# Patient Record
Sex: Female | Born: 1935 | Race: White | Hispanic: No | Smoking: Never smoker
Health system: Southern US, Community
[De-identification: ages and names within clinical notes are randomized; demographics above are authoritative.]

## PROBLEM LIST (undated history)

## (undated) ENCOUNTER — Encounter (HOSPITAL_COMMUNITY): Admission: RE | Payer: Medicare Other | Source: Skilled Nursing Facility | Admitting: *Deleted

## (undated) DIAGNOSIS — K222 Esophageal obstruction: Secondary | ICD-10-CM

## (undated) DIAGNOSIS — E785 Hyperlipidemia, unspecified: Secondary | ICD-10-CM

## (undated) DIAGNOSIS — K449 Diaphragmatic hernia without obstruction or gangrene: Secondary | ICD-10-CM

## (undated) DIAGNOSIS — E119 Type 2 diabetes mellitus without complications: Secondary | ICD-10-CM

## (undated) DIAGNOSIS — M1990 Unspecified osteoarthritis, unspecified site: Secondary | ICD-10-CM

## (undated) DIAGNOSIS — K210 Gastro-esophageal reflux disease with esophagitis: Secondary | ICD-10-CM

## (undated) DIAGNOSIS — D369 Benign neoplasm, unspecified site: Secondary | ICD-10-CM

## (undated) DIAGNOSIS — M810 Age-related osteoporosis without current pathological fracture: Secondary | ICD-10-CM

## (undated) DIAGNOSIS — I10 Essential (primary) hypertension: Secondary | ICD-10-CM

## (undated) HISTORY — PX: APPENDECTOMY: SHX54

## (undated) HISTORY — DX: Benign neoplasm, unspecified site: D36.9

## (undated) HISTORY — DX: Esophageal obstruction: K22.2

## (undated) HISTORY — DX: Diaphragmatic hernia without obstruction or gangrene: K44.9

## (undated) HISTORY — PX: ABDOMINAL HYSTERECTOMY: SHX81

## (undated) HISTORY — PX: BREAST BIOPSY: SHX20

## (undated) HISTORY — DX: Essential (primary) hypertension: I10

## (undated) HISTORY — PX: EYE SURGERY: SHX253

## (undated) HISTORY — DX: Age-related osteoporosis without current pathological fracture: M81.0

## (undated) HISTORY — PX: COLONOSCOPY: SHX174

## (undated) HISTORY — PX: HIP SURGERY: SHX245

## (undated) HISTORY — DX: Hyperlipidemia, unspecified: E78.5

## (undated) HISTORY — DX: Gastro-esophageal reflux disease with esophagitis: K21.0

## (undated) HISTORY — PX: RECTAL SURGERY: SHX760

## (undated) HISTORY — DX: Type 2 diabetes mellitus without complications: E11.9

## (undated) HISTORY — DX: Unspecified osteoarthritis, unspecified site: M19.90

---

## 2011-06-17 DIAGNOSIS — I10 Essential (primary) hypertension: Secondary | ICD-10-CM | POA: Diagnosis not present

## 2011-06-17 DIAGNOSIS — F411 Generalized anxiety disorder: Secondary | ICD-10-CM | POA: Diagnosis not present

## 2011-06-17 DIAGNOSIS — E785 Hyperlipidemia, unspecified: Secondary | ICD-10-CM | POA: Diagnosis not present

## 2011-06-17 DIAGNOSIS — E119 Type 2 diabetes mellitus without complications: Secondary | ICD-10-CM | POA: Diagnosis not present

## 2011-07-01 DIAGNOSIS — H353190 Nonexudative age-related macular degeneration, unspecified eye, stage unspecified: Secondary | ICD-10-CM | POA: Diagnosis not present

## 2011-07-01 DIAGNOSIS — H26499 Other secondary cataract, unspecified eye: Secondary | ICD-10-CM | POA: Diagnosis not present

## 2011-07-29 DIAGNOSIS — H26499 Other secondary cataract, unspecified eye: Secondary | ICD-10-CM | POA: Diagnosis not present

## 2011-08-06 DIAGNOSIS — E119 Type 2 diabetes mellitus without complications: Secondary | ICD-10-CM | POA: Diagnosis not present

## 2011-08-06 DIAGNOSIS — L0291 Cutaneous abscess, unspecified: Secondary | ICD-10-CM | POA: Diagnosis not present

## 2011-08-06 DIAGNOSIS — J019 Acute sinusitis, unspecified: Secondary | ICD-10-CM | POA: Diagnosis not present

## 2011-08-06 DIAGNOSIS — I10 Essential (primary) hypertension: Secondary | ICD-10-CM | POA: Diagnosis not present

## 2011-08-06 DIAGNOSIS — E785 Hyperlipidemia, unspecified: Secondary | ICD-10-CM | POA: Diagnosis not present

## 2011-08-06 DIAGNOSIS — R413 Other amnesia: Secondary | ICD-10-CM | POA: Diagnosis not present

## 2011-08-07 DIAGNOSIS — R413 Other amnesia: Secondary | ICD-10-CM | POA: Diagnosis not present

## 2011-08-12 DIAGNOSIS — H26499 Other secondary cataract, unspecified eye: Secondary | ICD-10-CM | POA: Diagnosis not present

## 2011-08-13 DIAGNOSIS — I10 Essential (primary) hypertension: Secondary | ICD-10-CM | POA: Diagnosis not present

## 2011-08-13 DIAGNOSIS — F411 Generalized anxiety disorder: Secondary | ICD-10-CM | POA: Diagnosis not present

## 2011-08-13 DIAGNOSIS — F0390 Unspecified dementia without behavioral disturbance: Secondary | ICD-10-CM | POA: Diagnosis not present

## 2011-09-02 DIAGNOSIS — Z79899 Other long term (current) drug therapy: Secondary | ICD-10-CM | POA: Diagnosis not present

## 2011-09-02 DIAGNOSIS — M353 Polymyalgia rheumatica: Secondary | ICD-10-CM | POA: Diagnosis not present

## 2011-09-02 DIAGNOSIS — E119 Type 2 diabetes mellitus without complications: Secondary | ICD-10-CM | POA: Diagnosis not present

## 2011-09-02 DIAGNOSIS — E782 Mixed hyperlipidemia: Secondary | ICD-10-CM | POA: Diagnosis not present

## 2011-09-02 DIAGNOSIS — E1142 Type 2 diabetes mellitus with diabetic polyneuropathy: Secondary | ICD-10-CM | POA: Diagnosis not present

## 2011-09-02 DIAGNOSIS — E559 Vitamin D deficiency, unspecified: Secondary | ICD-10-CM | POA: Diagnosis not present

## 2011-09-02 DIAGNOSIS — R5381 Other malaise: Secondary | ICD-10-CM | POA: Diagnosis not present

## 2011-09-14 DIAGNOSIS — Z79899 Other long term (current) drug therapy: Secondary | ICD-10-CM | POA: Diagnosis not present

## 2011-11-09 DIAGNOSIS — E119 Type 2 diabetes mellitus without complications: Secondary | ICD-10-CM | POA: Diagnosis not present

## 2011-11-09 DIAGNOSIS — I10 Essential (primary) hypertension: Secondary | ICD-10-CM | POA: Diagnosis not present

## 2011-11-09 DIAGNOSIS — N390 Urinary tract infection, site not specified: Secondary | ICD-10-CM | POA: Diagnosis not present

## 2011-11-09 DIAGNOSIS — I119 Hypertensive heart disease without heart failure: Secondary | ICD-10-CM | POA: Diagnosis not present

## 2011-11-09 DIAGNOSIS — R609 Edema, unspecified: Secondary | ICD-10-CM | POA: Diagnosis not present

## 2011-11-09 DIAGNOSIS — I509 Heart failure, unspecified: Secondary | ICD-10-CM | POA: Diagnosis not present

## 2011-11-11 DIAGNOSIS — H40019 Open angle with borderline findings, low risk, unspecified eye: Secondary | ICD-10-CM | POA: Diagnosis not present

## 2011-11-18 DIAGNOSIS — I119 Hypertensive heart disease without heart failure: Secondary | ICD-10-CM | POA: Diagnosis not present

## 2011-11-18 DIAGNOSIS — I6529 Occlusion and stenosis of unspecified carotid artery: Secondary | ICD-10-CM | POA: Diagnosis not present

## 2011-11-18 DIAGNOSIS — R079 Chest pain, unspecified: Secondary | ICD-10-CM | POA: Diagnosis not present

## 2011-11-18 DIAGNOSIS — I080 Rheumatic disorders of both mitral and aortic valves: Secondary | ICD-10-CM | POA: Diagnosis not present

## 2011-11-18 DIAGNOSIS — R269 Unspecified abnormalities of gait and mobility: Secondary | ICD-10-CM | POA: Diagnosis not present

## 2011-11-18 DIAGNOSIS — I10 Essential (primary) hypertension: Secondary | ICD-10-CM | POA: Diagnosis not present

## 2012-01-28 DIAGNOSIS — E785 Hyperlipidemia, unspecified: Secondary | ICD-10-CM | POA: Diagnosis not present

## 2012-01-28 DIAGNOSIS — M810 Age-related osteoporosis without current pathological fracture: Secondary | ICD-10-CM | POA: Diagnosis not present

## 2012-01-28 DIAGNOSIS — E119 Type 2 diabetes mellitus without complications: Secondary | ICD-10-CM | POA: Diagnosis not present

## 2012-01-28 DIAGNOSIS — I10 Essential (primary) hypertension: Secondary | ICD-10-CM | POA: Diagnosis not present

## 2012-02-25 DIAGNOSIS — E119 Type 2 diabetes mellitus without complications: Secondary | ICD-10-CM | POA: Diagnosis not present

## 2012-02-25 DIAGNOSIS — I10 Essential (primary) hypertension: Secondary | ICD-10-CM | POA: Diagnosis not present

## 2012-03-31 DIAGNOSIS — E119 Type 2 diabetes mellitus without complications: Secondary | ICD-10-CM | POA: Diagnosis not present

## 2012-03-31 DIAGNOSIS — E1149 Type 2 diabetes mellitus with other diabetic neurological complication: Secondary | ICD-10-CM | POA: Diagnosis not present

## 2012-05-18 DIAGNOSIS — K2100 Gastro-esophageal reflux disease with esophagitis, without bleeding: Secondary | ICD-10-CM

## 2012-05-18 DIAGNOSIS — K222 Esophageal obstruction: Secondary | ICD-10-CM

## 2012-05-18 DIAGNOSIS — K449 Diaphragmatic hernia without obstruction or gangrene: Secondary | ICD-10-CM

## 2012-05-18 HISTORY — DX: Gastro-esophageal reflux disease with esophagitis, without bleeding: K21.00

## 2012-05-18 HISTORY — DX: Esophageal obstruction: K22.2

## 2012-05-18 HISTORY — DX: Diaphragmatic hernia without obstruction or gangrene: K44.9

## 2012-06-23 DIAGNOSIS — E119 Type 2 diabetes mellitus without complications: Secondary | ICD-10-CM | POA: Diagnosis not present

## 2012-06-23 DIAGNOSIS — E1149 Type 2 diabetes mellitus with other diabetic neurological complication: Secondary | ICD-10-CM | POA: Diagnosis not present

## 2012-07-07 DIAGNOSIS — I10 Essential (primary) hypertension: Secondary | ICD-10-CM | POA: Diagnosis not present

## 2012-07-07 DIAGNOSIS — M25559 Pain in unspecified hip: Secondary | ICD-10-CM | POA: Diagnosis not present

## 2012-07-07 DIAGNOSIS — E119 Type 2 diabetes mellitus without complications: Secondary | ICD-10-CM | POA: Diagnosis not present

## 2012-07-07 DIAGNOSIS — E785 Hyperlipidemia, unspecified: Secondary | ICD-10-CM | POA: Diagnosis not present

## 2012-07-26 ENCOUNTER — Encounter: Payer: Self-pay | Admitting: *Deleted

## 2012-07-26 DIAGNOSIS — E785 Hyperlipidemia, unspecified: Secondary | ICD-10-CM | POA: Insufficient documentation

## 2012-07-26 DIAGNOSIS — E119 Type 2 diabetes mellitus without complications: Secondary | ICD-10-CM | POA: Insufficient documentation

## 2012-07-26 DIAGNOSIS — I10 Essential (primary) hypertension: Secondary | ICD-10-CM | POA: Insufficient documentation

## 2012-07-27 ENCOUNTER — Ambulatory Visit (INDEPENDENT_AMBULATORY_CARE_PROVIDER_SITE_OTHER): Payer: Medicare Other | Admitting: Cardiovascular Disease

## 2012-07-27 ENCOUNTER — Encounter: Payer: Self-pay | Admitting: Cardiovascular Disease

## 2012-07-27 VITALS — BP 148/70 | HR 84 | Ht 59.0 in | Wt 182.0 lb

## 2012-07-27 DIAGNOSIS — E785 Hyperlipidemia, unspecified: Secondary | ICD-10-CM

## 2012-07-27 DIAGNOSIS — E119 Type 2 diabetes mellitus without complications: Secondary | ICD-10-CM

## 2012-07-27 DIAGNOSIS — R079 Chest pain, unspecified: Secondary | ICD-10-CM | POA: Diagnosis not present

## 2012-07-27 DIAGNOSIS — I10 Essential (primary) hypertension: Secondary | ICD-10-CM | POA: Diagnosis not present

## 2012-07-27 NOTE — Assessment & Plan Note (Signed)
Atypical Normal ECG Resolved Observe for now Nitro called in If she has recurrent pain can order [employer]

## 2012-07-27 NOTE — Patient Instructions (Addendum)
Your physician recommends that you schedule a follow-up appointment in: 3 months.  

## 2012-07-27 NOTE — Assessment & Plan Note (Signed)
Cholesterol is at goal.  Continue current dose of statin and diet Rx.  No myalgias or side effects.  F/U  LFT's in 6 months. No results found for this basename: LDLCALC             

## 2012-07-27 NOTE — Assessment & Plan Note (Signed)
Well controlled.  Continue current medications and low sodium Dash type diet.    

## 2012-07-27 NOTE — Assessment & Plan Note (Signed)
Discussed low carb diet.  Target hemoglobin A1c is 6.5 or less.  Continue current medications.  

## 2012-07-27 NOTE — Progress Notes (Signed)
Patient ID: Amanda Hale, female   DOB: Mar 09, 1926, 79 y.o.   MRN: [id number] 79 yo referred by Dr Margo Aye for chest pain.  CRF;s DM , elevated lipids and HTN.  2/20 seen in office and complained of atypical chest pain.  Pain at rest and with recumbency.  Had it for about 5 nights in a row.  Nothing the last 3 weeks On dexilant now.  Difficulty ambulating due to two previous right hip surgeries. Compliant with meds.  Has not had nitro No pain during day or with exertion.  Last LDL 2/14 89 and A1c was 6.3  ROS: Denies fever, malais, weight loss, blurry vision, decreased visual acuity, cough, sputum, SOB, hemoptysis, pleuritic pain, palpitaitons, heartburn, abdominal pain, melena, lower extremity edema, claudication, or rash.  All other systems reviewed and negative   General: Affect appropriate Obese white female HEENT: normal Neck supple with no adenopathy JVP normal no bruits no thyromegaly Lungs clear with no wheezing and good diaphragmatic motion Heart:  S1/S2 no murmur,rub, gallop or click PMI normal Abdomen: benighn, BS positve, no tenderness, no AAA no bruit.  No HSM or HJR Distal pulses intact with no bruits No edema Neuro non-focal Skin warm and dry No muscular weakness  Medications Current Outpatient Prescriptions  Medication Sig Dispense Refill  . bumetanide (BUMEX) 1 MG tablet Take 1 mg by mouth daily.      Jennette Banker Sodium 30-100 MG CAPS Take 1 capsule by mouth as needed.      . citalopram (CELEXA) 20 MG tablet Take 20 mg by mouth daily.      Marland Kitchen gabapentin (NEURONTIN) 300 MG capsule Take 300 mg by mouth 3 (three) times daily.      Marland Kitchen glyBURIDE (DIABETA) 5 MG tablet Take 2.5 mg by mouth daily with breakfast.      . lovastatin (MEVACOR) 40 MG tablet Take 40 mg by mouth at bedtime.      . meclizine (ANTIVERT) 25 MG tablet Take 25 mg by mouth 4 (four) times daily as needed.      . metFORMIN (GLUCOPHAGE-XR) 500 MG 24 hr tablet Take 500 mg by mouth 2 (two) times  daily.      . traMADol (ULTRAM) 50 MG tablet Take 50 mg by mouth every 6 (six) hours as needed for pain.      . Vitamin D, Ergocalciferol, (DRISDOL) 50000 UNITS CAPS Take 50,000 Units by mouth every 7 (seven) days.       No current facility-administered medications for this visit.    Allergies Review of patient's allergies indicates no known allergies.  Family History: Family History  Problem Relation Age of Onset  . Hypertension Mother   . Hypertension Son     Social History: History   Social History  . Marital Status: Unknown    Spouse Name: N/A    Number of Children: N/A  . Years of Education: N/A   Occupational History  . Not on file.   Social History Main Topics  . Smoking status: Never Smoker   . Smokeless tobacco: Not on file  . Alcohol Use: No  . Drug Use: No  . Sexually Active: Not on file   Other Topics Concern  . Not on file   Social History Narrative  . No narrative on file    Electrocardiogram: NSR rate 80 low voltage otherwise normal  Assessment and Plan

## 2012-08-11 ENCOUNTER — Encounter: Payer: Self-pay | Admitting: Gastroenterology

## 2012-08-11 ENCOUNTER — Ambulatory Visit (INDEPENDENT_AMBULATORY_CARE_PROVIDER_SITE_OTHER): Payer: Medicare Other | Admitting: Gastroenterology

## 2012-08-11 ENCOUNTER — Encounter: Payer: Self-pay | Admitting: Internal Medicine

## 2012-08-11 VITALS — BP 144/67 | HR 83 | Temp 98.0°F | Ht 59.0 in | Wt 182.2 lb

## 2012-08-11 DIAGNOSIS — R1314 Dysphagia, pharyngoesophageal phase: Secondary | ICD-10-CM

## 2012-08-11 DIAGNOSIS — Z860100 Personal history of colon polyps, unspecified: Secondary | ICD-10-CM

## 2012-08-11 DIAGNOSIS — R1319 Other dysphagia: Secondary | ICD-10-CM

## 2012-08-11 DIAGNOSIS — K5909 Other constipation: Secondary | ICD-10-CM | POA: Insufficient documentation

## 2012-08-11 DIAGNOSIS — K5900 Constipation, unspecified: Secondary | ICD-10-CM | POA: Diagnosis not present

## 2012-08-11 DIAGNOSIS — R1310 Dysphagia, unspecified: Secondary | ICD-10-CM

## 2012-08-11 MED ORDER — PEG 3350-KCL-NA BICARB-NACL 420 G PO SOLR: 4000.0000 mL | ORAL | Status: DC

## 2012-08-11 MED ORDER — POLYETHYLENE GLYCOL 3350 17 GM/SCOOP PO POWD
17.0000 g | Freq: Every day | ORAL | Status: DC
Start: 2012-08-11 — End: 2015-08-07

## 2012-08-11 NOTE — Assessment & Plan Note (Signed)
Colonoscopy in the near future.  I have discussed the risks, alternatives, benefits with regards to but not limited to the risk of reaction to medication, bleeding, infection, perforation and the patient is agreeable to proceed. Written consent to be obtained.  Patient to provide [place] with her surgeon's contact information so we can request copy of rectal surgery records.

## 2012-08-11 NOTE — Progress Notes (Signed)
CC PCP 

## 2012-08-11 NOTE — Patient Instructions (Signed)
We have scheduled you for a colonoscopy and upper endoscopy with Dr. Jena Gauss. Please see separate instructions. Please try MiraLax one capful every day to keep your bowel movements regular and soft. Prescription has been sent to your pharmacy however your insurance may require you to buy over-the-counter instead.

## 2012-08-11 NOTE — Assessment & Plan Note (Signed)
MiraLax 17 g daily.

## 2012-08-11 NOTE — Progress Notes (Signed)
Primary Care Physician:  HALL,ZACK, MD  Primary Gastroenterologist:  Michael [name], MD   Chief Complaint  Patient presents with  . Colonoscopy    HPI:  Amanda Hale is a 79 y.o. female here for consideration of a colonoscopy given the personal history of colon polyps. Patient states her last colonoscopy was about 12 years ago in [place]. She reports having multiple colonoscopies and having polyps almost every time. She also reports having some sort of surgery on her rectum. She states she had had surgery after having hemorrhoid surgery and had some sort of "grafting" done. She denies any history of cancer.  Patient complains of chronic constipation. She has taken over-the-counter laxatives on a regular basis. She generally alternates what she takes. Has a bowel movement every 2-3 days. Little bit of heartburn related to spicey foods. Complains of difficulty swallowing pupils, meat.   Current Outpatient Prescriptions  Medication Sig Dispense Refill  . bumetanide (BUMEX) 1 MG tablet Take 1 mg by mouth daily.      . Casanthranol-Docusate Sodium 30-100 MG CAPS Take 1 capsule by mouth as needed.      . citalopram (CELEXA) 20 MG tablet Take 20 mg by mouth daily.      . gabapentin (NEURONTIN) 300 MG capsule Take 300 mg by mouth 3 (three) times daily.      . glyBURIDE (DIABETA) 5 MG tablet Take 2.5 mg by mouth daily with breakfast.      . lovastatin (MEVACOR) 40 MG tablet Take 40 mg by mouth at bedtime.      . meclizine (ANTIVERT) 25 MG tablet Take 25 mg by mouth 4 (four) times daily as needed.      . metFORMIN (GLUCOPHAGE-XR) 500 MG 24 hr tablet Take 500 mg by mouth 2 (two) times daily.      . traMADol (ULTRAM) 50 MG tablet Take 50 mg by mouth every 6 (six) hours as needed for pain.      . Vitamin D, Ergocalciferol, (DRISDOL) 50000 UNITS CAPS Take 50,000 Units by mouth every 7 (seven) days.      . polyethylene glycol powder (GLYCOLAX/MIRALAX) powder Take 17 g by mouth daily.  527 g  11  .  polyethylene glycol-electrolytes (TRILYTE) 420 G solution Take 4,000 mLs by mouth as directed.  4000 mL  0   No current facility-administered medications for this visit.    Allergies as of 08/11/2012  . (No Known Allergies)    Past Medical History  Diagnosis Date  . HTN (hypertension)   . Dyslipidemia   . DM type 2 (diabetes mellitus, type 2)     Past Surgical History  Procedure Laterality Date  . Abdominal hysterectomy    . Rectal surgery      patient states "grafting" related to hemorrhoid surgery???  . Breast biopsy    . Eye surgery      both  . Hip surgery      right x 2  . Appendectomy      Family History  Problem Relation Age of Onset  . Hypertension Mother   . Hypertension Son   . Colon cancer Neg Hx     History   Social History  . Marital Status: Unknown    Spouse Name: N/A    Number of Children: N/A  . Years of Education: N/A   Occupational History  . Not on file.   Social History Main Topics  . Smoking status: Never Smoker   . Smokeless tobacco: Not on file  .   Alcohol Use: No  . Drug Use: No  . Sexually Active: Not on file   Other Topics Concern  . Not on file   Social History Narrative  . No narrative on file      ROS:  General: Negative for anorexia, weight loss, fever, chills, fatigue, weakness. Eyes: Negative for vision changes.  ENT: Negative for hoarseness, nasal congestion. CV: Negative for chest pain, angina, palpitations, dyspnea on exertion, peripheral edema.  Respiratory: Negative for dyspnea at rest, dyspnea on exertion, cough, sputum, wheezing.  GI: See history of present illness. GU:  Negative for dysuria, hematuria, urinary incontinence, urinary frequency, nocturnal urination.  MS: chronic knee and hip pain, negative for low back pain.  Derm: Negative for rash or itching.  Neuro: Negative for weakness, abnormal sensation, seizure, frequent headaches, memory loss, confusion.  Psych: Negative for anxiety, depression,  suicidal ideation, hallucinations.  Endo: Negative for unusual weight change.  Heme: Negative for bruising or bleeding. Allergy: Negative for rash or hives.    Physical Examination:  BP 144/67  Pulse 83  Temp(Src) 98 F (36.7 C) (Oral)  Ht 4' 11" (1.499 m)  Wt 182 lb 3.2 oz (82.645 kg)  BMI 36.78 kg/m2   General: Well-nourished, well-developed in no acute distress. Hard of hearing. Accompanied by son. Head: Normocephalic, atraumatic.   Eyes: Conjunctiva pink, no icterus. Mouth: Oropharyngeal mucosa moist and pink , no lesions erythema or exudate. Neck: Supple without thyromegaly, masses, or lymphadenopathy.  Lungs: Clear to auscultation bilaterally.  Heart: Regular rate and rhythm, no murmurs rubs or gallops.  Abdomen: Bowel sounds are normal, nontender, nondistended, no hepatosplenomegaly or masses, no abdominal bruits or hernia , no rebound or guarding.   Rectal: several small skin tags noted. Normal tone. Brown stool Hemoccult negative. Extremities: 1+ pitting edema in lower extremities. No clubbing or deformities.  Neuro: Alert and oriented x 4 , grossly normal neurologically.  Skin: Warm and dry, no rash or jaundice.   Psych: Alert and cooperative, normal mood and affect.  Labs: Labs from February 2014. Sodium 139, potassium 4.1, glucose 130, BUN 14, creatinine 0.8, total bilirubin 0.6, alkaline phosphatase 75, AST 18, ALT 9, albumin 3.9, calcium 9.3.  Imaging Studies: No results found.    

## 2012-08-11 NOTE — Assessment & Plan Note (Signed)
Esophageal dysphagia to solid foods, especially meat. Offered upper endoscopy with dilation at time of colonoscopy.  I have discussed the risks, alternatives, benefits with regards to but not limited to the risk of reaction to medication, bleeding, infection, perforation and the patient is agreeable to proceed. Written consent to be obtained.

## 2012-08-12 LAB — COMPREHENSIVE METABOLIC PANEL
Alkaline Phosphatase: 75 U/L
BUN: 14 mg/dL (ref 4–21)
Calcium: 9.3 mg/dL
Glucose: 130
Potassium: 4.1 mmol/L
Sodium: 139 mmol/L (ref 137–147)

## 2012-08-16 ENCOUNTER — Telehealth: Payer: Self-pay | Admitting: Internal Medicine

## 2012-08-16 ENCOUNTER — Other Ambulatory Visit: Payer: Self-pay | Admitting: Internal Medicine

## 2012-08-16 DIAGNOSIS — Z961 Presence of intraocular lens: Secondary | ICD-10-CM | POA: Diagnosis not present

## 2012-08-16 DIAGNOSIS — R1310 Dysphagia, unspecified: Secondary | ICD-10-CM

## 2012-08-16 DIAGNOSIS — Z86010 Personal history of colonic polyps: Secondary | ICD-10-CM

## 2012-08-16 DIAGNOSIS — E119 Type 2 diabetes mellitus without complications: Secondary | ICD-10-CM | POA: Diagnosis not present

## 2012-08-16 DIAGNOSIS — K5900 Constipation, unspecified: Secondary | ICD-10-CM

## 2012-08-16 NOTE — Telephone Encounter (Signed)
I spoke to Mr. Amanda Hale in regards to moving Mrs. Amanda Hale procedure appointment to 9:45 on 04/24 due to an add on OR case and he agreed and  understood

## 2012-08-17 ENCOUNTER — Telehealth: Payer: Self-pay | Admitting: Internal Medicine

## 2012-08-17 NOTE — Telephone Encounter (Signed)
Pt is scheduled for a tcs with RMR on 4/24 and has numerous questions about her prep prescription. She can be reached at [phone]  She also signed a release of info while she was here to retrieve some records from [place]. I have not seen anything come back as of yet unless it has already been taken off the fax for the provider.

## 2012-08-17 NOTE — Telephone Encounter (Signed)
Spoke to patient and explained prep instructions again

## 2012-08-25 DIAGNOSIS — E119 Type 2 diabetes mellitus without complications: Secondary | ICD-10-CM | POA: Diagnosis not present

## 2012-08-25 DIAGNOSIS — E1149 Type 2 diabetes mellitus with other diabetic neurological complication: Secondary | ICD-10-CM | POA: Diagnosis not present

## 2012-08-25 DIAGNOSIS — H918X9 Other specified hearing loss, unspecified ear: Secondary | ICD-10-CM | POA: Diagnosis not present

## 2012-08-26 ENCOUNTER — Encounter (HOSPITAL_COMMUNITY): Payer: Self-pay | Admitting: Pharmacy Technician

## 2012-09-08 ENCOUNTER — Encounter (HOSPITAL_COMMUNITY)
Admission: RE | Disposition: A | Discharge status: Home / Self Care | Payer: Self-pay | Source: Ambulatory Visit | Attending: Internal Medicine

## 2012-09-08 ENCOUNTER — Ambulatory Visit (HOSPITAL_COMMUNITY)
Admission: RE | Admit: 2012-09-08 | Discharge: 2012-09-08 | Disposition: A | Discharge status: Home / Self Care | Payer: Medicare Other | Source: Ambulatory Visit | Attending: Internal Medicine | Admitting: Internal Medicine

## 2012-09-08 ENCOUNTER — Ambulatory Visit: Admit: 2012-09-08 | Payer: Self-pay | Admitting: Internal Medicine

## 2012-09-08 ENCOUNTER — Encounter (HOSPITAL_COMMUNITY): Payer: Self-pay

## 2012-09-08 DIAGNOSIS — Z860100 Personal history of colon polyps, unspecified: Secondary | ICD-10-CM | POA: Insufficient documentation

## 2012-09-08 DIAGNOSIS — K2970 Gastritis, unspecified, without bleeding: Secondary | ICD-10-CM | POA: Diagnosis not present

## 2012-09-08 DIAGNOSIS — K219 Gastro-esophageal reflux disease without esophagitis: Secondary | ICD-10-CM | POA: Insufficient documentation

## 2012-09-08 DIAGNOSIS — E119 Type 2 diabetes mellitus without complications: Secondary | ICD-10-CM | POA: Insufficient documentation

## 2012-09-08 DIAGNOSIS — Z01812 Encounter for preprocedural laboratory examination: Secondary | ICD-10-CM | POA: Insufficient documentation

## 2012-09-08 DIAGNOSIS — Z1211 Encounter for screening for malignant neoplasm of colon: Secondary | ICD-10-CM | POA: Diagnosis not present

## 2012-09-08 DIAGNOSIS — I10 Essential (primary) hypertension: Secondary | ICD-10-CM | POA: Insufficient documentation

## 2012-09-08 DIAGNOSIS — K222 Esophageal obstruction: Secondary | ICD-10-CM | POA: Insufficient documentation

## 2012-09-08 DIAGNOSIS — K449 Diaphragmatic hernia without obstruction or gangrene: Secondary | ICD-10-CM | POA: Diagnosis not present

## 2012-09-08 DIAGNOSIS — K2960 Other gastritis without bleeding: Secondary | ICD-10-CM

## 2012-09-08 DIAGNOSIS — D126 Benign neoplasm of colon, unspecified: Secondary | ICD-10-CM | POA: Diagnosis not present

## 2012-09-08 DIAGNOSIS — K2990 Gastroduodenitis, unspecified, without bleeding: Secondary | ICD-10-CM | POA: Diagnosis not present

## 2012-09-08 DIAGNOSIS — R1310 Dysphagia, unspecified: Secondary | ICD-10-CM

## 2012-09-08 DIAGNOSIS — K5900 Constipation, unspecified: Secondary | ICD-10-CM

## 2012-09-08 DIAGNOSIS — K210 Gastro-esophageal reflux disease with esophagitis: Secondary | ICD-10-CM

## 2012-09-08 HISTORY — PX: ESOPHAGOGASTRODUODENOSCOPY (EGD) WITH ESOPHAGEAL DILATION: SHX5812

## 2012-09-08 HISTORY — PX: COLONOSCOPY: SHX5424

## 2012-09-08 SURGERY — COLONOSCOPY
Anesthesia: Moderate Sedation

## 2012-09-08 MED ORDER — MIDAZOLAM HCL 5 MG/5ML IJ SOLN
INTRAMUSCULAR | Status: AC
  Filled 2012-09-08: qty 10

## 2012-09-08 MED ORDER — MIDAZOLAM HCL 5 MG/5ML IJ SOLN
INTRAMUSCULAR | Status: DC | PRN
  Administered 2012-09-08: 2 mg via INTRAVENOUS
  Administered 2012-09-08 (×6): 1 mg via INTRAVENOUS
  Administered 2012-09-08: 2 mg via INTRAVENOUS

## 2012-09-08 MED ORDER — MEPERIDINE HCL 100 MG/ML IJ SOLN
INTRAMUSCULAR | Status: DC | PRN
  Administered 2012-09-08 (×2): 25 mg via INTRAVENOUS
  Administered 2012-09-08: 50 mg via INTRAVENOUS
  Administered 2012-09-08 (×3): 25 mg via INTRAVENOUS

## 2012-09-08 MED ORDER — STERILE WATER FOR IRRIGATION IR SOLN
Status: DC | PRN
  Administered 2012-09-08: 10:00:00

## 2012-09-08 MED ORDER — MEPERIDINE HCL 100 MG/ML IJ SOLN
INTRAMUSCULAR | Status: AC
  Filled 2012-09-08: qty 1

## 2012-09-08 MED ORDER — ONDANSETRON HCL 4 MG/2ML IJ SOLN
INTRAMUSCULAR | Status: AC
  Filled 2012-09-08: qty 2

## 2012-09-08 MED ORDER — ONDANSETRON HCL 4 MG/2ML IJ SOLN
INTRAMUSCULAR | Status: DC | PRN
  Administered 2012-09-08: 4 mg via INTRAVENOUS

## 2012-09-08 MED ORDER — BUTAMBEN-TETRACAINE-BENZOCAINE 2-2-14 % EX AERO
INHALATION_SPRAY | CUTANEOUS | Status: DC | PRN
  Administered 2012-09-08: 2 via TOPICAL

## 2012-09-08 MED ORDER — SODIUM CHLORIDE 0.9 % IV SOLN
INTRAVENOUS | Status: DC
  Administered 2012-09-08: 09:00:00 via INTRAVENOUS

## 2012-09-08 NOTE — Op Note (Signed)
[facility] [address] [place], [address]   ENDOSCOPY PROCEDURE REPORT  PATIENT: Amanda Hale, Amanda Hale  MR#: [id number] BIRTHDATE: 28-Aug-1925 , 87  yrs. old GENDER: Female ENDOSCOPIST: R.  Roetta Sessions, MD FACP FACG REFERRED BY:  Catalina Pizza, M.D. PROCEDURE DATE:  09/08/2012 PROCEDURE:     EGD with Elease Hashimoto dilation gastric biopsy  INDICATIONS:     Esophageal dysphagia ; GERD  INFORMED CONSENT:   The risks, benefits, limitations, alternatives and imponderables have been discussed.  The potential for biopsy, esophogeal dilation, etc. have also been reviewed.  Questions have been answered.  All parties agreeable.  Please see the history and physical in the medical record for more information.  MEDICATIONS:    Versed 7 mg IV and Demerol 100 mg IV in divided doses. Zofran 4 mg IV. Cetacaine spray.  DESCRIPTION OF PROCEDURE:   The EG-[id number] ([id number])  endoscope was introduced through the mouth and advanced to the second portion of the duodenum without difficulty or limitations.  The mucosal surfaces were surveyed very carefully during advancement of the scope and upon withdrawal.  Retroflexion view of the proximal stomach and esophagogastric junction was performed.      FINDINGS: Prominent Schatzki's ring with single overlying distal esophageal erosion. No Barrett's esophagus. Stomach empty. 3 cm hiatal hernia. Multiple antral erosions. Deformity and some stenosis of the pyloric channel. No ulcer or infiltrating process. Pylorus dilated with passage of scope. Examination of the bulb revealed multiple erosions. No ulcer. The second portion appeared normal.  THERAPEUTIC / DIAGNOSTIC MANEUVERS PERFORMED:  A 54 French Maloney dilator was passed to full insertion easily. A look back revealed nice  disruption of the ring with a superficial tear through the UES mucosa as well. Subsequently, biopsies of the abnormal gastric mucosa taken for histologic  study   COMPLICATIONS:  None  IMPRESSION:     Schatzki's ring status post dilation. Erosive reflux esophagitis. Hiatal hernia. Gastric and bulbar erosions with mild pyloric stenosis-status post gastric biopsy  RECOMMENDATIONS:   Begin Protonix 40 mg daily. Followup on pathology. See EGD report. Chloraseptic as needed for a sore throat over the next couple of days.    _______________________________ R. Roetta Sessions, MD FACP [facility] eSigned:  R. Roetta Sessions, MD FACP [facility] 09/08/2012 10:52 AM     CC:  PATIENT NAME:  Anora, Schwenke MR#: [id number]

## 2012-09-08 NOTE — OR Nursing (Signed)
Patient voiced concern about taking an antibiotic before her procedure because she has had two hip surgery's.  Spoke with Dr. Jena Gauss about concerns and told patient his recommendations for this procedure. Patient was satisfied with his response

## 2012-09-08 NOTE — H&P (View-Only) (Signed)
Primary Care Physician:  Dwana Melena, MD  Primary Gastroenterologist:  Roetta Sessions, MD   Chief Complaint  Patient presents with  . Colonoscopy    HPI:  Amanda Hale is a 79 y.o. female here for consideration of a colonoscopy given the personal history of colon polyps. Patient states her last colonoscopy was about 12 years ago in [place]. She reports having multiple colonoscopies and having polyps almost every time. She also reports having some sort of surgery on her rectum. She states she had had surgery after having hemorrhoid surgery and had some sort of "grafting" done. She denies any history of cancer.  Patient complains of chronic constipation. She has taken over-the-counter laxatives on a regular basis. She generally alternates what she takes. Has a bowel movement every 2-3 days. Little bit of heartburn related to spicey foods. Complains of difficulty swallowing pupils, meat.   Current Outpatient Prescriptions  Medication Sig Dispense Refill  . bumetanide (BUMEX) 1 MG tablet Take 1 mg by mouth daily.      Jennette Banker Sodium 30-100 MG CAPS Take 1 capsule by mouth as needed.      . citalopram (CELEXA) 20 MG tablet Take 20 mg by mouth daily.      Marland Kitchen gabapentin (NEURONTIN) 300 MG capsule Take 300 mg by mouth 3 (three) times daily.      Marland Kitchen glyBURIDE (DIABETA) 5 MG tablet Take 2.5 mg by mouth daily with breakfast.      . lovastatin (MEVACOR) 40 MG tablet Take 40 mg by mouth at bedtime.      . meclizine (ANTIVERT) 25 MG tablet Take 25 mg by mouth 4 (four) times daily as needed.      . metFORMIN (GLUCOPHAGE-XR) 500 MG 24 hr tablet Take 500 mg by mouth 2 (two) times daily.      . traMADol (ULTRAM) 50 MG tablet Take 50 mg by mouth every 6 (six) hours as needed for pain.      . Vitamin D, Ergocalciferol, (DRISDOL) 50000 UNITS CAPS Take 50,000 Units by mouth every 7 (seven) days.      . polyethylene glycol powder (GLYCOLAX/MIRALAX) powder Take 17 g by mouth daily.  527 g  11  .  polyethylene glycol-electrolytes (TRILYTE) 420 G solution Take 4,000 mLs by mouth as directed.  4000 mL  0   No current facility-administered medications for this visit.    Allergies as of 08/11/2012  . (No Known Allergies)    Past Medical History  Diagnosis Date  . HTN (hypertension)   . Dyslipidemia   . DM type 2 (diabetes mellitus, type 2)     Past Surgical History  Procedure Laterality Date  . Abdominal hysterectomy    . Rectal surgery      patient states "grafting" related to hemorrhoid surgery???  . Breast biopsy    . Eye surgery      both  . Hip surgery      right x 2  . Appendectomy      Family History  Problem Relation Age of Onset  . Hypertension Mother   . Hypertension Son   . Colon cancer Neg Hx     History   Social History  . Marital Status: Unknown    Spouse Name: N/A    Number of Children: N/A  . Years of Education: N/A   Occupational History  . Not on file.   Social History Main Topics  . Smoking status: Never Smoker   . Smokeless tobacco: Not on file  .  Alcohol Use: No  . Drug Use: No  . Sexually Active: Not on file   Other Topics Concern  . Not on file   Social History Narrative  . No narrative on file      ROS:  General: Negative for anorexia, weight loss, fever, chills, fatigue, weakness. Eyes: Negative for vision changes.  ENT: Negative for hoarseness, nasal congestion. CV: Negative for chest pain, angina, palpitations, dyspnea on exertion, peripheral edema.  Respiratory: Negative for dyspnea at rest, dyspnea on exertion, cough, sputum, wheezing.  GI: See history of present illness. GU:  Negative for dysuria, hematuria, urinary incontinence, urinary frequency, nocturnal urination.  MS: chronic knee and hip pain, negative for low back pain.  Derm: Negative for rash or itching.  Neuro: Negative for weakness, abnormal sensation, seizure, frequent headaches, memory loss, confusion.  Psych: Negative for anxiety, depression,  suicidal ideation, hallucinations.  Endo: Negative for unusual weight change.  Heme: Negative for bruising or bleeding. Allergy: Negative for rash or hives.    Physical Examination:  BP 144/67  Pulse 83  Temp(Src) 98 F (36.7 C) (Oral)  Ht 4\' 11"  (1.499 m)  Wt 182 lb 3.2 oz (82.645 kg)  BMI 36.78 kg/m2   General: Well-nourished, well-developed in no acute distress. Hard of hearing. Accompanied by son. Head: Normocephalic, atraumatic.   Eyes: Conjunctiva pink, no icterus. Mouth: Oropharyngeal mucosa moist and pink , no lesions erythema or exudate. Neck: Supple without thyromegaly, masses, or lymphadenopathy.  Lungs: Clear to auscultation bilaterally.  Heart: Regular rate and rhythm, no murmurs rubs or gallops.  Abdomen: Bowel sounds are normal, nontender, nondistended, no hepatosplenomegaly or masses, no abdominal bruits or hernia , no rebound or guarding.   Rectal: several small skin tags noted. Normal tone. Brown stool Hemoccult negative. Extremities: 1+ pitting edema in lower extremities. No clubbing or deformities.  Neuro: Alert and oriented x 4 , grossly normal neurologically.  Skin: Warm and dry, no rash or jaundice.   Psych: Alert and cooperative, normal mood and affect.  Labs: Labs from February 2014. Sodium 139, potassium 4.1, glucose 130, BUN 14, creatinine 0.8, total bilirubin 0.6, alkaline phosphatase 75, AST 18, ALT 9, albumin 3.9, calcium 9.3.  Imaging Studies: No results found.

## 2012-09-08 NOTE — Interval H&P Note (Signed)
History and Physical Interval Note:  09/08/2012 10:04 AM  Amanda Hale  has presented today for surgery, with the diagnosis of ESOPHAGEAL DYSPHAGIA, CONSTIPATION AND PERSONAL HX OF COLONIC POLYPS  The various methods of treatment have been discussed with the patient and family. After consideration of risks, benefits and other options for treatment, the patient has consented to  Procedure(s) with comments: COLONOSCOPY (N/A) - 9:45 ESOPHAGOGASTRODUODENOSCOPY (EGD) WITH ESOPHAGEAL DILATION (N/A) as a surgical intervention .  The patient's history has been reviewed, patient examined, no change in status, stable for surgery.  I have reviewed the patient's chart and labs.  Questions were answered to the patient's satisfaction.     Amanda Hale  Patient reports reflux symptoms 2-3 times a week depending on dietary indiscretion. Also has pill and solid food dysphagia. Previously on acid suppression therapy but none past couple of years. No prior upper GI tract evaluation. Otherwise, no change. EGD with dilation as appropriate and colonoscopy now being performed.The risks, benefits, limitations, imponderables and alternatives regarding both EGD and colonoscopy have been reviewed with the patient. Questions have been answered. All parties agreeable.

## 2012-09-08 NOTE — Op Note (Signed)
[facility] [address] [place], [address]   COLONOSCOPY PROCEDURE REPORT  PATIENT: Amanda Hale, Amanda Hale  MR#:         [id number] BIRTHDATE: August 10, 1925 , 87  yrs. old GENDER: Female ENDOSCOPIST: R.  Roetta Sessions, MD FACP FACG REFERRED BY:  Catalina Pizza, M.D. PROCEDURE DATE:  09/08/2012 PROCEDURE:     Colonoscopy with biopsy  INDICATIONS: History colonic polyps in the distant past (elsewhere)  INFORMED CONSENT:  The risks, benefits, alternatives and imponderables including but not limited to bleeding, perforation as well as the possibility of a missed lesion have been reviewed.  The potential for biopsy, lesion removal, etc. have also been discussed.  Questions have been answered.  All parties agreeable. Please see the history and physical in the medical record for more information.  MEDICATIONS:   Versed 10 mg IV and Demerol 175 mg IV in divided doses. Zofran 4 mg daily.  DESCRIPTION OF PROCEDURE:  After a digital rectal exam was performed, the EC-[id number] ([id number])  colonoscope was advanced from the anus through the rectum and colon to the area of the cecum, ileocecal valve and appendiceal orifice.  The cecum was deeply intubated.  These structures were well-seen and photographed for the record.  From the level of the cecum and ileocecal valve, the scope was slowly and cautiously withdrawn.  The mucosal surfaces were carefully surveyed utilizing scope tip deflection to facilitate fold flattening as needed.  The scope was pulled down into the rectum where a thorough examination including retroflexion was performed.    FINDINGS:    Inadequate preparation.for polyp detection (patient's son states patient was noncompliant with prep instructions). Normal rectum. Over (1) 4 mm polyp in the descending colon; otherwise, the colonic is progressively normal. However poor prep may have obscured a small or flat lesions.  THERAPEUTIC / DIAGNOSTIC MANEUVERS PERFORMED:  The  ascending colon polyp was  cold biopsied/removed  COMPLICATIONS: none  CECAL WITHDRAWAL TIME:  14 minutes  IMPRESSION:  Colonic polyp-removed as described above. Inadequate preparation compromised examination  RECOMMENDATIONS:   Follow up on pathology.  See EGD report   _______________________________ eSigned:  R. Roetta Sessions, MD FACP [facility] 09/08/2012 11:47 AM   CC:

## 2012-09-09 ENCOUNTER — Encounter: Payer: Self-pay | Admitting: Internal Medicine

## 2012-09-12 ENCOUNTER — Encounter (HOSPITAL_COMMUNITY): Payer: Self-pay | Admitting: Internal Medicine

## 2012-09-22 ENCOUNTER — Ambulatory Visit (INDEPENDENT_AMBULATORY_CARE_PROVIDER_SITE_OTHER): Payer: Medicare Other | Admitting: Otolaryngology

## 2012-09-22 DIAGNOSIS — H903 Sensorineural hearing loss, bilateral: Secondary | ICD-10-CM | POA: Diagnosis not present

## 2012-09-22 DIAGNOSIS — J310 Chronic rhinitis: Secondary | ICD-10-CM | POA: Diagnosis not present

## 2012-09-22 DIAGNOSIS — H6120 Impacted cerumen, unspecified ear: Secondary | ICD-10-CM

## 2012-09-27 ENCOUNTER — Encounter: Payer: Self-pay | Admitting: Gastroenterology

## 2012-10-05 DIAGNOSIS — E119 Type 2 diabetes mellitus without complications: Secondary | ICD-10-CM | POA: Diagnosis not present

## 2012-10-05 DIAGNOSIS — E785 Hyperlipidemia, unspecified: Secondary | ICD-10-CM | POA: Diagnosis not present

## 2012-10-05 DIAGNOSIS — R609 Edema, unspecified: Secondary | ICD-10-CM | POA: Diagnosis not present

## 2012-10-05 DIAGNOSIS — R32 Unspecified urinary incontinence: Secondary | ICD-10-CM | POA: Diagnosis not present

## 2012-10-05 DIAGNOSIS — I10 Essential (primary) hypertension: Secondary | ICD-10-CM | POA: Diagnosis not present

## 2012-11-02 ENCOUNTER — Ambulatory Visit: Payer: Medicare Other | Admitting: Cardiovascular Disease

## 2012-11-10 DIAGNOSIS — E1149 Type 2 diabetes mellitus with other diabetic neurological complication: Secondary | ICD-10-CM | POA: Diagnosis not present

## 2012-11-10 DIAGNOSIS — E119 Type 2 diabetes mellitus without complications: Secondary | ICD-10-CM | POA: Diagnosis not present

## 2012-11-14 ENCOUNTER — Other Ambulatory Visit (HOSPITAL_COMMUNITY): Payer: Self-pay | Admitting: Internal Medicine

## 2012-11-14 DIAGNOSIS — Z139 Encounter for screening, unspecified: Secondary | ICD-10-CM

## 2012-11-24 ENCOUNTER — Ambulatory Visit (HOSPITAL_COMMUNITY)
Admission: RE | Admit: 2012-11-24 | Discharge: 2012-11-24 | Disposition: A | Discharge status: Home / Self Care | Payer: Medicare Other | Source: Ambulatory Visit | Attending: Internal Medicine | Admitting: Internal Medicine

## 2012-11-24 DIAGNOSIS — Z139 Encounter for screening, unspecified: Secondary | ICD-10-CM

## 2012-11-24 DIAGNOSIS — Z1231 Encounter for screening mammogram for malignant neoplasm of breast: Secondary | ICD-10-CM | POA: Insufficient documentation

## 2012-11-28 ENCOUNTER — Encounter: Payer: Self-pay | Admitting: Internal Medicine

## 2012-11-28 ENCOUNTER — Other Ambulatory Visit: Payer: Self-pay | Admitting: Internal Medicine

## 2012-11-28 DIAGNOSIS — R928 Other abnormal and inconclusive findings on diagnostic imaging of breast: Secondary | ICD-10-CM

## 2012-11-30 ENCOUNTER — Encounter: Payer: Self-pay | Admitting: Gastroenterology

## 2012-11-30 ENCOUNTER — Ambulatory Visit (INDEPENDENT_AMBULATORY_CARE_PROVIDER_SITE_OTHER): Payer: Medicare Other | Admitting: Gastroenterology

## 2012-11-30 VITALS — BP 130/70 | HR 88 | Temp 98.4°F | Ht 63.0 in | Wt 187.2 lb

## 2012-11-30 DIAGNOSIS — R1310 Dysphagia, unspecified: Secondary | ICD-10-CM

## 2012-11-30 DIAGNOSIS — Z860100 Personal history of colon polyps, unspecified: Secondary | ICD-10-CM

## 2012-11-30 DIAGNOSIS — R1314 Dysphagia, pharyngoesophageal phase: Secondary | ICD-10-CM

## 2012-11-30 NOTE — Addendum Note (Signed)
Addended by: Jennings Books on: 11/30/2012 11:51 AM   Modules accepted: Orders

## 2012-11-30 NOTE — Patient Instructions (Addendum)
We have scheduled you for an xray swallow of your esophagus. We will call with the results.  Please call us as soon as you find out the name of the medication that worked for you in the past.   We will see you back in 6 weeks!

## 2012-11-30 NOTE — Progress Notes (Signed)
Referring Provider: Catalina Pizza, MD Primary Care Physician:  Catalina Pizza, MD Primary GI: Dr. Jena Gauss   Chief Complaint  Patient presents with  . Follow-up  . Gastrophageal Reflux    HPI:   Ms. Stingley is an 79 year old female who returns today in follow-up after EGD/TCS. History of dysphagia, with Schatzki's ring dilated at time of EGD. Erosive esophagitis noted. Colonoscopy with poor prep, tubular adenoma. Needs surveillance April 2015.   States dysphagia not improved. Feels like she is worse. Notes "gurgling" in her throat. Feels like there is "something" in her cervical area. States it "bubbles" when drinking water. Swallowing makes it gurgle, then she regurgitates. 30-40 minutes later, "comes back up". While sleeping, family members can hear gurgling. Constantly burping at night. Protonix once each evening. States another reflux medication used to help, but she can't remember the name.   Past Medical History  Diagnosis Date  . HTN (hypertension)   . Dyslipidemia   . DM type 2 (diabetes mellitus, type 2)     Past Surgical History  Procedure Laterality Date  . Abdominal hysterectomy    . Rectal surgery      prolapsed rectum/hemorrhoids  . Breast biopsy    . Eye surgery      both  . Hip surgery      right x 2  . Appendectomy    . Colonoscopy N/A 09/08/2012    RMR: tubular adenoma, poor prep, needs surveillance April 2015   . Esophagogastroduodenoscopy (egd) with esophageal dilation N/A 09/08/2012    RMR: Schatzki's ring s/p 54- F dilation, erosive esophagitis, hiatal hernia, gastric/bulb erosions with mild pyloric stenosis. Negative path  . Colonoscopy  1990/1992/1996    [facility], multiple hyperplastic polyps    Current Outpatient Prescriptions  Medication Sig Dispense Refill  . bumetanide (BUMEX) 1 MG tablet Take 1 mg by mouth daily.      Jennette Banker Sodium 30-100 MG CAPS Take 1 capsule by mouth as needed.      . citalopram (CELEXA) 20 MG  tablet Take 20 mg by mouth daily.      Marland Kitchen gabapentin (NEURONTIN) 300 MG capsule Take 300 mg by mouth 3 (three) times daily.      Marland Kitchen glyBURIDE (DIABETA) 5 MG tablet Take 2.5 mg by mouth daily with breakfast.      . lovastatin (MEVACOR) 40 MG tablet Take 40 mg by mouth at bedtime.      . meclizine (ANTIVERT) 25 MG tablet Take 25 mg by mouth 4 (four) times daily as needed.      . metFORMIN (GLUCOPHAGE-XR) 500 MG 24 hr tablet Take 500 mg by mouth 2 (two) times daily.      . pantoprazole (PROTONIX) 40 MG tablet Take 40 mg by mouth daily.      . polyethylene glycol powder (GLYCOLAX/MIRALAX) powder Take 17 g by mouth daily.  527 g  11  . traMADol (ULTRAM) 50 MG tablet Take 50 mg by mouth every 6 (six) hours as needed for pain.      . Vitamin D, Ergocalciferol, (DRISDOL) 50000 UNITS CAPS Take 50,000 Units by mouth every 7 (seven) days.       No current facility-administered medications for this visit.    Allergies as of 11/30/2012  . (No Known Allergies)    Family History  Problem Relation Age of Onset  . Hypertension Mother   . Hypertension Son   . Colon cancer Neg Hx     History   Social History  .  Marital Status: Married    Spouse Name: N/A    Number of Children: N/A  . Years of Education: N/A   Social History Main Topics  . Smoking status: Never Smoker   . Smokeless tobacco: None  . Alcohol Use: No  . Drug Use: No  . Sexually Active: None   Other Topics Concern  . None   Social History Narrative  . None    Review of Systems: Negative unless mentioned in HPI  Physical Exam: BP 130/70  Pulse 88  Temp(Src) 98.4 F (36.9 C) (Oral)  Ht 5\' 3"  (1.6 m)  Wt 187 lb 3.2 oz (84.913 kg)  BMI 33.17 kg/m2 General:   Alert and oriented. No distress noted. Pleasant and cooperative.  Head:  Normocephalic and atraumatic. Eyes:  Conjuctiva clear without scleral icterus. Neck:  Supple, without mass or thyromegaly. Heart:  S1, S2 present without murmurs, rubs, or gallops. Regular  rate and rhythm. Abdomen:  +BS, soft, non-tender and non-distended. No rebound or guarding. No HSM. Small umbilical hernia likely.  Msk:  Symmetrical without gross deformities. Normal posture. Extremities:  Left ankle/pedal edema, chronic Neurologic:  Alert and  oriented x4;  grossly normal neurologically. Skin:  Intact without significant lesions or rashes. Cervical Nodes:  No significant cervical adenopathy. Psych:  Alert and cooperative. Normal mood and affect.

## 2012-11-30 NOTE — Progress Notes (Signed)
Cc PCP 

## 2012-11-30 NOTE — Assessment & Plan Note (Signed)
79 year old female with recent dilation of Schatzki's ring, returning with persistent dysphagia, pointing to cervical area. Her symptoms are interesting, in that she notes "gurgling" and regurgitation of food at times. No improvement with Protonix; however, she states another medication has done well for her in the past. She does not remember the name, but she will be calling us with this information.  Concern for underlying motility disorder remains. Will proceed with BPE in near future. Switch PPIs in near future, once she lets [place] know what has previously worked for her; will likely advise BID. Return in 6 weeks with RMR.

## 2012-11-30 NOTE — Assessment & Plan Note (Signed)
Tubular adenoma on colonoscopy 2014. Needs surveillance in 2015 due to poor prep.

## 2012-12-07 ENCOUNTER — Other Ambulatory Visit (HOSPITAL_COMMUNITY): Payer: Self-pay | Admitting: Internal Medicine

## 2012-12-07 ENCOUNTER — Ambulatory Visit (HOSPITAL_COMMUNITY)
Admission: RE | Admit: 2012-12-07 | Discharge: 2012-12-07 | Disposition: A | Discharge status: Home / Self Care | Payer: Medicare Other | Source: Ambulatory Visit | Attending: Gastroenterology | Admitting: Gastroenterology

## 2012-12-07 ENCOUNTER — Other Ambulatory Visit: Payer: Self-pay | Admitting: Gastroenterology

## 2012-12-07 ENCOUNTER — Encounter (HOSPITAL_COMMUNITY): Payer: Self-pay

## 2012-12-07 ENCOUNTER — Ambulatory Visit (HOSPITAL_COMMUNITY)
Admission: RE | Admit: 2012-12-07 | Discharge: 2012-12-07 | Disposition: A | Discharge status: Home / Self Care | Payer: Medicare Other | Source: Ambulatory Visit | Attending: Internal Medicine | Admitting: Internal Medicine

## 2012-12-07 ENCOUNTER — Other Ambulatory Visit (HOSPITAL_COMMUNITY): Payer: Medicare Other

## 2012-12-07 DIAGNOSIS — R928 Other abnormal and inconclusive findings on diagnostic imaging of breast: Secondary | ICD-10-CM | POA: Diagnosis not present

## 2012-12-07 DIAGNOSIS — D249 Benign neoplasm of unspecified breast: Secondary | ICD-10-CM | POA: Diagnosis not present

## 2012-12-07 DIAGNOSIS — R1310 Dysphagia, unspecified: Secondary | ICD-10-CM | POA: Diagnosis not present

## 2012-12-07 DIAGNOSIS — N63 Unspecified lump in unspecified breast: Secondary | ICD-10-CM | POA: Insufficient documentation

## 2012-12-07 MED ORDER — LIDOCAINE HCL (PF) 2 % IJ SOLN
INTRAMUSCULAR | Status: AC
  Filled 2012-12-07: qty 10

## 2012-12-07 MED ORDER — LIDOCAINE HCL (PF) 2 % IJ SOLN
10.0000 mL | Freq: Once | INTRAMUSCULAR | Status: AC
  Administered 2012-12-07: 8 mL via INTRADERMAL
  Filled 2012-12-07: qty 10

## 2012-12-07 MED ORDER — DEXLANSOPRAZOLE 60 MG PO CPDR: 60.0000 mg | DELAYED_RELEASE_CAPSULE | Freq: Every day | ORAL | Status: DC

## 2012-12-07 NOTE — Progress Notes (Signed)
Quick Note:  I did not order this.  Please fax this to Dr. Catalina Pizza asap. He may already have it. ______

## 2012-12-07 NOTE — ED Notes (Addendum)
Pt for left breast biopsy with Dr.Arceo.

## 2012-12-07 NOTE — Progress Notes (Signed)
Dexilant once daily sent to pharmacy.

## 2012-12-07 NOTE — Addendum Note (Signed)
Addended by: Nira Retort on: 12/07/2012 03:34 PM   Modules accepted: Orders

## 2012-12-07 NOTE — Progress Notes (Signed)
Patient states that the medicine that worked well for reflux was Dexilant.  Routing to [employer]

## 2012-12-20 NOTE — Progress Notes (Signed)
Quick Note:  Age-related motility changes on BPE. No mass or stricture noted. However, patient was unable to swallow the tablet. May ultimately need speech pathology appointment. How is Amanda Hale doing with Dexilant? ______

## 2012-12-30 ENCOUNTER — Ambulatory Visit (INDEPENDENT_AMBULATORY_CARE_PROVIDER_SITE_OTHER): Payer: Medicare Other | Admitting: Internal Medicine

## 2012-12-30 ENCOUNTER — Encounter: Payer: Self-pay | Admitting: Internal Medicine

## 2012-12-30 VITALS — BP 142/67 | HR 85 | Temp 97.2°F | Ht 63.0 in | Wt 190.2 lb

## 2012-12-30 DIAGNOSIS — R1310 Dysphagia, unspecified: Secondary | ICD-10-CM

## 2012-12-30 DIAGNOSIS — K219 Gastro-esophageal reflux disease without esophagitis: Secondary | ICD-10-CM | POA: Diagnosis not present

## 2012-12-30 NOTE — Progress Notes (Signed)
Primary Care Physician:  Catalina Pizza, MD Primary Gastroenterologist:  Dr. Jena Gauss  Pre-Procedure History & Physical: HPI:  Amanda Hale is a 79 y.o. female here for  followup of dysphagia. Prior EGD demonstrated a Schatzki's ring dilated with a 54 [place]oney dilator. Persisting dysphagia which has more of an oropharyngeal flavor these days. Barium pill esophagram was attempted. She could not swallow the pill. Had esophageal dysmotility  - no obvious stricture. No Zenker's diverticulum. Reflux symptoms well controlled on Dexilant 60 mg daily.  Past Medical History  Diagnosis Date  . HTN (hypertension)   . Dyslipidemia   . DM type 2 (diabetes mellitus, type 2)   . Schatzki's ring 2014  . Hiatal hernia 2014  . Reflux esophagitis 2014    erosive  . Tubular adenoma     Past Surgical History  Procedure Laterality Date  . Abdominal hysterectomy    . Rectal surgery      prolapsed rectum/hemorrhoids  . Breast biopsy    . Eye surgery      both  . Hip surgery      right x 2  . Appendectomy    . Colonoscopy N/A 09/08/2012    RMR: tubular adenoma, poor prep, needs surveillance April 2015   . Esophagogastroduodenoscopy (egd) with esophageal dilation N/A 09/08/2012    RMR: Schatzki's ring s/p 54- F dilation, erosive esophagitis, hiatal hernia, gastric/bulb erosions with mild pyloric stenosis. Negative path  . Colonoscopy  1990/1992/1996    [facility], multiple hyperplastic polyps    Prior to Admission medications   Medication Sig Start Date End Date Taking? Authorizing Provider  bumetanide (BUMEX) 1 MG tablet Take 1 mg by mouth daily.   Yes Historical Provider, MD  Casanthranol-Docusate Sodium 30-100 MG CAPS Take 1 capsule by mouth as needed.   Yes Historical Provider, MD  citalopram (CELEXA) 20 MG tablet Take 20 mg by mouth daily.   Yes Historical Provider, MD  dexlansoprazole (DEXILANT) 60 MG capsule Take 1 capsule (60 mg total) by mouth daily. 12/07/12  Yes Nira Retort,  NP  gabapentin (NEURONTIN) 300 MG capsule Take 300 mg by mouth 3 (three) times daily.   Yes Historical Provider, MD  glyBURIDE (DIABETA) 5 MG tablet Take 2.5 mg by mouth daily with breakfast.   Yes Historical Provider, MD  losartan-hydrochlorothiazide (HYZAAR) 100-12.5 MG per tablet Take 1 tablet by mouth daily.   Yes Historical Provider, MD  lovastatin (MEVACOR) 40 MG tablet Take 40 mg by mouth at bedtime.   Yes Historical Provider, MD  metFORMIN (GLUCOPHAGE-XR) 500 MG 24 hr tablet Take 500 mg by mouth 2 (two) times daily.   Yes Historical Provider, MD  polyethylene glycol powder (GLYCOLAX/MIRALAX) powder Take 17 g by mouth daily. 08/11/12  Yes Tiffany Kocher, PA-C  traMADol (ULTRAM) 50 MG tablet Take 50 mg by mouth every 6 (six) hours as needed for pain.   Yes Historical Provider, MD  meclizine (ANTIVERT) 25 MG tablet Take 25 mg by mouth 4 (four) times daily as needed.    Historical Provider, MD  pantoprazole (PROTONIX) 40 MG tablet Take 40 mg by mouth daily.    Historical Provider, MD  Vitamin D, Ergocalciferol, (DRISDOL) 50000 UNITS CAPS Take 50,000 Units by mouth every 7 (seven) days.    Historical Provider, MD    Allergies as of 12/30/2012  . (No Known Allergies)    Family History  Problem Relation Age of Onset  . Hypertension Mother   . Hypertension Son   .  Colon cancer Neg Hx     History   Social History  . Marital Status: Married    Spouse Name: N/A    Number of Children: N/A  . Years of Education: N/A   Occupational History  . Not on file.   Social History Main Topics  . Smoking status: Never Smoker   . Smokeless tobacco: Not on file  . Alcohol Use: No  . Drug Use: No  . Sexual Activity: Not on file   Other Topics Concern  . Not on file   Social History Narrative  . No narrative on file    Review of Systems: See HPI, otherwise negative ROS  Physical Exam: BP 142/67  Pulse 85  Temp(Src) 97.2 F (36.2 C) (Oral)  Ht 5\' 3"  (1.6 m)  Wt 190 lb 3.2 oz  (86.274 kg)  BMI 33.7 kg/m2 General:   Elderly, frail lady accompanied by her son. Comfortable appearing. Conversant. Well oriented.  Skin:  Intact without significant lesions or rashes. Eyes:  Sclera clear, no icterus.   Conjunctiva pink. Ears:  Normal auditory acuity. Nose:  No deformity, discharge,  or lesions. Mouth:  No deformity or lesions. Abdomen: Obese. Non-distended, normal bowel sounds.  Soft and nontender without appreciable mass or hepatosplenomegaly.  Pulses:  Normal pulses noted. Extremities:  Without clubbing or edema.  Impression/Plan:  79 year old lady with GERD and dysphagia. Barium esophagram demonstrates dysmotility likely age-related. No obvious stricture. No Zenkers.  I feel we ended up with a good dilation of the Schatzki's ring previously. She does have more an oropharyngeal component to her symptoms. She may benefit from speech/swallowing evaluation.  Likely no definitive treatment for her condition.  GERD symptoms well controlled on Dexilant.   Recommendations:     Proceed with a speech therapy swallowing evaluation  Continue Align, Metamucil and Miralax daily  Would also recommend resuming Nexium for GERD (and nausea)  Colonoscopy 2016  Office visit here in 1 year

## 2012-12-30 NOTE — Patient Instructions (Addendum)
Continue Align, Metamucil and Miralax daily  Would also recommend resuming Nexium for GERD (and nausea)  Colonoscopy 2016  Office visit here in 1 year 

## 2013-01-02 ENCOUNTER — Telehealth: Payer: Self-pay | Admitting: Gastroenterology

## 2013-01-02 ENCOUNTER — Other Ambulatory Visit: Payer: Self-pay | Admitting: Internal Medicine

## 2013-01-02 ENCOUNTER — Other Ambulatory Visit: Payer: Self-pay | Admitting: Gastroenterology

## 2013-01-02 DIAGNOSIS — R1310 Dysphagia, unspecified: Secondary | ICD-10-CM

## 2013-01-02 NOTE — Telephone Encounter (Signed)
Referral has been faxed to speech pathology

## 2013-01-02 NOTE — Telephone Encounter (Signed)
Thank you :)

## 2013-01-06 ENCOUNTER — Ambulatory Visit: Payer: Medicare Other | Admitting: Internal Medicine

## 2013-01-09 DIAGNOSIS — I10 Essential (primary) hypertension: Secondary | ICD-10-CM | POA: Diagnosis not present

## 2013-01-09 DIAGNOSIS — E119 Type 2 diabetes mellitus without complications: Secondary | ICD-10-CM | POA: Diagnosis not present

## 2013-01-09 DIAGNOSIS — E785 Hyperlipidemia, unspecified: Secondary | ICD-10-CM | POA: Diagnosis not present

## 2013-01-10 ENCOUNTER — Ambulatory Visit (HOSPITAL_COMMUNITY): Payer: Medicare Other | Admitting: Speech Pathology

## 2013-01-10 ENCOUNTER — Other Ambulatory Visit (HOSPITAL_COMMUNITY): Payer: Medicare Other

## 2013-01-12 ENCOUNTER — Other Ambulatory Visit: Payer: Self-pay | Admitting: Internal Medicine

## 2013-01-12 ENCOUNTER — Ambulatory Visit (HOSPITAL_COMMUNITY)
Admission: RE | Admit: 2013-01-12 | Discharge: 2013-01-12 | Disposition: A | Discharge status: Home / Self Care | Payer: Medicare Other | Source: Ambulatory Visit | Attending: Internal Medicine | Admitting: Internal Medicine

## 2013-01-12 DIAGNOSIS — E119 Type 2 diabetes mellitus without complications: Secondary | ICD-10-CM | POA: Insufficient documentation

## 2013-01-12 DIAGNOSIS — R1310 Dysphagia, unspecified: Secondary | ICD-10-CM | POA: Insufficient documentation

## 2013-01-12 DIAGNOSIS — IMO0001 Reserved for inherently not codable concepts without codable children: Secondary | ICD-10-CM | POA: Diagnosis not present

## 2013-01-12 DIAGNOSIS — I10 Essential (primary) hypertension: Secondary | ICD-10-CM | POA: Diagnosis not present

## 2013-01-12 NOTE — Procedures (Signed)
Objective Swallowing Evaluation: Modified Barium Swallowing Study   Patient Details  Name: Amanda Hale MRN: [id number] Date of Birth: 02-Oct-1925  Today's Date: 01/12/2013 Time:  - 11:15 AM    Past Medical History:  Past Medical History  Diagnosis Date  . HTN (hypertension)   . Dyslipidemia   . DM type 2 (diabetes mellitus, type 2)   . Schatzki's ring 2014  . Hiatal hernia 2014  . Reflux esophagitis 2014    erosive  . Tubular adenoma    Past Surgical History:  Past Surgical History  Procedure Laterality Date  . Abdominal hysterectomy    . Rectal surgery      prolapsed rectum/hemorrhoids  . Breast biopsy    . Eye surgery      both  . Hip surgery      right x 2  . Appendectomy    . Colonoscopy N/A 09/08/2012    RMR: tubular adenoma, poor prep, needs surveillance April 2015   . Esophagogastroduodenoscopy (egd) with esophageal dilation N/A 09/08/2012    RMR: Schatzki's ring s/p 54- F dilation, erosive esophagitis, hiatal hernia, gastric/bulb erosions with mild pyloric stenosis. Negative path  . Colonoscopy  1990/1992/1996    [facility], multiple hyperplastic polyps   HPI:  Amanda Hale is an 79 year old woman who was referred by Dr. Jena Gauss for MBSS due to pt complaints of globus sensation and regurgitation after meals. She underwent colonoscopy and upper endoscopy with dilation in April 2014 and had barium swallow in July 2014. EGD showed: Schatzki's ring status post dilation, erosive reflux esophagitis, hiatal hernia, and gastric and bulbar erosions with mild pyloric stenosis-status postgastric biopsy. BPE completed in July showed diffuse impairment of esophageal motility with incomplete clearance of barium by primary peristaltic waves, intermittent retrograde peristalsis, and prolonged retention of contrast. Pt was unable to swallow the barium tablet during that exam.   Symptoms/Limitations Symptoms: Amanda Hale was accompanied by her daughter and reports  globus sensation in cervical region. Special Tests: MBSS  Recommendation/Prognosis  Clinical Impression Dysphagia Diagnosis: Within Functional Limits  Clinical impression: Oropharyngeal swallow is essentially [facility] with the exception of pt having a difficult time propelling pills posteriorly (this is baseline per patient), mild decreased velopharyngeal closure. No penetration or aspiration was observed. Pt with trace amount of residuals in vallecular space which clear with repeat swallow. Pt continues to report globus sensation in cervical region, however no objective correlate found, I spoke with the patient and her daughter at length in regards to results of MBSS and possibilities for her symtpoms. Her daughter reports that pt wakes up gasping for breath during the night and the pt has no recollection of this. I wonder whether symptoms are related to reflux (she has been taking Dexilant for about a month once per day per pt). Reflux precautions were reviewed with pt/family. She currently does not elevate HOB, but sleeps on two pillows. Consider changing reflux meds to BID and elevating HOB. Pt had a sleep study over 5 years ago and perhaps she needs another given night time waking per daughter.   Swallow Evaluation Recommendations Diet Recommendations: Regular;Thin liquid Liquid Administration via: Cup;Straw Medication Administration: Whole meds with liquid (or whole with puree) Supervision: Patient able to self feed Postural Changes and/or Swallow Maneuvers: Out of bed for meals;Seated upright 90 degrees;Upright 30-60 min after meal Oral Care Recommendations: Oral care BID Other Recommendations: Clarify dietary restrictions Follow up Recommendations: None   Individuals Consulted Consulted and Agree with Results and  Recommendations: Patient;Family member/caregiver Family Member Consulted: daughter Report Sent to : Referring physician    General:  Date of Onset: 09/08/12 HPI: Amanda Hale is an 79 year old woman who was referred by Dr. Jena Gauss for MBSS due to pt complaints of globus sensation and regurgitation after meals. She underwent colonoscopy and upper endoscopy with dilation in April 2014 and had barium swallow in July 2014. EGD showed: Schatzki's ring status post dilation, erosive reflux esophagitis, hiatal hernia, and gastric and bulbar erosions with mild pyloric stenosis-status postgastric biopsy. BPE completed in July showed diffuse impairment of esophageal motility with incomplete clearance of barium by primary peristaltic waves, intermittent retrograde peristalsis, and prolonged retention of contrast. Pt was unable to swallow the barium tablet during that exam.  Type of Study: Modified Barium Swallowing Study Reason for Referral: Objectively evaluate swallowing function Previous Swallow Assessment: BPE in July 2014- age related impairment of esophageal motility with incomplete clearance with primary peristaltic wave.  Diet Prior to this Study: Regular;Thin liquids Temperature Spikes Noted: No Respiratory Status: Room air History of Recent Intubation: No Behavior/Cognition: Alert;Cooperative;Pleasant mood Oral Cavity - Dentition: Adequate natural dentition Oral Motor / Sensory Function: Within functional limits Self-Feeding Abilities: Able to feed self Patient Positioning: Upright in chair Baseline Vocal Quality: Clear Volitional Cough: Strong Volitional Swallow: Able to elicit Anatomy: Within functional limits Pharyngeal Secretions: Not observed secondary MBS  Reason for Referral:  Objectively evaluate swallowing function   Oral Phase Oral Preparation/Oral Phase Oral Phase: Impaired Oral - Solids Oral - Pill: Reduced posterior propulsion;Piecemeal swallowing;Delayed oral transit;Decreased velopharyngeal closure Oral Phase - Comment Oral Phase - Comment: Pt had difficulty with anterior-posterior transit of barium tablet, however with multiple  sips/swallows pt was able to swallow (once she got the pill near faucial pillars she had no difficulty). Pt appeared to have slight decrease in velopharyngeal closure during the swallow with liquids and puree (trace amount of contrast could be seen heading above uvulae during the swallow). Pharyngeal Phase  Pharyngeal Phase Pharyngeal Phase: Impaired Pharyngeal - Thin Pharyngeal - Thin Cup: Premature spillage to valleculae;Pharyngeal residue - valleculae Pharyngeal - Solids Pharyngeal - Puree: Pharyngeal residue - valleculae Pharyngeal Phase - Comment Pharyngeal Comment: Trace amounts of residuals noted in valleculae with puree and thin liquids which clear with repeat swallow. Cervical Esophageal Phase  Cervical Esophageal Phase Cervical Esophageal Phase: Impaired Cervical Esophageal Phase - Solids Puree: Prominent cricopharyngeal segment Cervical Esophageal Phase - Comment Cervical Esophageal Comment: delayed emptying in distal esophagus which supports findings of Barium Swallow (diffuse impairment of esophageal motility with incomplete clearance of barium by primary peristaltic waves)   G-Codes Functional Assessment Tool Used: MBSS Functional Limitations: Swallowing Swallow Current Status (Z6109): At least 1 percent but less than 20 percent impaired, limited or restricted Swallow Goal Status [phone]): At least 1 percent but less than 20 percent impaired, limited or restricted Swallow Discharge Status [phone]): At least 1 percent but less than 20 percent impaired, limited or restricted   Thank you,  Havery Moros, CCC-SLP [phone]  PORTER,DABNEY 01/12/2013, 2:10 PM

## 2013-01-13 DIAGNOSIS — E119 Type 2 diabetes mellitus without complications: Secondary | ICD-10-CM | POA: Diagnosis not present

## 2013-01-13 DIAGNOSIS — I10 Essential (primary) hypertension: Secondary | ICD-10-CM | POA: Diagnosis not present

## 2013-01-13 DIAGNOSIS — K219 Gastro-esophageal reflux disease without esophagitis: Secondary | ICD-10-CM | POA: Diagnosis not present

## 2013-01-19 DIAGNOSIS — L851 Acquired keratosis [keratoderma] palmaris et plantaris: Secondary | ICD-10-CM | POA: Diagnosis not present

## 2013-01-19 DIAGNOSIS — E1149 Type 2 diabetes mellitus with other diabetic neurological complication: Secondary | ICD-10-CM | POA: Diagnosis not present

## 2013-01-19 DIAGNOSIS — E119 Type 2 diabetes mellitus without complications: Secondary | ICD-10-CM | POA: Diagnosis not present

## 2013-01-19 DIAGNOSIS — L609 Nail disorder, unspecified: Secondary | ICD-10-CM | POA: Diagnosis not present

## 2013-02-09 ENCOUNTER — Telehealth: Payer: Self-pay | Admitting: Gastroenterology

## 2013-02-09 NOTE — Telephone Encounter (Signed)
Tried to call pt- NA 

## 2013-02-09 NOTE — Telephone Encounter (Signed)
I see where patient has seen speech pathology.   Let's call/mail the GERD precautions to her. Recommend elevating HOB. See how she is doing. Has an appt with Amanda Hale in October.

## 2013-02-10 NOTE — Telephone Encounter (Signed)
gerd info in mail to pt

## 2013-02-14 NOTE — Telephone Encounter (Signed)
She needs to elevate the actual head of the bed. Don't eat for at least 3-4 hours before bedtime. Is she doing this?  Take Dexilant in the morning and Zantac at night as she is doing for now.  Has appt with Verlon Au upcoming. May need tertiary referral if severe symptoms continue.

## 2013-02-14 NOTE — Telephone Encounter (Signed)
Tried to call pt- LMOM 

## 2013-02-14 NOTE — Telephone Encounter (Signed)
Spoke with pts son- Deniece Portela- she is following recommendations but ever since the egd/ed, she has had a lot of coughing in the late evenings and at night. They took her to see Dr. Margo Aye and he doesn't feel like it is a virus, he thinks it may be her reflux. He told her to take her dexilant in the morning and gave her rx for zantac to take before bedtime. She said this isnt helping and she is coughing and gaging all night with reflux. No pain or burning in chest, no fever. She is swallowing ok but after she eats it feels like it is trying to come back up sometimes.  They want to know if there is anything else they can do.

## 2013-02-16 NOTE — Telephone Encounter (Signed)
Spoke with pt- she continues to have the cough, usually just at night. She tried elevating her bed and she said it made it worse so now she sleeps on 2 pillows instead. She doesn't eat in the evenings. She will be at her appt on 02/21/13 with LSL.

## 2013-02-21 ENCOUNTER — Encounter: Payer: Self-pay | Admitting: Gastroenterology

## 2013-02-21 ENCOUNTER — Ambulatory Visit (INDEPENDENT_AMBULATORY_CARE_PROVIDER_SITE_OTHER): Payer: Medicare Other | Admitting: Gastroenterology

## 2013-02-21 ENCOUNTER — Ambulatory Visit (HOSPITAL_COMMUNITY)
Admission: RE | Admit: 2013-02-21 | Discharge: 2013-02-21 | Disposition: A | Discharge status: Home / Self Care | Payer: Medicare Other | Source: Ambulatory Visit | Attending: Gastroenterology | Admitting: Gastroenterology

## 2013-02-21 VITALS — BP 150/84 | HR 91 | Temp 98.3°F | Ht 60.0 in | Wt 193.2 lb

## 2013-02-21 DIAGNOSIS — R05 Cough: Secondary | ICD-10-CM

## 2013-02-21 DIAGNOSIS — K224 Dyskinesia of esophagus: Secondary | ICD-10-CM | POA: Diagnosis not present

## 2013-02-21 DIAGNOSIS — R059 Cough, unspecified: Secondary | ICD-10-CM | POA: Diagnosis not present

## 2013-02-21 DIAGNOSIS — I771 Stricture of artery: Secondary | ICD-10-CM | POA: Diagnosis not present

## 2013-02-21 DIAGNOSIS — I10 Essential (primary) hypertension: Secondary | ICD-10-CM | POA: Diagnosis not present

## 2013-02-21 DIAGNOSIS — E119 Type 2 diabetes mellitus without complications: Secondary | ICD-10-CM | POA: Insufficient documentation

## 2013-02-21 NOTE — Progress Notes (Signed)
CC'd to PCP 

## 2013-02-21 NOTE — Patient Instructions (Addendum)
1. Increase Dexilant to twice daily for the next 30 days. Samples provided. You will take the second dose about a half an hour before your evening meal. 2. Stop ranitidine. 3. Chest x-ray as planned. 4. Please purchase a pillow wedge.  5. Wait three hours before laying down after your evening meal.

## 2013-02-21 NOTE — Progress Notes (Signed)
Primary Care Physician: Catalina Pizza, MD  Primary Gastroenterologist:  Roetta Sessions, MD   Chief Complaint  Patient presents with  . Follow-up  . Dysphagia    HPI: Amanda Hale is a 79 y.o. female here for f/u dysphagia, nocturnal coughing. Extensive evaluation including EGD/ED in 08/2012 with ERE, s/p dilation of Schatzki's ring, gastric/bulbar erosions with mild pyloric stenosis (biopsies were negative), barium esophagram showed age-related impairment of esophageal motility (patient could not swallow barium tablet), MBSS unremarkable.   No coughing while awake. If falls asleep reclined she coughs some. At nighttime, coughs a lot. Tried to sleep with head of bed elevated but kept sliding down. Right now sleeping on two pillows. Has to sleep in back due to hip surgeries. Daughter states patient gurgles and wet airway during sleep. Coughs up some white mucous during the night. No postnasal drip. No obvious shortness of breath although she states her mother breathes fast when she is asleep. No heartburn. Food goes down okay. Eats at 7pm and lays down at 9pm. No weight loss. Bowel movements okay.  Current Outpatient Prescriptions  Medication Sig Dispense Refill  . bumetanide (BUMEX) 1 MG tablet Take 1 mg by mouth daily.      Jennette Banker Sodium 30-100 MG CAPS Take 1 capsule by mouth as needed.      . citalopram (CELEXA) 20 MG tablet Take 20 mg by mouth daily.      Marland Kitchen dexlansoprazole (DEXILANT) 60 MG capsule Take 1 capsule (60 mg total) by mouth daily.  30 capsule  3  . gabapentin (NEURONTIN) 300 MG capsule Take 300 mg by mouth 3 (three) times daily.      Marland Kitchen glyBURIDE (DIABETA) 5 MG tablet Take 2.5 mg by mouth daily with breakfast.      . losartan-hydrochlorothiazide (HYZAAR) 100-12.5 MG per tablet Take 1 tablet by mouth daily.      Marland Kitchen lovastatin (MEVACOR) 40 MG tablet Take 40 mg by mouth at bedtime.      . meclizine (ANTIVERT) 25 MG tablet Take 25 mg by mouth 4 (four) times daily as  needed.      . metFORMIN (GLUCOPHAGE-XR) 500 MG 24 hr tablet Take 500 mg by mouth 2 (two) times daily.      . polyethylene glycol powder (GLYCOLAX/MIRALAX) powder Take 17 g by mouth daily.  527 g  11  . ranitidine (ZANTAC) 150 MG tablet Take 150 mg by mouth at bedtime.      . traMADol (ULTRAM) 50 MG tablet Take 50 mg by mouth every 6 (six) hours as needed for pain.      . Vitamin D, Ergocalciferol, (DRISDOL) 50000 UNITS CAPS Take 50,000 Units by mouth every 7 (seven) days.       No current facility-administered medications for this visit.    Allergies as of 02/21/2013  . (No Known Allergies)    ROS:  General: Negative for anorexia, weight loss, fever, chills, fatigue, weakness. ENT: Negative for hoarseness, difficulty swallowing , nasal congestion. CV: Negative for chest pain, angina, palpitations, dyspnea on exertion, peripheral edema.  Respiratory: Negative for dyspnea at rest, dyspnea on exertion, wheezing. See history of present illness. GI: See history of present illness. GU:  Negative for dysuria, hematuria, urinary incontinence, urinary frequency, nocturnal urination.  Endo: Negative for unusual weight change.    Physical Examination:   BP 150/84  Pulse 91  Temp(Src) 98.3 F (36.8 C) (Oral)  Ht 5' (1.524 m)  Wt 193 lb 3.2 oz (87.635 kg)  BMI  37.73 kg/m2  General: Well-nourished, well-developed in no acute distress.  Eyes: No icterus. Mouth: Oropharyngeal mucosa moist and pink , no lesions erythema or exudate. Lungs: Clear to auscultation bilaterally.  Heart: Regular rate and rhythm, no murmurs rubs or gallops.  Abdomen: Bowel sounds are normal, nontender, nondistended, no hepatosplenomegaly or masses, no abdominal bruits or hernia , no rebound or guarding.   Extremities: No lower extremity edema. No clubbing or deformities. Neuro: Alert and oriented x 4   Skin: Warm and dry, no jaundice.   Psych: Alert and cooperative, normal mood and affect.

## 2013-02-21 NOTE — Assessment & Plan Note (Addendum)
Really no difficulties with meals. She has numerous nocturnal complaints with regards to globus, nighttime coughing when she lays down. Wakes up and coughs up large amounts of sputum. Daughter witnesses this every night the patient doesn't recall it. According to her daughter, she sounds like she is gurgling prior to the cough. It is unclear whether her symptoms are related to reflux which is worsened by esophageal motility disorder. She denies any bitter taste when she coughs the sputum up. Cannot rule out orthopnea or lung issues. We will aggressively treat her reflux symptoms are for the next 30 days. Other recommendations as below.  Plan for CXR PA/laternal. Sleep on pillow wedge. Wait 3 hours after evening meal before laying down. Stop ranitidine. Increase Dexilant to BID for 30 days. Samples provided. She will call if continued symptoms. She did not want to schedule f/u OV.

## 2013-02-22 NOTE — Progress Notes (Signed)
Quick Note:  Please let patient and/or daughter know the CXR was normal. Plan as outlined in OV note.  If no improvement in two weeks, call the office. ______

## 2013-03-17 DIAGNOSIS — R5381 Other malaise: Secondary | ICD-10-CM | POA: Diagnosis not present

## 2013-03-17 DIAGNOSIS — M25569 Pain in unspecified knee: Secondary | ICD-10-CM | POA: Diagnosis not present

## 2013-03-28 DIAGNOSIS — E119 Type 2 diabetes mellitus without complications: Secondary | ICD-10-CM | POA: Diagnosis not present

## 2013-03-28 DIAGNOSIS — E1149 Type 2 diabetes mellitus with other diabetic neurological complication: Secondary | ICD-10-CM | POA: Diagnosis not present

## 2013-06-29 ENCOUNTER — Other Ambulatory Visit (HOSPITAL_COMMUNITY): Payer: Self-pay | Admitting: Internal Medicine

## 2013-06-29 DIAGNOSIS — Z09 Encounter for follow-up examination after completed treatment for conditions other than malignant neoplasm: Secondary | ICD-10-CM

## 2013-07-05 ENCOUNTER — Ambulatory Visit (HOSPITAL_COMMUNITY): Payer: Medicare Other

## 2013-07-12 ENCOUNTER — Other Ambulatory Visit (HOSPITAL_COMMUNITY): Payer: Medicare Other

## 2013-07-12 ENCOUNTER — Ambulatory Visit (HOSPITAL_COMMUNITY)
Admission: RE | Admit: 2013-07-12 | Discharge: 2013-07-12 | Disposition: A | Discharge status: Home / Self Care | Payer: Medicare Other | Source: Ambulatory Visit | Attending: Internal Medicine | Admitting: Internal Medicine

## 2013-07-12 DIAGNOSIS — Z09 Encounter for follow-up examination after completed treatment for conditions other than malignant neoplasm: Secondary | ICD-10-CM | POA: Insufficient documentation

## 2013-09-21 ENCOUNTER — Ambulatory Visit (INDEPENDENT_AMBULATORY_CARE_PROVIDER_SITE_OTHER): Payer: Medicare Other | Admitting: Otolaryngology

## 2013-09-22 DIAGNOSIS — B379 Candidiasis, unspecified: Secondary | ICD-10-CM | POA: Diagnosis not present

## 2013-09-22 DIAGNOSIS — R5381 Other malaise: Secondary | ICD-10-CM | POA: Diagnosis not present

## 2013-10-05 DIAGNOSIS — J069 Acute upper respiratory infection, unspecified: Secondary | ICD-10-CM | POA: Diagnosis not present

## 2013-10-12 DIAGNOSIS — J069 Acute upper respiratory infection, unspecified: Secondary | ICD-10-CM | POA: Diagnosis not present

## 2013-10-12 DIAGNOSIS — G3184 Mild cognitive impairment, so stated: Secondary | ICD-10-CM | POA: Diagnosis not present

## 2013-10-19 ENCOUNTER — Other Ambulatory Visit (HOSPITAL_COMMUNITY): Payer: Self-pay | Admitting: Internal Medicine

## 2013-10-19 ENCOUNTER — Ambulatory Visit (HOSPITAL_COMMUNITY)
Admission: RE | Admit: 2013-10-19 | Discharge: 2013-10-19 | Disposition: A | Discharge status: Home / Self Care | Payer: Medicare Other | Source: Ambulatory Visit | Attending: Internal Medicine | Admitting: Internal Medicine

## 2013-10-19 DIAGNOSIS — R0602 Shortness of breath: Secondary | ICD-10-CM

## 2013-10-19 DIAGNOSIS — R05 Cough: Secondary | ICD-10-CM | POA: Insufficient documentation

## 2013-10-19 DIAGNOSIS — R059 Cough, unspecified: Secondary | ICD-10-CM | POA: Insufficient documentation

## 2013-11-21 ENCOUNTER — Other Ambulatory Visit: Payer: Self-pay | Admitting: Internal Medicine

## 2013-11-21 DIAGNOSIS — N63 Unspecified lump in unspecified breast: Secondary | ICD-10-CM

## 2013-11-28 ENCOUNTER — Ambulatory Visit
Admission: RE | Admit: 2013-11-28 | Discharge: 2013-11-28 | Disposition: A | Discharge status: Home / Self Care | Payer: Medicare Other | Source: Ambulatory Visit | Attending: Internal Medicine | Admitting: Internal Medicine

## 2013-11-28 DIAGNOSIS — N63 Unspecified lump in unspecified breast: Secondary | ICD-10-CM

## 2014-02-06 DIAGNOSIS — E1149 Type 2 diabetes mellitus with other diabetic neurological complication: Secondary | ICD-10-CM | POA: Diagnosis not present

## 2014-02-06 DIAGNOSIS — E119 Type 2 diabetes mellitus without complications: Secondary | ICD-10-CM | POA: Diagnosis not present

## 2014-03-13 DIAGNOSIS — E119 Type 2 diabetes mellitus without complications: Secondary | ICD-10-CM | POA: Diagnosis not present

## 2014-03-13 DIAGNOSIS — E559 Vitamin D deficiency, unspecified: Secondary | ICD-10-CM | POA: Diagnosis not present

## 2014-03-13 DIAGNOSIS — I10 Essential (primary) hypertension: Secondary | ICD-10-CM | POA: Diagnosis not present

## 2014-03-15 DIAGNOSIS — I10 Essential (primary) hypertension: Secondary | ICD-10-CM | POA: Diagnosis not present

## 2014-03-15 DIAGNOSIS — N182 Chronic kidney disease, stage 2 (mild): Secondary | ICD-10-CM | POA: Diagnosis not present

## 2014-03-15 DIAGNOSIS — E1122 Type 2 diabetes mellitus with diabetic chronic kidney disease: Secondary | ICD-10-CM | POA: Diagnosis not present

## 2014-03-15 DIAGNOSIS — R4181 Age-related cognitive decline: Secondary | ICD-10-CM | POA: Diagnosis not present

## 2014-04-24 DIAGNOSIS — L110 Acquired keratosis follicularis: Secondary | ICD-10-CM | POA: Diagnosis not present

## 2014-04-24 DIAGNOSIS — E1151 Type 2 diabetes mellitus with diabetic peripheral angiopathy without gangrene: Secondary | ICD-10-CM | POA: Diagnosis not present

## 2014-04-24 DIAGNOSIS — L609 Nail disorder, unspecified: Secondary | ICD-10-CM | POA: Diagnosis not present

## 2014-07-10 DIAGNOSIS — E1122 Type 2 diabetes mellitus with diabetic chronic kidney disease: Secondary | ICD-10-CM | POA: Diagnosis not present

## 2014-07-10 DIAGNOSIS — I10 Essential (primary) hypertension: Secondary | ICD-10-CM | POA: Diagnosis not present

## 2014-07-11 DIAGNOSIS — E1151 Type 2 diabetes mellitus with diabetic peripheral angiopathy without gangrene: Secondary | ICD-10-CM | POA: Diagnosis not present

## 2014-07-11 DIAGNOSIS — L110 Acquired keratosis follicularis: Secondary | ICD-10-CM | POA: Diagnosis not present

## 2014-07-11 DIAGNOSIS — L609 Nail disorder, unspecified: Secondary | ICD-10-CM | POA: Diagnosis not present

## 2014-07-17 DIAGNOSIS — R4181 Age-related cognitive decline: Secondary | ICD-10-CM | POA: Diagnosis not present

## 2014-07-17 DIAGNOSIS — E1122 Type 2 diabetes mellitus with diabetic chronic kidney disease: Secondary | ICD-10-CM | POA: Diagnosis not present

## 2014-07-17 DIAGNOSIS — I10 Essential (primary) hypertension: Secondary | ICD-10-CM | POA: Diagnosis not present

## 2014-07-17 DIAGNOSIS — E785 Hyperlipidemia, unspecified: Secondary | ICD-10-CM | POA: Diagnosis not present

## 2014-07-23 IMAGING — US US BREAST LTD UNI LEFT INC AXILLA
1 series · 4 of 4 positions shown · non-contrast
Comparison: 12/07/2012

CLINICAL DATA: Followup probably benign left breast mass.

EXAM:
ULTRASOUND OF THE LEFT BREAST

[Series 1: us breast ltd uni left inc axilla · 0.04mm/px · 4 of 4 slices shown]
[im 1/4]
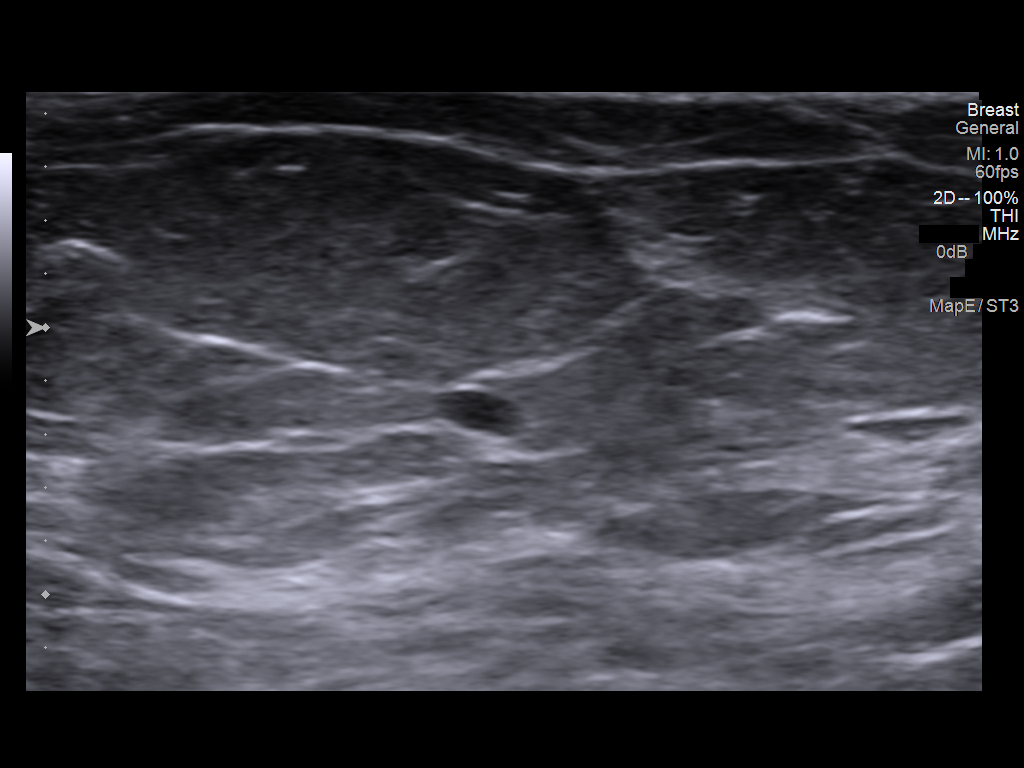
[im 2/4]
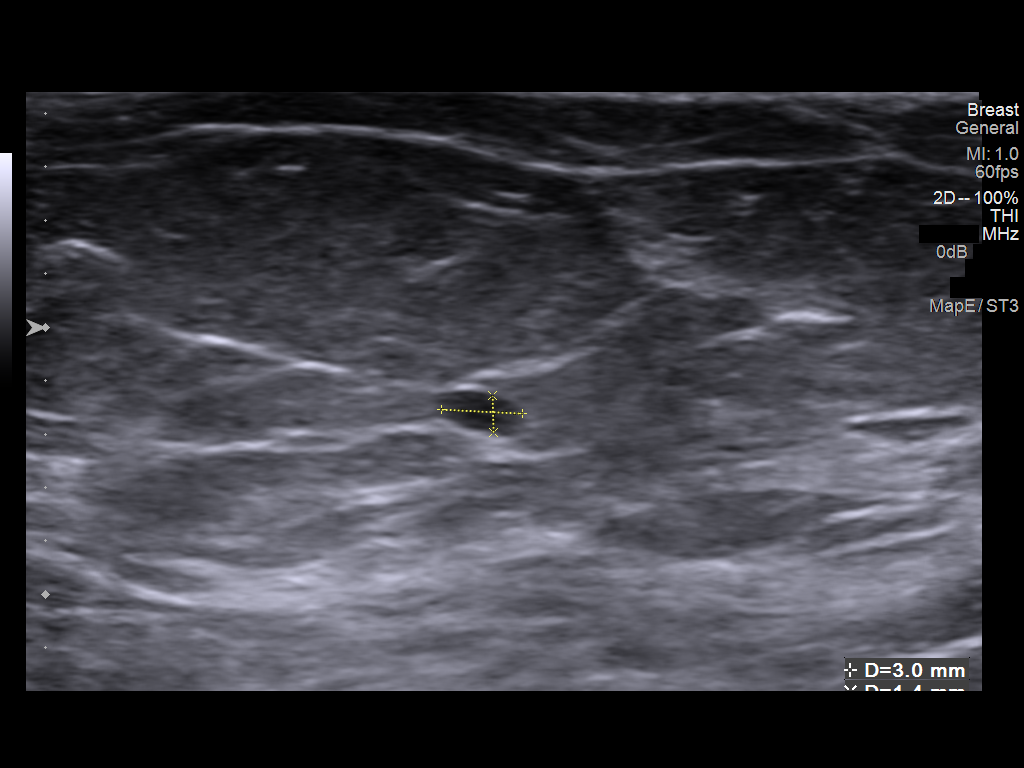
[im 3/4]
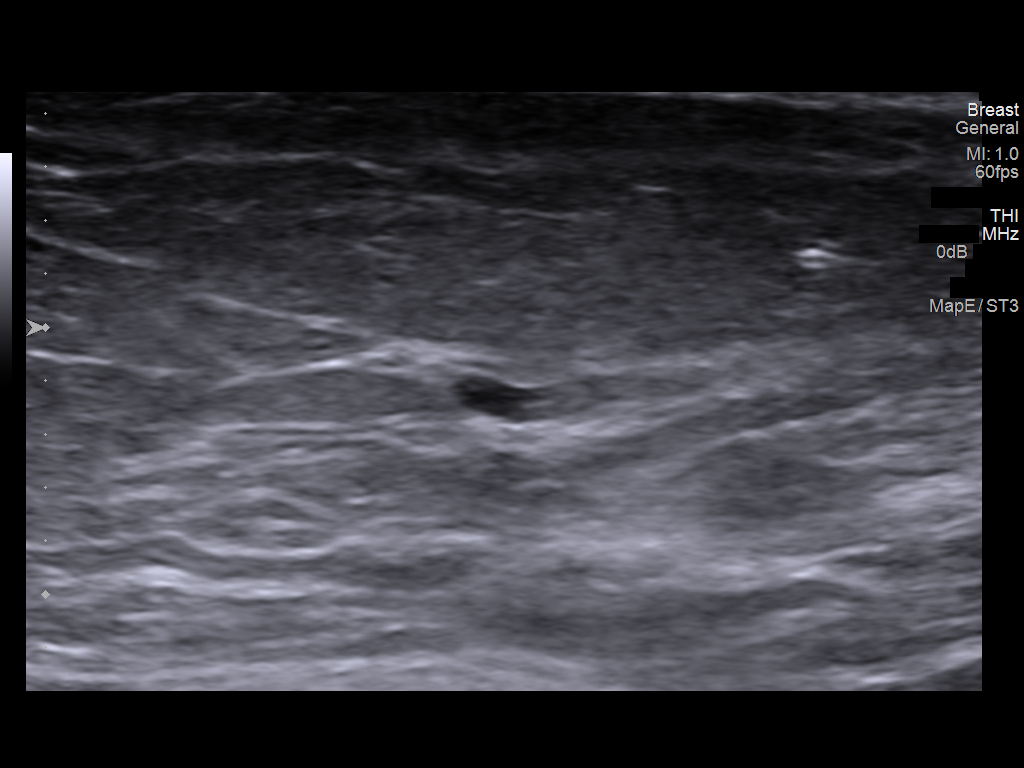
[im 4/4]
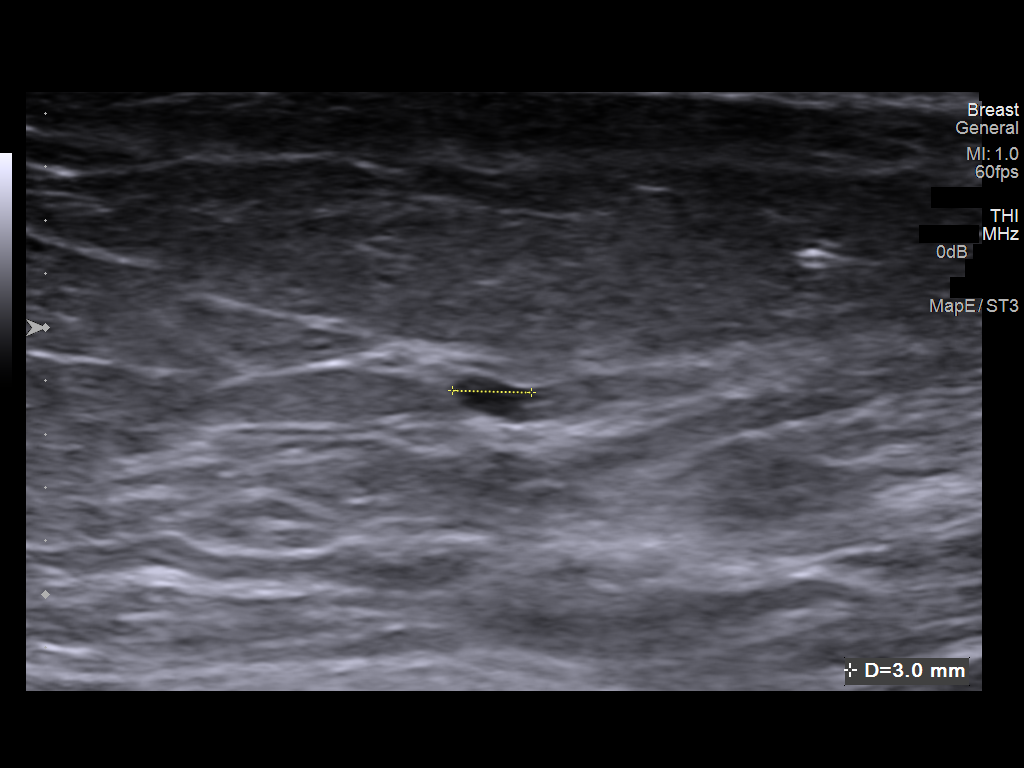

[4 of 4 positions shown; findings below may reference images not displayed]

FINDINGS: On physical exam,no palpable abnormality is identified at 10
o'clock, 4 cm from the nipple.

Targeted ultrasound demonstrates no change to slight decrease in
size of the hypoechoic oval mass at 10 o'clock, 4 cm from the
nipple. It measures 3 x 1 x 3 mm compared with 4 x 2 x 5 mm
previously.
IMPRESSION: No change to slight interval decrease in size of the probably benign
left breast mass.

RECOMMENDATION:
Bilateral diagnostic mammogram and left breast ultrasound in November 2012.

I have discussed the findings and recommendations with the patient.
Results were also provided in writing at the conclusion of the
visit. If applicable, a reminder letter will be sent to the patient
regarding the next appointment.

BI-RADS CATEGORY  3: Probably benign finding(s) - short interval
follow-up suggested.

## 2014-10-03 DIAGNOSIS — E1151 Type 2 diabetes mellitus with diabetic peripheral angiopathy without gangrene: Secondary | ICD-10-CM | POA: Diagnosis not present

## 2014-10-03 DIAGNOSIS — L110 Acquired keratosis follicularis: Secondary | ICD-10-CM | POA: Diagnosis not present

## 2014-10-03 DIAGNOSIS — L609 Nail disorder, unspecified: Secondary | ICD-10-CM | POA: Diagnosis not present

## 2014-10-19 DIAGNOSIS — Z96641 Presence of right artificial hip joint: Secondary | ICD-10-CM | POA: Diagnosis not present

## 2014-10-19 DIAGNOSIS — Z471 Aftercare following joint replacement surgery: Secondary | ICD-10-CM | POA: Diagnosis not present

## 2014-10-19 DIAGNOSIS — M7061 Trochanteric bursitis, right hip: Secondary | ICD-10-CM | POA: Diagnosis not present

## 2014-11-27 DIAGNOSIS — E782 Mixed hyperlipidemia: Secondary | ICD-10-CM | POA: Diagnosis not present

## 2014-11-27 DIAGNOSIS — I10 Essential (primary) hypertension: Secondary | ICD-10-CM | POA: Diagnosis not present

## 2014-11-27 DIAGNOSIS — E1122 Type 2 diabetes mellitus with diabetic chronic kidney disease: Secondary | ICD-10-CM | POA: Diagnosis not present

## 2014-11-29 DIAGNOSIS — I10 Essential (primary) hypertension: Secondary | ICD-10-CM | POA: Diagnosis not present

## 2014-11-29 DIAGNOSIS — E1122 Type 2 diabetes mellitus with diabetic chronic kidney disease: Secondary | ICD-10-CM | POA: Diagnosis not present

## 2014-11-29 DIAGNOSIS — H6120 Impacted cerumen, unspecified ear: Secondary | ICD-10-CM | POA: Diagnosis not present

## 2014-11-29 DIAGNOSIS — N182 Chronic kidney disease, stage 2 (mild): Secondary | ICD-10-CM | POA: Diagnosis not present

## 2014-11-29 DIAGNOSIS — E785 Hyperlipidemia, unspecified: Secondary | ICD-10-CM | POA: Diagnosis not present

## 2014-12-12 DIAGNOSIS — L609 Nail disorder, unspecified: Secondary | ICD-10-CM | POA: Diagnosis not present

## 2014-12-12 DIAGNOSIS — E1151 Type 2 diabetes mellitus with diabetic peripheral angiopathy without gangrene: Secondary | ICD-10-CM | POA: Diagnosis not present

## 2014-12-12 DIAGNOSIS — L110 Acquired keratosis follicularis: Secondary | ICD-10-CM | POA: Diagnosis not present

## 2014-12-18 DIAGNOSIS — Z961 Presence of intraocular lens: Secondary | ICD-10-CM | POA: Diagnosis not present

## 2014-12-18 DIAGNOSIS — E119 Type 2 diabetes mellitus without complications: Secondary | ICD-10-CM | POA: Diagnosis not present

## 2014-12-18 DIAGNOSIS — H3531 Nonexudative age-related macular degeneration: Secondary | ICD-10-CM | POA: Diagnosis not present

## 2014-12-26 ENCOUNTER — Other Ambulatory Visit (HOSPITAL_COMMUNITY): Payer: Self-pay | Admitting: Internal Medicine

## 2014-12-26 DIAGNOSIS — N6320 Unspecified lump in the left breast, unspecified quadrant: Secondary | ICD-10-CM

## 2014-12-26 DIAGNOSIS — M7061 Trochanteric bursitis, right hip: Secondary | ICD-10-CM | POA: Diagnosis not present

## 2014-12-26 DIAGNOSIS — M1712 Unilateral primary osteoarthritis, left knee: Secondary | ICD-10-CM | POA: Diagnosis not present

## 2014-12-26 DIAGNOSIS — M1711 Unilateral primary osteoarthritis, right knee: Secondary | ICD-10-CM | POA: Diagnosis not present

## 2015-01-08 ENCOUNTER — Ambulatory Visit (HOSPITAL_COMMUNITY)
Admission: RE | Admit: 2015-01-08 | Discharge: 2015-01-08 | Disposition: A | Discharge status: Home / Self Care | Payer: Medicare Other | Source: Ambulatory Visit | Attending: Internal Medicine | Admitting: Internal Medicine

## 2015-01-08 ENCOUNTER — Other Ambulatory Visit (HOSPITAL_COMMUNITY): Payer: Self-pay | Admitting: Internal Medicine

## 2015-01-08 DIAGNOSIS — N63 Unspecified lump in breast: Secondary | ICD-10-CM | POA: Insufficient documentation

## 2015-01-08 DIAGNOSIS — N6320 Unspecified lump in the left breast, unspecified quadrant: Secondary | ICD-10-CM

## 2015-01-08 DIAGNOSIS — R928 Other abnormal and inconclusive findings on diagnostic imaging of breast: Secondary | ICD-10-CM | POA: Diagnosis not present

## 2015-01-28 DIAGNOSIS — M25569 Pain in unspecified knee: Secondary | ICD-10-CM | POA: Diagnosis not present

## 2015-01-28 DIAGNOSIS — M542 Cervicalgia: Secondary | ICD-10-CM | POA: Diagnosis not present

## 2015-01-30 ENCOUNTER — Ambulatory Visit (HOSPITAL_COMMUNITY)
Admission: RE | Admit: 2015-01-30 | Discharge: 2015-01-30 | Disposition: A | Discharge status: Home / Self Care | Payer: Medicare Other | Source: Ambulatory Visit | Attending: Internal Medicine | Admitting: Internal Medicine

## 2015-01-30 ENCOUNTER — Other Ambulatory Visit (HOSPITAL_COMMUNITY): Payer: Self-pay | Admitting: Internal Medicine

## 2015-01-30 DIAGNOSIS — M542 Cervicalgia: Secondary | ICD-10-CM

## 2015-01-30 DIAGNOSIS — M5032 Other cervical disc degeneration, mid-cervical region: Secondary | ICD-10-CM | POA: Insufficient documentation

## 2015-01-30 DIAGNOSIS — R42 Dizziness and giddiness: Secondary | ICD-10-CM | POA: Diagnosis not present

## 2015-01-30 DIAGNOSIS — R51 Headache: Secondary | ICD-10-CM | POA: Diagnosis not present

## 2015-02-20 DIAGNOSIS — L110 Acquired keratosis follicularis: Secondary | ICD-10-CM | POA: Diagnosis not present

## 2015-02-20 DIAGNOSIS — E1151 Type 2 diabetes mellitus with diabetic peripheral angiopathy without gangrene: Secondary | ICD-10-CM | POA: Diagnosis not present

## 2015-02-20 DIAGNOSIS — L609 Nail disorder, unspecified: Secondary | ICD-10-CM | POA: Diagnosis not present

## 2015-03-05 DIAGNOSIS — E1122 Type 2 diabetes mellitus with diabetic chronic kidney disease: Secondary | ICD-10-CM | POA: Diagnosis not present

## 2015-03-05 DIAGNOSIS — E785 Hyperlipidemia, unspecified: Secondary | ICD-10-CM | POA: Diagnosis not present

## 2015-03-07 DIAGNOSIS — N182 Chronic kidney disease, stage 2 (mild): Secondary | ICD-10-CM | POA: Diagnosis not present

## 2015-03-07 DIAGNOSIS — I10 Essential (primary) hypertension: Secondary | ICD-10-CM | POA: Diagnosis not present

## 2015-03-07 DIAGNOSIS — E785 Hyperlipidemia, unspecified: Secondary | ICD-10-CM | POA: Diagnosis not present

## 2015-03-07 DIAGNOSIS — E1122 Type 2 diabetes mellitus with diabetic chronic kidney disease: Secondary | ICD-10-CM | POA: Diagnosis not present

## 2015-06-04 DIAGNOSIS — L609 Nail disorder, unspecified: Secondary | ICD-10-CM | POA: Diagnosis not present

## 2015-06-04 DIAGNOSIS — E1151 Type 2 diabetes mellitus with diabetic peripheral angiopathy without gangrene: Secondary | ICD-10-CM | POA: Diagnosis not present

## 2015-06-04 DIAGNOSIS — L110 Acquired keratosis follicularis: Secondary | ICD-10-CM | POA: Diagnosis not present

## 2015-07-04 DIAGNOSIS — I10 Essential (primary) hypertension: Secondary | ICD-10-CM | POA: Diagnosis not present

## 2015-07-04 DIAGNOSIS — E782 Mixed hyperlipidemia: Secondary | ICD-10-CM | POA: Diagnosis not present

## 2015-07-04 DIAGNOSIS — E1122 Type 2 diabetes mellitus with diabetic chronic kidney disease: Secondary | ICD-10-CM | POA: Diagnosis not present

## 2015-07-11 DIAGNOSIS — E782 Mixed hyperlipidemia: Secondary | ICD-10-CM | POA: Diagnosis not present

## 2015-07-11 DIAGNOSIS — N182 Chronic kidney disease, stage 2 (mild): Secondary | ICD-10-CM | POA: Diagnosis not present

## 2015-07-11 DIAGNOSIS — G894 Chronic pain syndrome: Secondary | ICD-10-CM | POA: Diagnosis not present

## 2015-07-11 DIAGNOSIS — F0390 Unspecified dementia without behavioral disturbance: Secondary | ICD-10-CM | POA: Diagnosis not present

## 2015-07-11 DIAGNOSIS — E1122 Type 2 diabetes mellitus with diabetic chronic kidney disease: Secondary | ICD-10-CM | POA: Diagnosis not present

## 2015-07-11 DIAGNOSIS — I10 Essential (primary) hypertension: Secondary | ICD-10-CM | POA: Diagnosis not present

## 2015-07-11 DIAGNOSIS — F411 Generalized anxiety disorder: Secondary | ICD-10-CM | POA: Diagnosis not present

## 2015-07-11 DIAGNOSIS — M25569 Pain in unspecified knee: Secondary | ICD-10-CM | POA: Diagnosis not present

## 2015-07-29 DIAGNOSIS — E1122 Type 2 diabetes mellitus with diabetic chronic kidney disease: Secondary | ICD-10-CM | POA: Diagnosis not present

## 2015-07-29 DIAGNOSIS — G894 Chronic pain syndrome: Secondary | ICD-10-CM | POA: Diagnosis not present

## 2015-07-29 DIAGNOSIS — F0390 Unspecified dementia without behavioral disturbance: Secondary | ICD-10-CM | POA: Diagnosis not present

## 2015-07-29 DIAGNOSIS — I10 Essential (primary) hypertension: Secondary | ICD-10-CM | POA: Diagnosis not present

## 2015-07-29 DIAGNOSIS — M25569 Pain in unspecified knee: Secondary | ICD-10-CM | POA: Diagnosis not present

## 2015-07-29 DIAGNOSIS — F411 Generalized anxiety disorder: Secondary | ICD-10-CM | POA: Diagnosis not present

## 2015-07-29 DIAGNOSIS — N182 Chronic kidney disease, stage 2 (mild): Secondary | ICD-10-CM | POA: Diagnosis not present

## 2015-08-07 ENCOUNTER — Encounter: Payer: Self-pay | Admitting: Cardiovascular Disease

## 2015-08-07 ENCOUNTER — Ambulatory Visit (HOSPITAL_COMMUNITY)
Admission: RE | Admit: 2015-08-07 | Discharge: 2015-08-07 | Disposition: A | Discharge status: Home / Self Care | Payer: Medicare Other | Source: Ambulatory Visit | Attending: Cardiovascular Disease | Admitting: Cardiovascular Disease

## 2015-08-07 ENCOUNTER — Ambulatory Visit (INDEPENDENT_AMBULATORY_CARE_PROVIDER_SITE_OTHER): Payer: Medicare Other | Admitting: Cardiovascular Disease

## 2015-08-07 ENCOUNTER — Other Ambulatory Visit (HOSPITAL_COMMUNITY)
Admission: RE | Admit: 2015-08-07 | Discharge: 2015-08-07 | Disposition: A | Discharge status: Home / Self Care | Payer: Medicare Other | Source: Ambulatory Visit | Attending: Cardiovascular Disease | Admitting: Cardiovascular Disease

## 2015-08-07 ENCOUNTER — Telehealth: Payer: Self-pay | Admitting: *Deleted

## 2015-08-07 ENCOUNTER — Other Ambulatory Visit: Payer: Self-pay | Admitting: *Deleted

## 2015-08-07 VITALS — BP 132/70 | HR 87 | Ht 59.0 in | Wt 144.0 lb

## 2015-08-07 DIAGNOSIS — J90 Pleural effusion, not elsewhere classified: Secondary | ICD-10-CM | POA: Insufficient documentation

## 2015-08-07 DIAGNOSIS — R091 Pleurisy: Secondary | ICD-10-CM | POA: Diagnosis not present

## 2015-08-07 DIAGNOSIS — R0600 Dyspnea, unspecified: Secondary | ICD-10-CM | POA: Insufficient documentation

## 2015-08-07 DIAGNOSIS — I2699 Other pulmonary embolism without acute cor pulmonale: Secondary | ICD-10-CM

## 2015-08-07 LAB — CBC WITH DIFFERENTIAL/PLATELET
BASOS ABS: 0.1 10*3/uL (ref 0.0–0.1)
BASOS PCT: 0 %
EOS ABS: 0.1 10*3/uL (ref 0.0–0.7)
EOS PCT: 0 %
HCT: 38 % (ref 36.0–46.0)
HEMOGLOBIN: 12.5 g/dL (ref 12.0–15.0)
LYMPHS ABS: 1.8 10*3/uL (ref 0.7–4.0)
Lymphocytes Relative: 12 %
MCH: 30.1 pg (ref 26.0–34.0)
MCHC: 32.9 g/dL (ref 30.0–36.0)
MCV: 91.6 fL (ref 78.0–100.0)
Monocytes Absolute: 1.3 10*3/uL — ABNORMAL HIGH (ref 0.1–1.0)
Monocytes Relative: 9 %
Neutro Abs: 11.3 10*3/uL — ABNORMAL HIGH (ref 1.7–7.7)
Neutrophils Relative %: 79 %
PLATELETS: 403 10*3/uL — AB (ref 150–400)
RBC: 4.15 MIL/uL (ref 3.87–5.11)
RDW: 13.9 % (ref 11.5–15.5)
WBC: 14.4 10*3/uL — AB (ref 4.0–10.5)

## 2015-08-07 LAB — BASIC METABOLIC PANEL
Anion gap: 9 (ref 5–15)
BUN: 17 mg/dL (ref 6–20)
CO2: 27 mmol/L (ref 22–32)
CREATININE: 1.14 mg/dL — AB (ref 0.44–1.00)
Calcium: 9 mg/dL (ref 8.9–10.3)
Chloride: 103 mmol/L (ref 101–111)
GFR calc Af Amer: 48 mL/min — ABNORMAL LOW (ref >60)
GFR, EST NON AFRICAN AMERICAN: 42 mL/min — AB (ref >60)
Glucose, Bld: 123 mg/dL — ABNORMAL HIGH (ref 65–99)
Potassium: 4 mmol/L (ref 3.5–5.1)
SODIUM: 139 mmol/L (ref 135–145)

## 2015-08-07 LAB — D-DIMER, QUANTITATIVE (NOT AT ARMC): D DIMER QUANT: 1 ug{FEU}/mL — AB (ref 0.00–0.50)

## 2015-08-07 LAB — SEDIMENTATION RATE: Sed Rate: 74 mm/hr — ABNORMAL HIGH (ref 0–22)

## 2015-08-07 NOTE — Progress Notes (Signed)
Patient ID: Amanda Hale, female   DOB: 05-Sep-1925, 82 y.o.   MRN: [id number]     Cardiology Office Note   Date:  08/07/2015   ID:  Amanda Hale, DOB [birth year], MRN [id number]  PCP:  Wende Neighbors, MD  Cardiologist:   Jenkins Rouge, MD   No chief complaint on file.     History of Present Illness: Amanda Hale is a 82 y.o. female who presents for evaluation of chest pain Seen by me for same over 3 years ago 2014 Rx with dexilant and SL nitro given but no stress test ordered.  CRF;s  HTN, elevated lipids and DM.  Lots of GI issues with  Schatzki's ring, hiatal hernia, reflux esophagitis.  Seen by primary Delphina Cahill today .  Reviewed his note.  Chest heaviness And pain with breathing A week ago woke up hurting in abdomen with radiation to back.  Vomiting for 72 hrs.  BS uncontrolled No pain the last week  Glucophage increased.  Takes aricept for dementia and citalopram and alprazolam for anxiety Still with pleuritic type pain on ispiration Trace LE edema    Past Medical History  Diagnosis Date  . HTN (hypertension)   . Dyslipidemia   . DM type 2 (diabetes mellitus, type 2) ([place])   . Schatzki's ring 2014  . Hiatal hernia 2014  . Reflux esophagitis 2014    erosive  . Tubular adenoma     Past Surgical History  Procedure Laterality Date  . Abdominal hysterectomy    . Rectal surgery      prolapsed rectum/hemorrhoids  . Breast biopsy    . Eye surgery      both  . Hip surgery      right x 2  . Appendectomy    . Colonoscopy N/A 09/08/2012    RMR: tubular adenoma, poor prep, needs surveillance April 2015   . Esophagogastroduodenoscopy (egd) with esophageal dilation N/A 09/08/2012    RMR: Schatzki's ring s/p 54- F dilation, erosive esophagitis, hiatal hernia, gastric/bulb erosions with mild pyloric stenosis. Negative path  . Colonoscopy  1990/1992/1996    [facility], multiple hyperplastic polyps     Current Outpatient Prescriptions  Medication Sig Dispense  Refill  . Casanthranol-Docusate Sodium 30-100 MG CAPS Take 1 capsule by mouth as needed.    . citalopram (CELEXA) 20 MG tablet Take 20 mg by mouth daily.    Marland Kitchen glyBURIDE (DIABETA) 5 MG tablet Take 2.5 mg by mouth daily with breakfast.    . losartan-hydrochlorothiazide (HYZAAR) 100-12.5 MG per tablet Take 1 tablet by mouth daily.    . meclizine (ANTIVERT) 25 MG tablet Take 25 mg by mouth 4 (four) times daily as needed.    . metFORMIN (GLUCOPHAGE-XR) 500 MG 24 hr tablet Take 500 mg by mouth 2 (two) times daily.    . traMADol (ULTRAM) 50 MG tablet Take 50 mg by mouth every 6 (six) hours as needed for pain.     No current facility-administered medications for this visit.    Allergies:   Review of patient's allergies indicates no known allergies.    Social History:  The patient  reports that she has never smoked. She does not have any smokeless tobacco history on file. She reports that she does not drink alcohol or use illicit drugs.   Family History:  The patient's family history includes Hypertension in her mother and son. There is no history of Colon cancer.    ROS:  Please see the history  of present illness.   Otherwise, review of systems are positive for none.   All other systems are reviewed and negative.    PHYSICAL EXAM: VS:  BP 132/70 mmHg  Pulse 87  Ht _0  (1.499 m)  Wt 65.318 kg (144 lb)  BMI 29.07 kg/m2  SpO2 97% , BMI Body mass index is 29.07 kg/(m^2). Affect appropriate Frail elderly female  HEENT: normal Neck supple with no adenopathy JVP normal no bruits no thyromegaly Lungs clear with no wheezing and good diaphragmatic motion Heart:  S1/S2 no murmur, no rub, gallop or click PMI normal Abdomen: benighn, BS positve, no tenderness, no AAA no bruit.  No HSM or HJR Distal pulses intact with no bruits Trace bilateral  edema Neuro non-focal Skin warm and dry No muscular weakness    EKG:  07/27/12  SR rate 80 low voltage poor R wave progression cannot r/o old  anterior MI  08/07/15  SR normal ECG    Recent Labs: No results found for requested labs within last 365 days.    Lipid Panel No results found for: CHOL, TRIG, HDL, CHOLHDL, VLDL, LDLCALC, LDLDIRECT    Wt Readings from Last 3 Encounters:  08/07/15 65.318 kg (144 lb)  02/21/13 87.635 kg (193 lb 3.2 oz)  12/30/12 86.274 kg (190 lb 3.2 oz)      Other studies Reviewed: Additional studies/ records that were reviewed today include: Primary care notes and cardiology notes 2014  See HPI.    ASSESSMENT AND PLAN:  1.  Dyspnea:  Pleuritic sounding doubt angina.  Check CXR.  Labs to include d dimer and ESR.  Echo to assess RV and LV function would not pursue lexiscan myovue if CXR and echo normal given age and lack of previous disease with atypical symptoms 2. DM:  Discussed low carb diet.  Target hemoglobin A1c is 6.5 or less.  Continue current medications. 3. HTN:  Well controlled.  Continue current medications and low sodium Dash type diet.   4. GI:  Has lost some weight and has multiple GI issues colonoscopy fairly recently ok  F/u GI/ primary    Current medicines are reviewed at length with the patient today.  The patient does not have concerns regarding medicines.  The following changes have been made:  no change  Labs/ tests ordered today include:  Labs CXR and Echo   No orders of the defined types were placed in this encounter.     Disposition:   FU with PA after tests in [place]      Signed, Jenkins Rouge, MD  08/07/2015 2:44 PM    [facility] [address], [place], [place]  [address] Phone: [phone]; Fax: [phone]

## 2015-08-07 NOTE — Patient Instructions (Signed)
Your physician recommends that you schedule a follow-up appointment As Needed  Your physician recommends that you have lab work done today.  Have chest X-ray done today.  Your physician has requested that you have an echocardiogram. Echocardiography is a painless test that uses sound waves to create images of your heart. It provides your doctor with information about the size and shape of your heart and how well your heart's chambers and valves are working. This procedure takes approximately one hour. There are no restrictions for this procedure.   If you need a refill on your cardiac medications before your next appointment, please call your pharmacy.  Thank you for choosing [facility]!

## 2015-08-07 NOTE — Telephone Encounter (Signed)
-----   Message from Josue Hector, MD sent at 08/07/2015  4:02 PM EDT ----- D dimer up CXR ok need CTA to r/o PE in morning

## 2015-08-07 NOTE — Telephone Encounter (Signed)
Called patient with test results. No answer. Left message to call back.  

## 2015-08-08 ENCOUNTER — Ambulatory Visit (HOSPITAL_COMMUNITY)
Admission: RE | Admit: 2015-08-08 | Discharge: 2015-08-08 | Disposition: A | Discharge status: Home / Self Care | Payer: Medicare Other | Source: Ambulatory Visit | Attending: Cardiovascular Disease | Admitting: Cardiovascular Disease

## 2015-08-08 DIAGNOSIS — R0602 Shortness of breath: Secondary | ICD-10-CM | POA: Diagnosis not present

## 2015-08-08 DIAGNOSIS — J90 Pleural effusion, not elsewhere classified: Secondary | ICD-10-CM | POA: Insufficient documentation

## 2015-08-08 DIAGNOSIS — I2699 Other pulmonary embolism without acute cor pulmonale: Secondary | ICD-10-CM | POA: Diagnosis not present

## 2015-08-08 DIAGNOSIS — I313 Pericardial effusion (noninflammatory): Secondary | ICD-10-CM | POA: Diagnosis not present

## 2015-08-08 MED ORDER — IOHEXOL 350 MG/ML SOLN
80.0000 mL | Freq: Once | INTRAVENOUS | Status: AC | PRN
  Administered 2015-08-08: 80 mL via INTRAVENOUS

## 2015-08-12 ENCOUNTER — Ambulatory Visit (HOSPITAL_COMMUNITY)
Admission: RE | Admit: 2015-08-12 | Discharge: 2015-08-12 | Disposition: A | Discharge status: Home / Self Care | Payer: Medicare Other | Source: Ambulatory Visit | Attending: Cardiovascular Disease | Admitting: Cardiovascular Disease

## 2015-08-12 DIAGNOSIS — I358 Other nonrheumatic aortic valve disorders: Secondary | ICD-10-CM | POA: Diagnosis not present

## 2015-08-12 DIAGNOSIS — I119 Hypertensive heart disease without heart failure: Secondary | ICD-10-CM | POA: Diagnosis not present

## 2015-08-12 DIAGNOSIS — R091 Pleurisy: Secondary | ICD-10-CM | POA: Diagnosis not present

## 2015-08-12 DIAGNOSIS — R0600 Dyspnea, unspecified: Secondary | ICD-10-CM | POA: Insufficient documentation

## 2015-08-12 DIAGNOSIS — I059 Rheumatic mitral valve disease, unspecified: Secondary | ICD-10-CM | POA: Insufficient documentation

## 2015-08-12 DIAGNOSIS — E785 Hyperlipidemia, unspecified: Secondary | ICD-10-CM | POA: Insufficient documentation

## 2015-08-12 DIAGNOSIS — E119 Type 2 diabetes mellitus without complications: Secondary | ICD-10-CM | POA: Diagnosis not present

## 2015-08-14 ENCOUNTER — Telehealth: Payer: Self-pay | Admitting: Cardiovascular Disease

## 2015-08-14 NOTE — Telephone Encounter (Signed)
Patient notified of test results. Pt voiced understanding.

## 2015-08-14 NOTE — Telephone Encounter (Signed)
Called patient back. Per Dr. Nishan, she has pleurisy, echo was okay, CTA showed no PE, and ESR elevated. Primary was sent note, echo results, and CTA results. Inform her to follow up with Dr. Hall. Patient verbalized understanding and will call PCP. 

## 2015-08-14 NOTE — Telephone Encounter (Signed)
Results of ultrasound / tg  °

## 2015-08-14 NOTE — Telephone Encounter (Signed)
New message ° ° ° ° ° °Returning a nurses call to get echo results °

## 2015-08-14 NOTE — Telephone Encounter (Signed)
Patient given echo results. Patient still complaining about SOB, and chest hurts when patient takes a deep breath. Will sent to Dr. Johnsie Cancel for further advisement.

## 2015-08-14 NOTE — Telephone Encounter (Signed)
She has pleurisy echo ok CTA no PE ESR elevated I sent note to primary before About ? Starting steroids Needs to f/u with Delphina Cahill

## 2015-08-21 DIAGNOSIS — G894 Chronic pain syndrome: Secondary | ICD-10-CM | POA: Diagnosis not present

## 2015-08-21 DIAGNOSIS — F0390 Unspecified dementia without behavioral disturbance: Secondary | ICD-10-CM | POA: Diagnosis not present

## 2015-08-21 DIAGNOSIS — K222 Esophageal obstruction: Secondary | ICD-10-CM | POA: Diagnosis not present

## 2015-08-21 DIAGNOSIS — I10 Essential (primary) hypertension: Secondary | ICD-10-CM | POA: Diagnosis not present

## 2015-08-22 ENCOUNTER — Encounter: Payer: Self-pay | Admitting: Internal Medicine

## 2015-09-02 DIAGNOSIS — G894 Chronic pain syndrome: Secondary | ICD-10-CM | POA: Diagnosis not present

## 2015-09-02 DIAGNOSIS — M6281 Muscle weakness (generalized): Secondary | ICD-10-CM | POA: Diagnosis not present

## 2015-09-02 DIAGNOSIS — N182 Chronic kidney disease, stage 2 (mild): Secondary | ICD-10-CM | POA: Diagnosis not present

## 2015-09-02 DIAGNOSIS — I129 Hypertensive chronic kidney disease with stage 1 through stage 4 chronic kidney disease, or unspecified chronic kidney disease: Secondary | ICD-10-CM | POA: Diagnosis not present

## 2015-09-12 DIAGNOSIS — M6281 Muscle weakness (generalized): Secondary | ICD-10-CM | POA: Diagnosis not present

## 2015-09-12 DIAGNOSIS — I129 Hypertensive chronic kidney disease with stage 1 through stage 4 chronic kidney disease, or unspecified chronic kidney disease: Secondary | ICD-10-CM | POA: Diagnosis not present

## 2015-09-12 DIAGNOSIS — Z96641 Presence of right artificial hip joint: Secondary | ICD-10-CM | POA: Diagnosis not present

## 2015-09-12 DIAGNOSIS — G894 Chronic pain syndrome: Secondary | ICD-10-CM | POA: Diagnosis not present

## 2015-09-12 DIAGNOSIS — F0390 Unspecified dementia without behavioral disturbance: Secondary | ICD-10-CM | POA: Diagnosis not present

## 2015-09-12 DIAGNOSIS — R2689 Other abnormalities of gait and mobility: Secondary | ICD-10-CM | POA: Diagnosis not present

## 2015-09-12 DIAGNOSIS — Z7984 Long term (current) use of oral hypoglycemic drugs: Secondary | ICD-10-CM | POA: Diagnosis not present

## 2015-09-12 DIAGNOSIS — K222 Esophageal obstruction: Secondary | ICD-10-CM | POA: Diagnosis not present

## 2015-09-12 DIAGNOSIS — N182 Chronic kidney disease, stage 2 (mild): Secondary | ICD-10-CM | POA: Diagnosis not present

## 2015-09-12 DIAGNOSIS — F411 Generalized anxiety disorder: Secondary | ICD-10-CM | POA: Diagnosis not present

## 2015-09-12 DIAGNOSIS — E1122 Type 2 diabetes mellitus with diabetic chronic kidney disease: Secondary | ICD-10-CM | POA: Diagnosis not present

## 2015-09-13 ENCOUNTER — Ambulatory Visit (INDEPENDENT_AMBULATORY_CARE_PROVIDER_SITE_OTHER): Payer: Medicare Other | Admitting: Gastroenterology

## 2015-09-13 ENCOUNTER — Encounter: Payer: Self-pay | Admitting: Gastroenterology

## 2015-09-13 VITALS — BP 144/73 | HR 87 | Temp 96.5°F | Ht 59.0 in | Wt 140.6 lb

## 2015-09-13 DIAGNOSIS — K224 Dyskinesia of esophagus: Secondary | ICD-10-CM | POA: Diagnosis not present

## 2015-09-13 DIAGNOSIS — R634 Abnormal weight loss: Secondary | ICD-10-CM | POA: Diagnosis not present

## 2015-09-13 DIAGNOSIS — K219 Gastro-esophageal reflux disease without esophagitis: Secondary | ICD-10-CM | POA: Insufficient documentation

## 2015-09-13 DIAGNOSIS — Z86010 Personal history of colonic polyps: Secondary | ICD-10-CM

## 2015-09-13 DIAGNOSIS — R1010 Upper abdominal pain, unspecified: Secondary | ICD-10-CM | POA: Diagnosis not present

## 2015-09-13 NOTE — Progress Notes (Signed)
Primary Care Physician:  Wende Neighbors, MD  Primary Gastroenterologist:  Garfield Cornea, MD   Chief Complaint  Patient presents with  . Abdominal Pain  . Gastroesophageal Reflux    HPI:  Amanda Hale is a 82 y.o. female here at the request of Dr. [place] Crane for further evaluation of reflux esophagitis, dysphagia, history of colon polyps. Patient presents with her daughter today. She has a history of dysphagia, nocturnal coughing. Extensive evaluation including EGD/ED in 08/2012 with ERE, s/p dilation of Schatzki's ring, gastric/bulbar erosions with mild pyloric stenosis (biopsies were negative), barium esophagram showed age-related impairment of esophageal motility (patient could not swallow barium tablet), MBSS unremarkable. The symptoms apparently had settled down. We have not seen the patient since that time.  First of march, in middle of night, circulular pain in abdomen and if tried to take deep breath then worse. One week or more of vomiting 3-4 times per week. With a deep breath she would have pain into back from substernal region. Hiccups and throat clearing. Food goes down okay. Pain in belly went away. No vomiting in two weeks. If skips a day without bm then takes a laxative. Stools dark. No blood. 3 Hemoccult cards negative per patient. Patient did see cardiology recently because she was having some chest discomfort. Diagnosed with pleurisy. Echocardiogram was okay, CTA showed no PE. Sedimentation rate was 74.  Of note patient has lost about 50 pounds since 2014.   Current Outpatient Prescriptions  Medication Sig Dispense Refill  . Casanthranol-Docusate Sodium 30-100 MG CAPS Take 1 capsule by mouth as needed.    . citalopram (CELEXA) 20 MG tablet Take 20 mg by mouth daily.    Marland Kitchen glyBURIDE (DIABETA) 5 MG tablet Take 2.5 mg by mouth daily with breakfast.    . losartan-hydrochlorothiazide (HYZAAR) 100-12.5 MG per tablet Take 1 tablet by mouth daily.    . meclizine (ANTIVERT) 25 MG tablet Take 25  mg by mouth 4 (four) times daily as needed.    . metFORMIN (GLUCOPHAGE-XR) 500 MG 24 hr tablet Take 500 mg by mouth 2 (two) times daily.    . traMADol (ULTRAM) 50 MG tablet Take 50 mg by mouth every 6 (six) hours as needed for pain.     No current facility-administered medications for this visit.    Allergies as of 09/13/2015  . (No Known Allergies)    Past Medical History  Diagnosis Date  . HTN (hypertension)   . Dyslipidemia   . DM type 2 (diabetes mellitus, type 2) ([place])   . Schatzki's ring 2014  . Hiatal hernia 2014  . Reflux esophagitis 2014    erosive  . Tubular adenoma     Past Surgical History  Procedure Laterality Date  . Abdominal hysterectomy    . Rectal surgery      prolapsed rectum/hemorrhoids  . Breast biopsy    . Eye surgery      both  . Hip surgery      right x 2  . Appendectomy    . Colonoscopy N/A 09/08/2012    RMR: tubular adenoma, poor prep, needs surveillance April 2015   . Esophagogastroduodenoscopy (egd) with esophageal dilation N/A 09/08/2012    RMR: Schatzki's ring s/p 54- F dilation, erosive esophagitis, hiatal hernia, gastric/bulb erosions with mild pyloric stenosis. Negative path  . Colonoscopy  1990/1992/1996    [facility], multiple hyperplastic polyps    Family History  Problem Relation Age of Onset  . Hypertension Mother   . Hypertension  Son   . Colon cancer Neg Hx     Social History   Social History  . Marital Status: Divorced    Spouse Name: N/A  . Number of Children: N/A  . Years of Education: N/A   Occupational History  . Not on file.   Social History Main Topics  . Smoking status: Never Smoker   . Smokeless tobacco: Not on file  . Alcohol Use: No  . Drug Use: No  . Sexual Activity: Not on file   Other Topics Concern  . Not on file   Social History Narrative      ROS:  General: Negative for anorexia,  fever, chills, fatigue, weakness.Positive weight loss, see history of present illness Eyes:  Negative for vision changes.  ENT: Negative for hoarseness, difficulty swallowing , nasal congestion. CV: Negative for chest pain, angina, palpitations, dyspnea on exertion, peripheral edema.  Respiratory: Negative for dyspnea at rest, dyspnea on exertion, cough, sputum, wheezing.  GI: See history of present illness. GU:  Negative for dysuria, hematuria, urinary incontinence, urinary frequency, nocturnal urination.  MS: Negative for joint pain, low back pain.  Derm: Negative for rash or itching.  Neuro: Negative for weakness, abnormal sensation, seizure, frequent headaches, memory loss, confusion.  Psych: Negative for anxiety, depression, suicidal ideation, hallucinations.  Endo: See history of present illness Heme: Negative for bruising or bleeding. Allergy: Negative for rash or hives.    Physical Examination:  BP 144/73 mmHg  Pulse 87  Temp(Src) 96.5 F (35.8 C)  Ht 4\' 11"  (1.499 m)  Wt 140 lb 9.6 oz (63.776 kg)  BMI 28.38 kg/m2   General: Well-nourished, well-developed in no acute distress. Accompanied by daughter Head: Normocephalic, atraumatic.   Eyes: Conjunctiva pink, no icterus. Mouth: Oropharyngeal mucosa moist and pink , no lesions erythema or exudate. Neck: Supple without thyromegaly, masses, or lymphadenopathy.  Lungs: Clear to auscultation bilaterally.  Heart: Regular rate and rhythm, no murmurs rubs or gallops.  Abdomen: Bowel sounds are normal, nontender, nondistended, no hepatosplenomegaly or masses, no abdominal bruits or    hernia , no rebound or guarding.   Rectal: Not performed Extremities: No lower extremity edema. No clubbing or deformities.  Neuro: Alert and oriented x 4 , grossly normal neurologically.  Skin: Warm and dry, no rash or jaundice.   Psych: Alert and cooperative, normal mood and affect.  Labs: Lab Results  Component Value Date   CREATININE 1.14* 08/07/2015   BUN 17 08/07/2015   NA 139 08/07/2015   K 4.0 08/07/2015   CL 103 08/07/2015    CO2 27 08/07/2015   Lab Results  Component Value Date   WBC 14.4* 08/07/2015   HGB 12.5 08/07/2015   HCT 38.0 08/07/2015   MCV 91.6 08/07/2015   PLT 403* 08/07/2015   Lab Results  Component Value Date   ESRSEDRATE 74* 08/07/2015   Lab Results  Component Value Date   DDIMER 1.00* 08/07/2015     Imaging Studies: No results found.   Impression/plan: 82 year old female who presents for further evaluation of upper abdominal pain, recent nausea/vomiting, history of reflux esophagitis, noted marked weight loss over the past 3-4 years, personal history of colonic polyps. Recent abdominal pain associated with vomiting, chest discomfort with deep breath. Evaluated by cardiology and felt to have pleurisy. Sedimentation rate elevated at 74. Vomiting resolved, abdominal pain improved now back on PPI. Patient denies any problems swallowing her food.   Personal history of colonic polyps with poor prep home procedure 2014. Recommended  2015 follow-up colonoscopy which was not done. Question now arises of with or not we should pursue colonoscopy for purposes of surveillance of colon polyps. Given her age, would not necessarily encourage surveillance colonoscopy however if weight loss does not improve, may consider. Will discuss further with Dr. Gala Romney.  With regards to upper GI symptoms, suspect flare of reflux esophagitis in part, she does have a history of pyloric stenosis and cannot exclude as a contributing factor. Patient feels that she is improved on PPI therapy. We discussed options of BPE/upper GI series versus upper endoscopy. Patient is open to monitoring symptoms a little bit longer given improvement on PPI. We'll have her come back to see [place] in 4-6 weeks for reevaluation but she knows to call sooner if symptoms worsen.

## 2015-09-13 NOTE — Patient Instructions (Signed)
1. Continue pantoprazole once daily.  2. Return to the office in 4-6 weeks for reevaluation. If symptoms worse or change please call. Options are available of xray to look at esophagus and stomach OR upper endoscopy. 3. I will make sure Dr. Gala Romney agrees with no need for surveillance colonoscopy given your age. If any additional recommendations, we will call.

## 2015-09-18 DIAGNOSIS — F0390 Unspecified dementia without behavioral disturbance: Secondary | ICD-10-CM | POA: Diagnosis not present

## 2015-09-18 DIAGNOSIS — I129 Hypertensive chronic kidney disease with stage 1 through stage 4 chronic kidney disease, or unspecified chronic kidney disease: Secondary | ICD-10-CM | POA: Diagnosis not present

## 2015-09-18 DIAGNOSIS — E1122 Type 2 diabetes mellitus with diabetic chronic kidney disease: Secondary | ICD-10-CM | POA: Diagnosis not present

## 2015-09-18 DIAGNOSIS — G894 Chronic pain syndrome: Secondary | ICD-10-CM | POA: Diagnosis not present

## 2015-09-18 DIAGNOSIS — M6281 Muscle weakness (generalized): Secondary | ICD-10-CM | POA: Diagnosis not present

## 2015-09-18 DIAGNOSIS — N182 Chronic kidney disease, stage 2 (mild): Secondary | ICD-10-CM | POA: Diagnosis not present

## 2015-09-18 NOTE — Progress Notes (Signed)
cc'ed to pcp °

## 2015-09-20 DIAGNOSIS — E1122 Type 2 diabetes mellitus with diabetic chronic kidney disease: Secondary | ICD-10-CM | POA: Diagnosis not present

## 2015-09-20 DIAGNOSIS — I129 Hypertensive chronic kidney disease with stage 1 through stage 4 chronic kidney disease, or unspecified chronic kidney disease: Secondary | ICD-10-CM | POA: Diagnosis not present

## 2015-09-20 DIAGNOSIS — N182 Chronic kidney disease, stage 2 (mild): Secondary | ICD-10-CM | POA: Diagnosis not present

## 2015-09-20 DIAGNOSIS — M6281 Muscle weakness (generalized): Secondary | ICD-10-CM | POA: Diagnosis not present

## 2015-09-20 DIAGNOSIS — G894 Chronic pain syndrome: Secondary | ICD-10-CM | POA: Diagnosis not present

## 2015-09-20 DIAGNOSIS — F0390 Unspecified dementia without behavioral disturbance: Secondary | ICD-10-CM | POA: Diagnosis not present

## 2015-09-20 NOTE — Progress Notes (Signed)
Please let patient know that right now Dr. Gala Romney does not recommend colonoscopy. Depending on how you do when he sees you at the end of the month, he may consider going ahead with upper endoscopy.  She needs to have a repeat sedimentation rate about a week before her upcoming office visit.

## 2015-09-23 NOTE — Progress Notes (Signed)
Pt called to see if she was scheduled for an TCS/EGD and I told her no than she needed to see RMR in the office first. She is aware of the office appointment

## 2015-09-24 DIAGNOSIS — E1151 Type 2 diabetes mellitus with diabetic peripheral angiopathy without gangrene: Secondary | ICD-10-CM | POA: Diagnosis not present

## 2015-09-24 DIAGNOSIS — L609 Nail disorder, unspecified: Secondary | ICD-10-CM | POA: Diagnosis not present

## 2015-09-24 DIAGNOSIS — L110 Acquired keratosis follicularis: Secondary | ICD-10-CM | POA: Diagnosis not present

## 2015-09-25 DIAGNOSIS — N182 Chronic kidney disease, stage 2 (mild): Secondary | ICD-10-CM | POA: Diagnosis not present

## 2015-09-25 DIAGNOSIS — I129 Hypertensive chronic kidney disease with stage 1 through stage 4 chronic kidney disease, or unspecified chronic kidney disease: Secondary | ICD-10-CM | POA: Diagnosis not present

## 2015-09-25 DIAGNOSIS — M6281 Muscle weakness (generalized): Secondary | ICD-10-CM | POA: Diagnosis not present

## 2015-09-25 DIAGNOSIS — F0390 Unspecified dementia without behavioral disturbance: Secondary | ICD-10-CM | POA: Diagnosis not present

## 2015-09-25 DIAGNOSIS — E1122 Type 2 diabetes mellitus with diabetic chronic kidney disease: Secondary | ICD-10-CM | POA: Diagnosis not present

## 2015-09-25 DIAGNOSIS — G894 Chronic pain syndrome: Secondary | ICD-10-CM | POA: Diagnosis not present

## 2015-09-27 DIAGNOSIS — I129 Hypertensive chronic kidney disease with stage 1 through stage 4 chronic kidney disease, or unspecified chronic kidney disease: Secondary | ICD-10-CM | POA: Diagnosis not present

## 2015-09-27 DIAGNOSIS — E1122 Type 2 diabetes mellitus with diabetic chronic kidney disease: Secondary | ICD-10-CM | POA: Diagnosis not present

## 2015-09-27 DIAGNOSIS — F0390 Unspecified dementia without behavioral disturbance: Secondary | ICD-10-CM | POA: Diagnosis not present

## 2015-09-27 DIAGNOSIS — M6281 Muscle weakness (generalized): Secondary | ICD-10-CM | POA: Diagnosis not present

## 2015-09-27 DIAGNOSIS — G894 Chronic pain syndrome: Secondary | ICD-10-CM | POA: Diagnosis not present

## 2015-09-27 DIAGNOSIS — N182 Chronic kidney disease, stage 2 (mild): Secondary | ICD-10-CM | POA: Diagnosis not present

## 2015-10-01 ENCOUNTER — Other Ambulatory Visit: Payer: Self-pay | Admitting: Gastroenterology

## 2015-10-01 ENCOUNTER — Other Ambulatory Visit: Payer: Self-pay

## 2015-10-01 DIAGNOSIS — R1010 Upper abdominal pain, unspecified: Secondary | ICD-10-CM

## 2015-10-01 NOTE — Progress Notes (Signed)
Lab order done and mailed to the pt.  

## 2015-10-02 DIAGNOSIS — E1122 Type 2 diabetes mellitus with diabetic chronic kidney disease: Secondary | ICD-10-CM | POA: Diagnosis not present

## 2015-10-02 DIAGNOSIS — F0390 Unspecified dementia without behavioral disturbance: Secondary | ICD-10-CM | POA: Diagnosis not present

## 2015-10-02 DIAGNOSIS — I129 Hypertensive chronic kidney disease with stage 1 through stage 4 chronic kidney disease, or unspecified chronic kidney disease: Secondary | ICD-10-CM | POA: Diagnosis not present

## 2015-10-02 DIAGNOSIS — M6281 Muscle weakness (generalized): Secondary | ICD-10-CM | POA: Diagnosis not present

## 2015-10-02 DIAGNOSIS — N182 Chronic kidney disease, stage 2 (mild): Secondary | ICD-10-CM | POA: Diagnosis not present

## 2015-10-02 DIAGNOSIS — G894 Chronic pain syndrome: Secondary | ICD-10-CM | POA: Diagnosis not present

## 2015-10-04 DIAGNOSIS — F0390 Unspecified dementia without behavioral disturbance: Secondary | ICD-10-CM | POA: Diagnosis not present

## 2015-10-04 DIAGNOSIS — G894 Chronic pain syndrome: Secondary | ICD-10-CM | POA: Diagnosis not present

## 2015-10-04 DIAGNOSIS — M6281 Muscle weakness (generalized): Secondary | ICD-10-CM | POA: Diagnosis not present

## 2015-10-04 DIAGNOSIS — E1122 Type 2 diabetes mellitus with diabetic chronic kidney disease: Secondary | ICD-10-CM | POA: Diagnosis not present

## 2015-10-04 DIAGNOSIS — N182 Chronic kidney disease, stage 2 (mild): Secondary | ICD-10-CM | POA: Diagnosis not present

## 2015-10-04 DIAGNOSIS — I129 Hypertensive chronic kidney disease with stage 1 through stage 4 chronic kidney disease, or unspecified chronic kidney disease: Secondary | ICD-10-CM | POA: Diagnosis not present

## 2015-10-08 ENCOUNTER — Encounter: Payer: Self-pay | Admitting: Internal Medicine

## 2015-10-08 ENCOUNTER — Other Ambulatory Visit (HOSPITAL_COMMUNITY)
Admission: RE | Admit: 2015-10-08 | Discharge: 2015-10-08 | Disposition: A | Discharge status: Home / Self Care | Payer: Medicare Other | Source: Ambulatory Visit | Attending: Internal Medicine | Admitting: Internal Medicine

## 2015-10-08 ENCOUNTER — Other Ambulatory Visit: Payer: Self-pay

## 2015-10-08 ENCOUNTER — Ambulatory Visit (INDEPENDENT_AMBULATORY_CARE_PROVIDER_SITE_OTHER): Payer: Medicare Other | Admitting: Internal Medicine

## 2015-10-08 VITALS — BP 151/71 | HR 99 | Temp 96.9°F | Ht 59.0 in | Wt 141.0 lb

## 2015-10-08 DIAGNOSIS — R079 Chest pain, unspecified: Secondary | ICD-10-CM

## 2015-10-08 DIAGNOSIS — F0390 Unspecified dementia without behavioral disturbance: Secondary | ICD-10-CM | POA: Diagnosis not present

## 2015-10-08 DIAGNOSIS — R1010 Upper abdominal pain, unspecified: Secondary | ICD-10-CM | POA: Diagnosis not present

## 2015-10-08 DIAGNOSIS — R109 Unspecified abdominal pain: Secondary | ICD-10-CM | POA: Insufficient documentation

## 2015-10-08 DIAGNOSIS — N182 Chronic kidney disease, stage 2 (mild): Secondary | ICD-10-CM | POA: Diagnosis not present

## 2015-10-08 DIAGNOSIS — K219 Gastro-esophageal reflux disease without esophagitis: Secondary | ICD-10-CM

## 2015-10-08 DIAGNOSIS — G894 Chronic pain syndrome: Secondary | ICD-10-CM | POA: Diagnosis not present

## 2015-10-08 DIAGNOSIS — I129 Hypertensive chronic kidney disease with stage 1 through stage 4 chronic kidney disease, or unspecified chronic kidney disease: Secondary | ICD-10-CM | POA: Diagnosis not present

## 2015-10-08 DIAGNOSIS — M6281 Muscle weakness (generalized): Secondary | ICD-10-CM | POA: Diagnosis not present

## 2015-10-08 DIAGNOSIS — E1122 Type 2 diabetes mellitus with diabetic chronic kidney disease: Secondary | ICD-10-CM | POA: Diagnosis not present

## 2015-10-08 LAB — CBC WITH DIFFERENTIAL/PLATELET
Basophils Absolute: 0.1 10*3/uL (ref 0.0–0.1)
Basophils Relative: 1 %
Eosinophils Absolute: 0.2 10*3/uL (ref 0.0–0.7)
Eosinophils Relative: 2 %
HEMATOCRIT: 38.8 % (ref 36.0–46.0)
Hemoglobin: 12.1 g/dL (ref 12.0–15.0)
LYMPHS PCT: 21 %
Lymphs Abs: 1.6 10*3/uL (ref 0.7–4.0)
MCH: 28.3 pg (ref 26.0–34.0)
MCHC: 31.2 g/dL (ref 30.0–36.0)
MCV: 90.7 fL (ref 78.0–100.0)
MONO ABS: 0.4 10*3/uL (ref 0.1–1.0)
Monocytes Relative: 6 %
NEUTROS ABS: 5.2 10*3/uL (ref 1.7–7.7)
Neutrophils Relative %: 70 %
Platelets: 354 10*3/uL (ref 150–400)
RBC: 4.28 MIL/uL (ref 3.87–5.11)
RDW: 14.9 % (ref 11.5–15.5)
WBC: 7.4 10*3/uL (ref 4.0–10.5)

## 2015-10-08 LAB — SEDIMENTATION RATE: Sed Rate: 25 mm/hr — ABNORMAL HIGH (ref 0–22)

## 2015-10-08 NOTE — Progress Notes (Signed)
Primary Care Physician:  Wende Neighbors, MD Primary Gastroenterologist:  Dr. Gala Romney  Pre-Procedure History & Physical: HPI:  Amanda Hale is a 82 y.o. female here for  To further evaluate atypical chest pain. Cardiac evaluation negative. She states a recent two-month course of Protonix 40 mg daily has helped, but not totally alleviate, her chest symptoms. No odynophagia or dysphagia. Complains of stick a stiff shoulder and neck. I cannot mildly elevated white count of 14,002 months ago and his sed rate of greater than 70.  History multiple colonic adenomas removed: Previously. Tentatively,  set up for repeat colonoscopy for surveillance purposes later this year.  Patient reports vague upper abdominal pain from time to time. Occasionally with early morning "attack" of pain. Resolve spontaneously over an hour or so. Gallbladder remains in situ. Patient has chronic constipation managed manages it very well with stool softeners and occasional OTC laxatives.  Past Medical History  Diagnosis Date  . HTN (hypertension)   . Dyslipidemia   . DM type 2 (diabetes mellitus, type 2) ([place])   . Schatzki's ring 2014  . Hiatal hernia 2014  . Reflux esophagitis 2014    erosive  . Tubular adenoma     Past Surgical History  Procedure Laterality Date  . Abdominal hysterectomy    . Rectal surgery      prolapsed rectum/hemorrhoids  . Breast biopsy    . Eye surgery      both  . Hip surgery      right x 2  . Appendectomy    . Colonoscopy N/A 09/08/2012    RMR: tubular adenoma, poor prep, needs surveillance April 2015   . Esophagogastroduodenoscopy (egd) with esophageal dilation N/A 09/08/2012    RMR: Schatzki's ring s/p 54- F dilation, erosive esophagitis, hiatal hernia, gastric/bulb erosions with mild pyloric stenosis. Negative path  . Colonoscopy  1990/1992/1996    [facility], multiple hyperplastic polyps    Prior to Admission medications   Medication Sig Start Date End Date Taking?  Authorizing Provider  Casanthranol-Docusate Sodium 30-100 MG CAPS Take 1 capsule by mouth as needed.    Historical Provider, MD  citalopram (CELEXA) 20 MG tablet Take 20 mg by mouth daily.    Historical Provider, MD  glyBURIDE (DIABETA) 5 MG tablet Take 2.5 mg by mouth daily with breakfast.    Historical Provider, MD  losartan-hydrochlorothiazide (HYZAAR) 100-12.5 MG per tablet Take 1 tablet by mouth daily.    Historical Provider, MD  meclizine (ANTIVERT) 25 MG tablet Take 25 mg by mouth 4 (four) times daily as needed.    Historical Provider, MD  metFORMIN (GLUCOPHAGE-XR) 500 MG 24 hr tablet Take 500 mg by mouth 2 (two) times daily.    Historical Provider, MD  traMADol (ULTRAM) 50 MG tablet Take 50 mg by mouth every 6 (six) hours as needed for pain.    Historical Provider, MD    Allergies as of 10/08/2015  . (No Known Allergies)    Family History  Problem Relation Age of Onset  . Hypertension Mother   . Hypertension Son   . Colon cancer Neg Hx     Social History   Social History  . Marital Status: Divorced    Spouse Name: N/A  . Number of Children: N/A  . Years of Education: N/A   Occupational History  . Not on file.   Social History Main Topics  . Smoking status: Never Smoker   . Smokeless tobacco: Not on file  . Alcohol  Use: No  . Drug Use: No  . Sexual Activity: Not on file   Other Topics Concern  . Not on file   Social History Narrative    Review of Systems: See HPI, otherwise negative ROS  Physical Exam: There were no vitals taken for this visit. General:   Frail, alert and oriented lady, pleasant and cooperative in NAD. Waits a walker. Examined in her chair. Accompanied by her daughter. Neck:  Supple; no masses or thyromegaly. No significant cervical adenopathy. Lungs:  Clear throughout to auscultation.   No wheezes, crackles, or rhonchi. No acute distress. Heart:  Regular rate and rhythm; no murmurs, clicks, rubs,  or gallops. Abdomen: Non-distended,  normal bowel sounds.  Soft and nontender without appreciable mass or hepatosplenomegaly.  Pulses:  Normal pulses noted. Extremities:  Without clubbing or edema.  Impression: Pleasant 82 year old lady with long-standing GERD and recent atypical chest pain - somewhat improved on Protonix 40 mg daily; Intermittent upper abdominal pain.  No dysphagia or odynophagia. The gallbladder remains in situ. Cardiac workup negative.    Recommendations:  We'll go ahead and offered this lady this nice lady a diagnostic EGD to further evaluate her symptoms.  The risks, benefits, limitations, alternatives and imponderables have been reviewed with the patient. Potential for esophageal dilation, biopsy, etc. have also been reviewed.  Questions have been answered. All parties agreeable. Will hold off on gallbladder evaluation at this time. As a separate issue, history of colonic polyps. She is currently 31. I do not feel that the another colonoscopy would the wanted as I do not feel the benefits would be worth the risk. This was discussed in some detail with patient and patient's daughter. All parties agreeable to not performing a colonoscopy unless new symptoms develop.  Am somewhat concerned about this lady's leukocytosis in particular her elevated sed rate observed weeks ago. With her upper body complaints I wonder she had PMR, etc. Lab abnormalities telemetry nonspecific. They should be rechecked.   CBC, ESR today  Schedule diagnostic EGD - diagnostic - conscious sedation  Continue Protonix 40 mg daily for now.  Further medications to follow.         Notice: This dictation was prepared with Dragon dictation along with smaller phrase technology. Any transcriptional errors that result from this process are unintentional and may not be corrected upon review.

## 2015-10-08 NOTE — Patient Instructions (Signed)
CBC, ESR today  Schedule diagnostic EGD - chest pain, GERD - conscious sedation

## 2015-10-09 ENCOUNTER — Encounter (HOSPITAL_COMMUNITY)
Admission: RE | Disposition: A | Discharge status: Home / Self Care | Payer: Self-pay | Source: Ambulatory Visit | Attending: Internal Medicine

## 2015-10-09 ENCOUNTER — Encounter (HOSPITAL_COMMUNITY): Payer: Self-pay | Admitting: *Deleted

## 2015-10-09 ENCOUNTER — Ambulatory Visit (HOSPITAL_COMMUNITY)
Admission: RE | Admit: 2015-10-09 | Discharge: 2015-10-09 | Disposition: A | Discharge status: Home / Self Care | Payer: Medicare Other | Source: Ambulatory Visit | Attending: Internal Medicine | Admitting: Internal Medicine

## 2015-10-09 DIAGNOSIS — K311 Adult hypertrophic pyloric stenosis: Secondary | ICD-10-CM | POA: Diagnosis not present

## 2015-10-09 DIAGNOSIS — R0789 Other chest pain: Secondary | ICD-10-CM | POA: Diagnosis not present

## 2015-10-09 DIAGNOSIS — E119 Type 2 diabetes mellitus without complications: Secondary | ICD-10-CM | POA: Insufficient documentation

## 2015-10-09 DIAGNOSIS — Z79899 Other long term (current) drug therapy: Secondary | ICD-10-CM | POA: Insufficient documentation

## 2015-10-09 DIAGNOSIS — E785 Hyperlipidemia, unspecified: Secondary | ICD-10-CM | POA: Diagnosis not present

## 2015-10-09 DIAGNOSIS — I10 Essential (primary) hypertension: Secondary | ICD-10-CM | POA: Diagnosis not present

## 2015-10-09 DIAGNOSIS — R079 Chest pain, unspecified: Secondary | ICD-10-CM

## 2015-10-09 DIAGNOSIS — Q400 Congenital hypertrophic pyloric stenosis: Secondary | ICD-10-CM | POA: Insufficient documentation

## 2015-10-09 DIAGNOSIS — R12 Heartburn: Secondary | ICD-10-CM | POA: Diagnosis not present

## 2015-10-09 DIAGNOSIS — K219 Gastro-esophageal reflux disease without esophagitis: Secondary | ICD-10-CM

## 2015-10-09 DIAGNOSIS — Z7984 Long term (current) use of oral hypoglycemic drugs: Secondary | ICD-10-CM | POA: Insufficient documentation

## 2015-10-09 HISTORY — PX: ESOPHAGOGASTRODUODENOSCOPY: SHX5428

## 2015-10-09 LAB — GLUCOSE, CAPILLARY: GLUCOSE-CAPILLARY: 104 mg/dL — AB (ref 65–99)

## 2015-10-09 SURGERY — EGD (ESOPHAGOGASTRODUODENOSCOPY)
Anesthesia: Moderate Sedation

## 2015-10-09 MED ORDER — MEPERIDINE HCL 100 MG/ML IJ SOLN
INTRAMUSCULAR | Status: AC
  Filled 2015-10-09: qty 2

## 2015-10-09 MED ORDER — SODIUM CHLORIDE 0.9 % IV SOLN
INTRAVENOUS | Status: DC
  Administered 2015-10-09: 20 mL/h via INTRAVENOUS

## 2015-10-09 MED ORDER — STERILE WATER FOR IRRIGATION IR SOLN
Status: DC | PRN
  Administered 2015-10-09: 15:00:00

## 2015-10-09 MED ORDER — LIDOCAINE VISCOUS 2 % MT SOLN
OROMUCOSAL | Status: DC | PRN
  Administered 2015-10-09: 3 mL via OROMUCOSAL

## 2015-10-09 MED ORDER — MIDAZOLAM HCL 5 MG/5ML IJ SOLN
INTRAMUSCULAR | Status: AC
  Filled 2015-10-09: qty 10

## 2015-10-09 MED ORDER — ONDANSETRON HCL 4 MG/2ML IJ SOLN
INTRAMUSCULAR | Status: AC
  Filled 2015-10-09: qty 2

## 2015-10-09 MED ORDER — MEPERIDINE HCL 100 MG/ML IJ SOLN
INTRAMUSCULAR | Status: DC | PRN
  Administered 2015-10-09 (×2): 25 mg via INTRAVENOUS

## 2015-10-09 MED ORDER — MIDAZOLAM HCL 5 MG/5ML IJ SOLN
INTRAMUSCULAR | Status: DC | PRN
  Administered 2015-10-09: 2 mg via INTRAVENOUS
  Administered 2015-10-09 (×2): 1 mg via INTRAVENOUS

## 2015-10-09 MED ORDER — ONDANSETRON HCL 4 MG/2ML IJ SOLN
INTRAMUSCULAR | Status: DC | PRN
  Administered 2015-10-09: 4 mg via INTRAVENOUS

## 2015-10-09 MED ORDER — LIDOCAINE VISCOUS 2 % MT SOLN
OROMUCOSAL | Status: AC
  Filled 2015-10-09: qty 15

## 2015-10-09 NOTE — H&P (View-Only) (Signed)
Primary Care Physician:  Wende Neighbors, MD Primary Gastroenterologist:  Dr. Gala Romney  Pre-Procedure History & Physical: HPI:  Amanda Hale is a 82 y.o. female here for  To further evaluate atypical chest pain. Cardiac evaluation negative. She states a recent two-month course of Protonix 40 mg daily has helped, but not totally alleviate, her chest symptoms. No odynophagia or dysphagia. Complains of stick a stiff shoulder and neck. I cannot mildly elevated white count of 14,002 months ago and his sed rate of greater than 70.  History multiple colonic adenomas removed: Previously. Tentatively,  set up for repeat colonoscopy for surveillance purposes later this year.  Patient reports vague upper abdominal pain from time to time. Occasionally with early morning "attack" of pain. Resolve spontaneously over an hour or so. Gallbladder remains in situ. Patient has chronic constipation managed manages it very well with stool softeners and occasional OTC laxatives.  Past Medical History  Diagnosis Date  . HTN (hypertension)   . Dyslipidemia   . DM type 2 (diabetes mellitus, type 2) ([place])   . Schatzki's ring 2014  . Hiatal hernia 2014  . Reflux esophagitis 2014    erosive  . Tubular adenoma     Past Surgical History  Procedure Laterality Date  . Abdominal hysterectomy    . Rectal surgery      prolapsed rectum/hemorrhoids  . Breast biopsy    . Eye surgery      both  . Hip surgery      right x 2  . Appendectomy    . Colonoscopy N/A 09/08/2012    RMR: tubular adenoma, poor prep, needs surveillance April 2015   . Esophagogastroduodenoscopy (egd) with esophageal dilation N/A 09/08/2012    RMR: Schatzki's ring s/p 54- F dilation, erosive esophagitis, hiatal hernia, gastric/bulb erosions with mild pyloric stenosis. Negative path  . Colonoscopy  1990/1992/1996    [facility], multiple hyperplastic polyps    Prior to Admission medications   Medication Sig Start Date End Date Taking?  Authorizing Provider  Casanthranol-Docusate Sodium 30-100 MG CAPS Take 1 capsule by mouth as needed.    Historical Provider, MD  citalopram (CELEXA) 20 MG tablet Take 20 mg by mouth daily.    Historical Provider, MD  glyBURIDE (DIABETA) 5 MG tablet Take 2.5 mg by mouth daily with breakfast.    Historical Provider, MD  losartan-hydrochlorothiazide (HYZAAR) 100-12.5 MG per tablet Take 1 tablet by mouth daily.    Historical Provider, MD  meclizine (ANTIVERT) 25 MG tablet Take 25 mg by mouth 4 (four) times daily as needed.    Historical Provider, MD  metFORMIN (GLUCOPHAGE-XR) 500 MG 24 hr tablet Take 500 mg by mouth 2 (two) times daily.    Historical Provider, MD  traMADol (ULTRAM) 50 MG tablet Take 50 mg by mouth every 6 (six) hours as needed for pain.    Historical Provider, MD    Allergies as of 10/08/2015  . (No Known Allergies)    Family History  Problem Relation Age of Onset  . Hypertension Mother   . Hypertension Son   . Colon cancer Neg Hx     Social History   Social History  . Marital Status: Divorced    Spouse Name: N/A  . Number of Children: N/A  . Years of Education: N/A   Occupational History  . Not on file.   Social History Main Topics  . Smoking status: Never Smoker   . Smokeless tobacco: Not on file  . Alcohol  Use: No  . Drug Use: No  . Sexual Activity: Not on file   Other Topics Concern  . Not on file   Social History Narrative    Review of Systems: See HPI, otherwise negative ROS  Physical Exam: There were no vitals taken for this visit. General:   Frail, alert and oriented lady, pleasant and cooperative in NAD. Waits a walker. Examined in her chair. Accompanied by her daughter. Neck:  Supple; no masses or thyromegaly. No significant cervical adenopathy. Lungs:  Clear throughout to auscultation.   No wheezes, crackles, or rhonchi. No acute distress. Heart:  Regular rate and rhythm; no murmurs, clicks, rubs,  or gallops. Abdomen: Non-distended,  normal bowel sounds.  Soft and nontender without appreciable mass or hepatosplenomegaly.  Pulses:  Normal pulses noted. Extremities:  Without clubbing or edema.  Impression: Pleasant 83 year old lady with long-standing GERD and recent atypical chest pain - somewhat improved on Protonix 40 mg daily; Intermittent upper abdominal pain.  No dysphagia or odynophagia. The gallbladder remains in situ. Cardiac workup negative.    Recommendations:  We'll go ahead and offered this lady this nice lady a diagnostic EGD to further evaluate her symptoms.  The risks, benefits, limitations, alternatives and imponderables have been reviewed with the patient. Potential for esophageal dilation, biopsy, etc. have also been reviewed.  Questions have been answered. All parties agreeable. Will hold off on gallbladder evaluation at this time. As a separate issue, history of colonic polyps. She is currently 35. I do not feel that the another colonoscopy would the wanted as I do not feel the benefits would be worth the risk. This was discussed in some detail with patient and patient's daughter. All parties agreeable to not performing a colonoscopy unless new symptoms develop.  Am somewhat concerned about this lady's leukocytosis in particular her elevated sed rate observed weeks ago. With her upper body complaints I wonder she had PMR, etc. Lab abnormalities telemetry nonspecific. They should be rechecked.   CBC, ESR today  Schedule diagnostic EGD - diagnostic - conscious sedation  Continue Protonix 40 mg daily for now.  Further medications to follow.         Notice: This dictation was prepared with Dragon dictation along with smaller phrase technology. Any transcriptional errors that result from this process are unintentional and may not be corrected upon review.

## 2015-10-09 NOTE — Op Note (Signed)
[facility] Patient Name: Amanda Hale Procedure Date: 10/09/2015 2:28 PM MRN: [id number] Date of Birth: 05-Mar-1926 Attending MD: Amanda Hale , MD CSN: [id number] Age: 82 Admit Type: Outpatient Procedure:                Upper GI endoscopy - diagnostic. Indications:              Heartburn / Atypical chest pain Providers:                Amanda Richards, MD, Amanda Fudge, RN, Amanda Hale, Technician Referring MD:              Medicines:                Midazolam 4 mg IV, Meperidine 50 mg IV, Ondansetron                            4 mg IV Complications:            No immediate complications. Estimated Blood Loss:     Estimated blood loss was minimal. Procedure:                Pre-Anesthesia Assessment:                           - Prior to the procedure, a History and Physical                            was performed, and patient medications and                            allergies were reviewed. The patient's tolerance of                            previous anesthesia was also reviewed. The risks                            and benefits of the procedure and the sedation                            options and risks were discussed with the patient.                            All questions were answered, and informed consent                            was obtained. Prior Anticoagulants: The patient has                            taken no previous anticoagulant or antiplatelet                            agents. ASA Grade Assessment: II - A patient with  mild systemic disease. After reviewing the risks                            and benefits, the patient was deemed in                            satisfactory condition to undergo the procedure.                           After obtaining informed consent, the endoscope was                            passed under direct vision. Throughout the   procedure, the patient's blood pressure, pulse, and                            oxygen saturations were monitored continuously. The                            [id number] scope was introduced through the                            mouth, and advanced to the second part of duodenum.                            The upper GI endoscopy was accomplished without                            difficulty. The patient tolerated the procedure                            well. Scope In: 2:38:43 PM Scope Out: 2:43:22 PM Total Procedure Duration: 0 hours 4 minutes 39 seconds  Findings:      The examined esophagus was normal.      A benign-appearing, intrinsic mild stenosis was found at the pylorus.       Otherwise, gastric mucosa appeared normal.This was traversed with some       resistance.. Estimated blood loss was minimal.      The second portion of the duodenum was normal. Impression:               - Normal esophagus.                           - Gastric(pylorus) stenosis was found at the                            pylorus. Dilated withpe passage                           - Normal second portion of the duodenum.                           - No specimens collected. Reflux symptoms  reportedly inadequately controlled on Protonix Moderate Sedation:      Moderate (conscious) sedation was administered by the endoscopy nurse       and supervised by the endoscopist. The following parameters were       monitored: oxygen saturation, heart rate, blood pressure, respiratory       rate, EKG, adequacy of pulmonary ventilation, and response to care.       Total physician intraservice time was 14 minutes. Recommendation:           - Patient has a contact number available for                            emergencies. The signs and symptoms of potential                            delayed complications were discussed with the                            patient. Return to normal activities  tomorrow.                            Written discharge instructions were provided to the                            patient.                           - Advance diet as tolerated.                           - Continue present medications. Stop Protonix;                            begin Dexilant 60 mg daily                           - Return to GI office in 4 weeks. Procedure Code(s):        --- Professional ---                           [phone], Esophagogastroduodenoscopy, flexible,                            transoral; diagnostic, including collection of                            specimen(s) by brushing or washing, when performed                            (separate procedure)                           99152, Moderate sedation services provided by the                            same physician or other qualified health care  professional performing the diagnostic or                            therapeutic service that the sedation supports,                            requiring the presence of an independent trained                            observer to assist in the monitoring of the                            patient's level of consciousness and physiological                            status; initial 15 minutes of intraservice time,                            patient age 5 years or older Diagnosis Code(s):        --- Professional ---                           K31.1, Adult hypertrophic pyloric stenosis                           R12, Heartburn CPT copyright 2016 [employer]. All rights reserved. The codes documented in this report are preliminary and upon coder review may  be revised to meet current compliance requirements. Amanda Hale. [name], MD Amanda Richards, MD 10/09/2015 2:59:11 PM This report has been signed electronically. Number of Addenda: 0

## 2015-10-09 NOTE — Interval H&P Note (Signed)
History and Physical Interval Note:  10/09/2015 2:26 PM  Amanda Hale  has presented today for surgery, with the diagnosis of GERD  The various methods of treatment have been discussed with the patient and family. After consideration of risks, benefits and other options for treatment, the patient has consented to  Procedure(s) with comments: ESOPHAGOGASTRODUODENOSCOPY (EGD) (N/A) - 245 as a surgical intervention .  The patient's history has been reviewed, patient examined, no change in status, stable for surgery.  I have reviewed the patient's chart and labs.  Questions were answered to the patient's satisfaction.     Amanda Hale  CBC and sedimentation rate better; otherwise, no change. Diagnostic EGD per plan.  The risks, benefits, limitations, alternatives and imponderables have been reviewed with the patient. Potential for esophageal dilation, biopsy, etc. have also been reviewed.  Questions have been answered. All parties agreeable.

## 2015-10-09 NOTE — Discharge Instructions (Signed)
EGD Discharge instructions Please read the instructions outlined below and refer to this sheet in the next few weeks. These discharge instructions provide you with general information on caring for yourself after you leave the hospital. Your doctor may also give you specific instructions. While your treatment has been planned according to the most current medical practices available, unavoidable complications occasionally occur. If you have any problems or questions after discharge, please call your doctor. ACTIVITY  You may resume your regular activity but move at a slower pace for the next 24 hours.   Take frequent rest periods for the next 24 hours.   Walking will help expel (get rid of) the air and reduce the bloated feeling in your abdomen.   No driving for 24 hours (because of the anesthesia (medicine) used during the test).   You may shower.   Do not sign any important legal documents or operate any machinery for 24 hours (because of the anesthesia used during the test).  NUTRITION  Drink plenty of fluids.   You may resume your normal diet.   Begin with a light meal and progress to your normal diet.   Avoid alcoholic beverages for 24 hours or as instructed by your caregiver.  MEDICATIONS  You may resume your normal medications unless your caregiver tells you otherwise.  WHAT YOU CAN EXPECT TODAY  You may experience abdominal discomfort such as a feeling of fullness or gas pains.  FOLLOW-UP  Your doctor will discuss the results of your test with you.  SEEK IMMEDIATE MEDICAL ATTENTION IF ANY OF THE FOLLOWING OCCUR:  Excessive nausea (feeling sick to your stomach) and/or vomiting.   Severe abdominal pain and distention (swelling).   Trouble swallowing.   Temperature over 101 F (37.8 C).   Rectal bleeding or vomiting of blood.    GERD information provided  Stop Protonix for now;  Trial of Dexilant 60 mg daily-go by my office for free samples waiting for  you  Office visit with [place] in one month      Gastroesophageal Reflux Disease, Adult Normally, food travels down the esophagus and stays in the stomach to be digested. However, when a person has gastroesophageal reflux disease (GERD), food and stomach acid move back up into the esophagus. When this happens, the esophagus becomes sore and inflamed. Over time, GERD can create small holes (ulcers) in the lining of the esophagus.  CAUSES This condition is caused by a problem with the muscle between the esophagus and the stomach (lower esophageal sphincter, or LES). Normally, the LES muscle closes after food passes through the esophagus to the stomach. When the LES is weakened or abnormal, it does not close properly, and that allows food and stomach acid to go back up into the esophagus. The LES can be weakened by certain dietary substances, medicines, and medical conditions, including:  Tobacco use.  Pregnancy.  Having a hiatal hernia.  Heavy alcohol use.  Certain foods and beverages, such as coffee, chocolate, onions, and peppermint. RISK FACTORS This condition is more likely to develop in:  People who have an increased body weight.  People who have connective tissue disorders.  People who use NSAID medicines. SYMPTOMS Symptoms of this condition include:  Heartburn.  Difficult or painful swallowing.  The feeling of having a lump in the throat.  Abitter taste in the mouth.  Bad breath.  Having a large amount of saliva.  Having an upset or bloated stomach.  Belching.  Chest pain.  Shortness of breath  or wheezing.  Ongoing (chronic) cough or a night-time cough.  Wearing away of tooth enamel.  Weight loss. Different conditions can cause chest pain. Make sure to see your health care provider if you experience chest pain. DIAGNOSIS Your health care provider will take a medical history and perform a physical exam. To determine if you have mild or severe GERD, your  health care provider may also monitor how you respond to treatment. You may also have other tests, including:  An endoscopy toexamine your stomach and esophagus with a small camera.  A test thatmeasures the acidity level in your esophagus.  A test thatmeasures how much pressure is on your esophagus.  A barium swallow or modified barium swallow to show the shape, size, and functioning of your esophagus. TREATMENT The goal of treatment is to help relieve your symptoms and to prevent complications. Treatment for this condition may vary depending on how severe your symptoms are. Your health care provider may recommend:  Changes to your diet.  Medicine.  Surgery. HOME CARE INSTRUCTIONS Diet  Follow a diet as recommended by your health care provider. This may involve avoiding foods and drinks such as:  Coffee and tea (with or without caffeine).  Drinks that containalcohol.  Energy drinks and sports drinks.  Carbonated drinks or sodas.  Chocolate and cocoa.  Peppermint and mint flavorings.  Garlic and onions.  Horseradish.  Spicy and acidic foods, including peppers, chili powder, curry powder, vinegar, hot sauces, and barbecue sauce.  Citrus fruit juices and citrus fruits, such as oranges, lemons, and limes.  Tomato-based foods, such as red sauce, chili, salsa, and pizza with red sauce.  Fried and fatty foods, such as donuts, french fries, potato chips, and high-fat dressings.  High-fat meats, such as hot dogs and fatty cuts of red and white meats, such as rib eye steak, sausage, ham, and bacon.  High-fat dairy items, such as whole milk, butter, and cream cheese.  Eat small, frequent meals instead of large meals.  Avoid drinking large amounts of liquid with your meals.  Avoid eating meals during the 2-3 hours before bedtime.  Avoid lying down right after you eat.  Do not exercise right after you eat. General Instructions  Pay attention to any changes in  your symptoms.  Take over-the-counter and prescription medicines only as told by your health care provider. Do not take aspirin, ibuprofen, or other NSAIDs unless your health care provider told you to do so.  Do not use any tobacco products, including cigarettes, chewing tobacco, and e-cigarettes. If you need help quitting, ask your health care provider.  Wear loose-fitting clothing. Do not wear anything tight around your waist that causes pressure on your abdomen.  Raise (elevate) the head of your bed 6 inches (15cm).  Try to reduce your stress, such as with yoga or meditation. If you need help reducing stress, ask your health care provider.  If you are overweight, reduce your weight to an amount that is healthy for you. Ask your health care provider for guidance about a safe weight loss goal.  Keep all follow-up visits as told by your health care provider. This is important. SEEK MEDICAL CARE IF:  You have new symptoms.  You have unexplained weight loss.  You have difficulty swallowing, or it hurts to swallow.  You have wheezing or a persistent cough.  Your symptoms do not improve with treatment.  You have a hoarse voice. SEEK IMMEDIATE MEDICAL CARE IF:  You have pain in your arms,  neck, jaw, teeth, or back.  You feel sweaty, dizzy, or light-headed.  You have chest pain or shortness of breath.  You vomit and your vomit looks like blood or coffee grounds.  You faint.  Your stool is bloody or black.  You cannot swallow, drink, or eat.   This information is not intended to replace advice given to you by your health care provider. Make sure you discuss any questions you have with your health care provider.   Document Released: 02/11/2005 Document Revised: 01/23/2015 Document Reviewed: 08/29/2014 Elsevier Interactive Patient Education [employer].

## 2015-10-11 ENCOUNTER — Encounter (HOSPITAL_COMMUNITY): Payer: Self-pay | Admitting: Internal Medicine

## 2015-11-06 ENCOUNTER — Encounter: Payer: Self-pay | Admitting: Gastroenterology

## 2015-11-06 ENCOUNTER — Ambulatory Visit (INDEPENDENT_AMBULATORY_CARE_PROVIDER_SITE_OTHER): Payer: Medicare Other | Admitting: Gastroenterology

## 2015-11-06 VITALS — BP 118/66 | HR 87 | Temp 97.2°F | Ht 59.0 in | Wt 140.0 lb

## 2015-11-06 DIAGNOSIS — K311 Adult hypertrophic pyloric stenosis: Secondary | ICD-10-CM | POA: Diagnosis not present

## 2015-11-06 DIAGNOSIS — K219 Gastro-esophageal reflux disease without esophagitis: Secondary | ICD-10-CM | POA: Diagnosis not present

## 2015-11-06 MED ORDER — DEXLANSOPRAZOLE 60 MG PO CPDR: 60.0000 mg | DELAYED_RELEASE_CAPSULE | Freq: Every day | ORAL | Status: DC

## 2015-11-06 NOTE — Assessment & Plan Note (Signed)
Symptoms improved. We'll switch back to Dexilant, samples and prescription provided. Seem to do somewhat better than pantoprazole. Reinforced anti-reflex measures. Continue current management of constipation. Patient and/or daughter will let me know if she has ongoing symptoms. Office visit when necessary.

## 2015-11-06 NOTE — Patient Instructions (Signed)
1. Stop pantoprazole. Start Dexilant once daily before breakfast. Samples provided. RX sent to pharmacy. 2. Call if ongoing symptoms.

## 2015-11-06 NOTE — Progress Notes (Signed)
      Primary Care Physician: Wende Neighbors, MD  Primary Gastroenterologist:  Garfield Cornea, MD   Chief Complaint  Patient presents with  . Follow-up    HPI: Amanda Hale is a 82 y.o. female hereFor follow-up of recent EGD for GERD/atypical chest pain. Cardiac evaluation was negative. Seen by Dr. Gala Romney back in May. Recent EGD showed pyloric stenosis, status post dilation. Protonix had helped symptoms some but not totally alleviate. After EGD she was given samples of Dexilant. She thinks they may have been somewhat better than the pantoprazole. She would like to try for a longer period of time. Denies dysphagia. No vomiting. Constipation well-managed with stool softeners and occasional over-the-counter laxatives. Weight has been stable  Current Outpatient Prescriptions  Medication Sig Dispense Refill  . ALPRAZolam (XANAX) 0.5 MG tablet Take 0.5 mg by mouth 3 (three) times daily.    [name] Sodium 30-100 MG CAPS Take 1 capsule by mouth as needed.    . citalopram (CELEXA) 20 MG tablet Take 20 mg by mouth daily.    Marland Kitchen donepezil (ARICEPT) 5 MG tablet Take 5 mg by mouth at bedtime.    Marland Kitchen glyBURIDE (DIABETA) 5 MG tablet Take 2.5 mg by mouth daily with breakfast.    . losartan-hydrochlorothiazide (HYZAAR) 100-12.5 MG per tablet Take 1 tablet by mouth daily.    . meclizine (ANTIVERT) 25 MG tablet Take 25 mg by mouth 4 (four) times daily as needed.    . metFORMIN (GLUCOPHAGE-XR) 500 MG 24 hr tablet Take 500 mg by mouth 2 (two) times daily.    . pantoprazole (PROTONIX) 40 MG tablet Reported on 11/06/2015    . traMADol (ULTRAM) 50 MG tablet Take 50 mg by mouth every 6 (six) hours as needed for pain.     No current facility-administered medications for this visit.    Allergies as of 11/06/2015  . (No Known Allergies)    ROS:  General: Negative for anorexia, weight loss, fever, chills, fatigue, weakness. ENT: Negative for hoarseness, difficulty swallowing , nasal congestion. CV:  Negative for chest pain, angina, palpitations, dyspnea on exertion, peripheral edema.  Respiratory: Negative for dyspnea at rest, dyspnea on exertion, cough, sputum, wheezing.  GI: See history of present illness. GU:  Negative for dysuria, hematuria, urinary incontinence, urinary frequency, nocturnal urination.  Endo: Negative for unusual weight change.    Physical Examination:   BP 118/66 mmHg  Pulse 87  Temp(Src) 97.2 F (36.2 C)  Ht 4\' 11"  (1.499 m)  Wt 140 lb (63.504 kg)  BMI 28.26 kg/m2  General: Well-nourished, well-developed in no acute distress.  Eyes: No icterus. Mouth: Oropharyngeal mucosa moist and pink , no lesions erythema or exudate. Lungs: Clear to auscultation bilaterally.  Heart: Regular rate and rhythm, no murmurs rubs or gallops.  Abdomen: Bowel sounds are normal, nontender, nondistended, no hepatosplenomegaly or masses, no abdominal bruits or hernia , no rebound or guarding.   Extremities: No lower extremity edema. No clubbing or deformities. Neuro: Alert and oriented x 4   Skin: Warm and dry, no jaundice.   Psych: Alert and cooperative, normal mood and affect.  Labs:  Lab Results  Component Value Date   WBC 7.4 10/08/2015   HGB 12.1 10/08/2015   HCT 38.8 10/08/2015   MCV 90.7 10/08/2015   PLT 354 10/08/2015      Imaging Studies: No results found.

## 2015-11-06 NOTE — Progress Notes (Signed)
cc'ed to pcp °

## 2015-11-11 DIAGNOSIS — E782 Mixed hyperlipidemia: Secondary | ICD-10-CM | POA: Diagnosis not present

## 2015-11-11 DIAGNOSIS — E1122 Type 2 diabetes mellitus with diabetic chronic kidney disease: Secondary | ICD-10-CM | POA: Diagnosis not present

## 2015-11-12 DIAGNOSIS — E782 Mixed hyperlipidemia: Secondary | ICD-10-CM | POA: Diagnosis not present

## 2015-11-12 DIAGNOSIS — F0390 Unspecified dementia without behavioral disturbance: Secondary | ICD-10-CM | POA: Diagnosis not present

## 2015-11-12 DIAGNOSIS — E1122 Type 2 diabetes mellitus with diabetic chronic kidney disease: Secondary | ICD-10-CM | POA: Diagnosis not present

## 2015-11-12 DIAGNOSIS — F411 Generalized anxiety disorder: Secondary | ICD-10-CM | POA: Diagnosis not present

## 2015-11-12 DIAGNOSIS — I10 Essential (primary) hypertension: Secondary | ICD-10-CM | POA: Diagnosis not present

## 2015-11-12 DIAGNOSIS — G894 Chronic pain syndrome: Secondary | ICD-10-CM | POA: Diagnosis not present

## 2015-12-03 DIAGNOSIS — L110 Acquired keratosis follicularis: Secondary | ICD-10-CM | POA: Diagnosis not present

## 2015-12-03 DIAGNOSIS — E1151 Type 2 diabetes mellitus with diabetic peripheral angiopathy without gangrene: Secondary | ICD-10-CM | POA: Diagnosis not present

## 2015-12-03 DIAGNOSIS — L609 Nail disorder, unspecified: Secondary | ICD-10-CM | POA: Diagnosis not present

## 2015-12-06 ENCOUNTER — Other Ambulatory Visit (HOSPITAL_COMMUNITY): Payer: Self-pay | Admitting: Internal Medicine

## 2015-12-06 DIAGNOSIS — Z1231 Encounter for screening mammogram for malignant neoplasm of breast: Secondary | ICD-10-CM

## 2016-01-09 ENCOUNTER — Ambulatory Visit (HOSPITAL_COMMUNITY): Payer: Medicare Other

## 2016-01-15 ENCOUNTER — Ambulatory Visit (HOSPITAL_COMMUNITY): Payer: Medicare Other

## 2016-01-15 ENCOUNTER — Other Ambulatory Visit: Payer: Self-pay

## 2016-01-23 ENCOUNTER — Ambulatory Visit (HOSPITAL_COMMUNITY)
Admission: RE | Admit: 2016-01-23 | Discharge: 2016-01-23 | Disposition: A | Discharge status: Home / Self Care | Payer: Medicare Other | Source: Ambulatory Visit | Attending: Internal Medicine | Admitting: Internal Medicine

## 2016-01-23 DIAGNOSIS — Z1231 Encounter for screening mammogram for malignant neoplasm of breast: Secondary | ICD-10-CM | POA: Diagnosis not present

## 2016-03-03 DIAGNOSIS — E1151 Type 2 diabetes mellitus with diabetic peripheral angiopathy without gangrene: Secondary | ICD-10-CM | POA: Diagnosis not present

## 2016-03-03 DIAGNOSIS — L110 Acquired keratosis follicularis: Secondary | ICD-10-CM | POA: Diagnosis not present

## 2016-03-03 DIAGNOSIS — L609 Nail disorder, unspecified: Secondary | ICD-10-CM | POA: Diagnosis not present

## 2016-05-01 DIAGNOSIS — I10 Essential (primary) hypertension: Secondary | ICD-10-CM | POA: Diagnosis not present

## 2016-05-01 DIAGNOSIS — E782 Mixed hyperlipidemia: Secondary | ICD-10-CM | POA: Diagnosis not present

## 2016-05-01 DIAGNOSIS — E1122 Type 2 diabetes mellitus with diabetic chronic kidney disease: Secondary | ICD-10-CM | POA: Diagnosis not present

## 2016-05-04 DIAGNOSIS — Z0001 Encounter for general adult medical examination with abnormal findings: Secondary | ICD-10-CM | POA: Diagnosis not present

## 2016-05-04 DIAGNOSIS — N182 Chronic kidney disease, stage 2 (mild): Secondary | ICD-10-CM | POA: Diagnosis not present

## 2016-05-04 DIAGNOSIS — G894 Chronic pain syndrome: Secondary | ICD-10-CM | POA: Diagnosis not present

## 2016-05-04 DIAGNOSIS — F0390 Unspecified dementia without behavioral disturbance: Secondary | ICD-10-CM | POA: Diagnosis not present

## 2016-05-04 DIAGNOSIS — I10 Essential (primary) hypertension: Secondary | ICD-10-CM | POA: Diagnosis not present

## 2016-05-04 DIAGNOSIS — E1122 Type 2 diabetes mellitus with diabetic chronic kidney disease: Secondary | ICD-10-CM | POA: Diagnosis not present

## 2016-05-04 DIAGNOSIS — F411 Generalized anxiety disorder: Secondary | ICD-10-CM | POA: Diagnosis not present

## 2016-05-04 DIAGNOSIS — Z6825 Body mass index (BMI) 25.0-25.9, adult: Secondary | ICD-10-CM | POA: Diagnosis not present

## 2016-05-04 DIAGNOSIS — E782 Mixed hyperlipidemia: Secondary | ICD-10-CM | POA: Diagnosis not present

## 2016-06-02 DIAGNOSIS — L110 Acquired keratosis follicularis: Secondary | ICD-10-CM | POA: Diagnosis not present

## 2016-06-02 DIAGNOSIS — E1151 Type 2 diabetes mellitus with diabetic peripheral angiopathy without gangrene: Secondary | ICD-10-CM | POA: Diagnosis not present

## 2016-06-02 DIAGNOSIS — L609 Nail disorder, unspecified: Secondary | ICD-10-CM | POA: Diagnosis not present

## 2016-07-03 DIAGNOSIS — E559 Vitamin D deficiency, unspecified: Secondary | ICD-10-CM | POA: Diagnosis not present

## 2016-07-03 DIAGNOSIS — R5383 Other fatigue: Secondary | ICD-10-CM | POA: Diagnosis not present

## 2016-07-03 DIAGNOSIS — R531 Weakness: Secondary | ICD-10-CM | POA: Diagnosis not present

## 2016-07-03 DIAGNOSIS — R42 Dizziness and giddiness: Secondary | ICD-10-CM | POA: Diagnosis not present

## 2016-07-03 DIAGNOSIS — D519 Vitamin B12 deficiency anemia, unspecified: Secondary | ICD-10-CM | POA: Diagnosis not present

## 2016-07-03 DIAGNOSIS — R944 Abnormal results of kidney function studies: Secondary | ICD-10-CM | POA: Diagnosis not present

## 2016-08-18 DIAGNOSIS — E1140 Type 2 diabetes mellitus with diabetic neuropathy, unspecified: Secondary | ICD-10-CM | POA: Diagnosis not present

## 2016-08-18 DIAGNOSIS — E1151 Type 2 diabetes mellitus with diabetic peripheral angiopathy without gangrene: Secondary | ICD-10-CM | POA: Diagnosis not present

## 2016-08-18 DIAGNOSIS — L609 Nail disorder, unspecified: Secondary | ICD-10-CM | POA: Diagnosis not present

## 2016-08-18 DIAGNOSIS — L110 Acquired keratosis follicularis: Secondary | ICD-10-CM | POA: Diagnosis not present

## 2016-08-19 DIAGNOSIS — H5212 Myopia, left eye: Secondary | ICD-10-CM | POA: Diagnosis not present

## 2016-08-19 DIAGNOSIS — H524 Presbyopia: Secondary | ICD-10-CM | POA: Diagnosis not present

## 2016-08-19 DIAGNOSIS — H353132 Nonexudative age-related macular degeneration, bilateral, intermediate dry stage: Secondary | ICD-10-CM | POA: Diagnosis not present

## 2016-08-19 DIAGNOSIS — E119 Type 2 diabetes mellitus without complications: Secondary | ICD-10-CM | POA: Diagnosis not present

## 2016-08-19 DIAGNOSIS — Z961 Presence of intraocular lens: Secondary | ICD-10-CM | POA: Diagnosis not present

## 2016-08-19 DIAGNOSIS — H52203 Unspecified astigmatism, bilateral: Secondary | ICD-10-CM | POA: Diagnosis not present

## 2016-08-20 ENCOUNTER — Other Ambulatory Visit: Payer: Self-pay | Admitting: Internal Medicine

## 2016-08-20 ENCOUNTER — Ambulatory Visit (HOSPITAL_COMMUNITY)
Admission: RE | Admit: 2016-08-20 | Discharge: 2016-08-20 | Disposition: A | Discharge status: Home / Self Care | Payer: Medicare Other | Source: Ambulatory Visit | Attending: Internal Medicine | Admitting: Internal Medicine

## 2016-08-20 DIAGNOSIS — M25512 Pain in left shoulder: Secondary | ICD-10-CM | POA: Diagnosis not present

## 2016-08-20 DIAGNOSIS — M7582 Other shoulder lesions, left shoulder: Secondary | ICD-10-CM | POA: Insufficient documentation

## 2016-08-20 DIAGNOSIS — M25511 Pain in right shoulder: Secondary | ICD-10-CM | POA: Diagnosis not present

## 2016-08-20 DIAGNOSIS — M8580 Other specified disorders of bone density and structure, unspecified site: Secondary | ICD-10-CM | POA: Insufficient documentation

## 2016-08-20 DIAGNOSIS — Z6825 Body mass index (BMI) 25.0-25.9, adult: Secondary | ICD-10-CM | POA: Diagnosis not present

## 2016-08-20 DIAGNOSIS — S4992XA Unspecified injury of left shoulder and upper arm, initial encounter: Secondary | ICD-10-CM | POA: Diagnosis not present

## 2016-10-30 DIAGNOSIS — E559 Vitamin D deficiency, unspecified: Secondary | ICD-10-CM | POA: Diagnosis not present

## 2016-10-30 DIAGNOSIS — D519 Vitamin B12 deficiency anemia, unspecified: Secondary | ICD-10-CM | POA: Diagnosis not present

## 2016-10-30 DIAGNOSIS — E1122 Type 2 diabetes mellitus with diabetic chronic kidney disease: Secondary | ICD-10-CM | POA: Diagnosis not present

## 2016-11-02 DIAGNOSIS — E1151 Type 2 diabetes mellitus with diabetic peripheral angiopathy without gangrene: Secondary | ICD-10-CM | POA: Diagnosis not present

## 2016-11-02 DIAGNOSIS — L110 Acquired keratosis follicularis: Secondary | ICD-10-CM | POA: Diagnosis not present

## 2016-11-02 DIAGNOSIS — L609 Nail disorder, unspecified: Secondary | ICD-10-CM | POA: Diagnosis not present

## 2016-11-03 DIAGNOSIS — H3530 Unspecified macular degeneration: Secondary | ICD-10-CM | POA: Diagnosis not present

## 2016-11-03 DIAGNOSIS — F411 Generalized anxiety disorder: Secondary | ICD-10-CM | POA: Diagnosis not present

## 2016-11-03 DIAGNOSIS — F0390 Unspecified dementia without behavioral disturbance: Secondary | ICD-10-CM | POA: Diagnosis not present

## 2016-11-03 DIAGNOSIS — G894 Chronic pain syndrome: Secondary | ICD-10-CM | POA: Diagnosis not present

## 2016-11-03 DIAGNOSIS — M25569 Pain in unspecified knee: Secondary | ICD-10-CM | POA: Diagnosis not present

## 2016-11-03 DIAGNOSIS — E1122 Type 2 diabetes mellitus with diabetic chronic kidney disease: Secondary | ICD-10-CM | POA: Diagnosis not present

## 2016-11-03 DIAGNOSIS — E782 Mixed hyperlipidemia: Secondary | ICD-10-CM | POA: Diagnosis not present

## 2016-11-03 DIAGNOSIS — I129 Hypertensive chronic kidney disease with stage 1 through stage 4 chronic kidney disease, or unspecified chronic kidney disease: Secondary | ICD-10-CM | POA: Diagnosis not present

## 2016-11-25 ENCOUNTER — Telehealth: Payer: Self-pay

## 2016-11-25 NOTE — Telephone Encounter (Signed)
Pt is calling to see if she needs to keep taking the [place]. If so she will need a refill. Please advise

## 2016-11-26 ENCOUNTER — Ambulatory Visit (INDEPENDENT_AMBULATORY_CARE_PROVIDER_SITE_OTHER): Payer: Medicare Other | Admitting: Orthopaedic Surgery

## 2016-11-26 ENCOUNTER — Ambulatory Visit (INDEPENDENT_AMBULATORY_CARE_PROVIDER_SITE_OTHER): Payer: Medicare Other

## 2016-11-26 ENCOUNTER — Encounter: Payer: Self-pay | Admitting: Orthopaedic Surgery

## 2016-11-26 ENCOUNTER — Ambulatory Visit: Payer: Medicare Other

## 2016-11-26 VITALS — BP 152/72 | HR 87 | Temp 97.2°F | Ht 59.0 in | Wt 131.0 lb

## 2016-11-26 DIAGNOSIS — M25561 Pain in right knee: Secondary | ICD-10-CM

## 2016-11-26 DIAGNOSIS — M25562 Pain in left knee: Secondary | ICD-10-CM | POA: Diagnosis not present

## 2016-11-26 DIAGNOSIS — G8929 Other chronic pain: Secondary | ICD-10-CM

## 2016-11-26 MED ORDER — DEXLANSOPRAZOLE 60 MG PO CPDR: 60.0000 mg | DELAYED_RELEASE_CAPSULE | Freq: Every day | ORAL | 0 refills | Status: DC

## 2016-11-26 NOTE — Telephone Encounter (Signed)
rx done.  Would advise nonurgent ov within 3 months with rmr.

## 2016-11-26 NOTE — Progress Notes (Signed)
Subjective:    Patient ID: Amanda Hale, female    DOB: 1926-05-03, 83 y.o.   MRN: [id number]  HPI She has had bilateral knee pain for over 11 years.  She has popping, swelling, and some giving way.  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  She has problems with her balance.  She has to use a walker.  The knees hurt with weight bearing and now some at rest.  She has no trauma, no redness, no locking.  She has tried all sorts of medicine, rubs, heat and ice with little help.  She has recently changed primary care doctor to [facility]   Review of Systems  HENT: Negative for congestion.   Respiratory: Negative for cough and shortness of breath.   Cardiovascular: Negative for chest pain and leg swelling.  Endocrine: Positive for cold intolerance.  Musculoskeletal: Positive for arthralgias, gait problem and joint swelling.  Allergic/Immunologic: Positive for environmental allergies.   Past Medical History:  Diagnosis Date  . Arthritis   . DM type 2 (diabetes mellitus, type 2) ([place])   . Dyslipidemia   . Hiatal hernia 2014  . HTN (hypertension)   . Osteoporosis   . Reflux esophagitis 2014   erosive  . Schatzki's ring 2014  . Tubular adenoma     Past Surgical History:  Procedure Laterality Date  . ABDOMINAL HYSTERECTOMY    . APPENDECTOMY    . BREAST BIOPSY    . COLONOSCOPY N/A 09/08/2012   RMR: tubular adenoma, poor prep, needs surveillance April 2015   . COLONOSCOPY  1990/1992/1996   [facility], multiple hyperplastic polyps  . ESOPHAGOGASTRODUODENOSCOPY N/A 10/09/2015   RMR: pyloric stenosis dilated with scope.  . ESOPHAGOGASTRODUODENOSCOPY (EGD) WITH ESOPHAGEAL DILATION N/A 09/08/2012   RMR: Schatzki's ring s/p 54- F dilation, erosive esophagitis, hiatal hernia, gastric/bulb erosions with mild pyloric stenosis. Negative path  . EYE SURGERY     both  . HIP SURGERY     right x 2  . RECTAL SURGERY     prolapsed  rectum/hemorrhoids    Current Outpatient Prescriptions on File Prior to Visit  Medication Sig Dispense Refill  . ALPRAZolam (XANAX) 0.5 MG tablet Take 0.5 mg by mouth 3 (three) times daily.    Marland Kitchen donepezil (ARICEPT) 5 MG tablet Take 5 mg by mouth at bedtime.    Marland Kitchen losartan-hydrochlorothiazide (HYZAAR) 100-12.5 MG per tablet Take 1 tablet by mouth daily.    . metFORMIN (GLUCOPHAGE-XR) 500 MG 24 hr tablet Take 500 mg by mouth 2 (two) times daily.    . traMADol (ULTRAM) 50 MG tablet Take 50 mg by mouth every 6 (six) hours as needed for pain.    [name] Sodium 30-100 MG CAPS Take 1 capsule by mouth as needed.    . citalopram (CELEXA) 20 MG tablet Take 20 mg by mouth daily.    Marland Kitchen dexlansoprazole (DEXILANT) 60 MG capsule Take 1 capsule (60 mg total) by mouth daily. (Patient not taking: Reported on 11/26/2016) 90 capsule 0  . glyBURIDE (DIABETA) 5 MG tablet Take 2.5 mg by mouth daily with breakfast.    . meclizine (ANTIVERT) 25 MG tablet Take 25 mg by mouth 4 (four) times daily as needed.     No current facility-administered medications on file prior to visit.     Social History   Social History  . Marital status: Divorced    Spouse name: N/A  . Number of  children: N/A  . Years of education: N/A   Occupational History  . Not on file.   Social History Main Topics  . Smoking status: Never Smoker  . Smokeless tobacco: Not on file  . Alcohol use No  . Drug use: No  . Sexual activity: Not on file   Other Topics Concern  . Not on file   Social History Narrative  . No narrative on file    Family History  Problem Relation Age of Onset  . Hypertension Mother   . Hypertension Son   . Colon cancer Neg Hx     BP (!) 152/72   Pulse 87   Temp (!) 97.2 F (36.2 C)   Ht 4\' 11"  (1.499 m)   Wt 131 lb (59.4 kg)   BMI 26.46 kg/m      Objective:   Physical Exam  Constitutional: She is oriented to person, place, and time. She appears well-developed and well-nourished.    HENT:  Head: Normocephalic and atraumatic.  Eyes: Pupils are equal, round, and reactive to light. Conjunctivae and EOM are normal.  Neck: Normal range of motion. Neck supple.  Cardiovascular: Normal rate, regular rhythm and intact distal pulses.   Pulmonary/Chest: Effort normal.  Abdominal: Soft.  Musculoskeletal: She exhibits tenderness (Both knees are tender, the right more than left.  ROM 0 to 100 right, 0-105 left, crepitus bilaterally, NV intact.  Knees stable.  Uses a walker.  More limp on the right.).  Neurological: She is alert and oriented to person, place, and time. She displays normal reflexes. No cranial nerve deficit. She exhibits normal muscle tone. Coordination normal.  Skin: Skin is warm and dry.  Psychiatric: She has a normal mood and affect. Her behavior is normal. Judgment and thought content normal.  Vitals reviewed.  X-rays of both knees were done, reported separately.       Assessment & Plan:   Encounter Diagnosis  Name Primary?  . Chronic pain of both knees Yes   PROCEDURE NOTE:  The patient requests injections of the left knee , verbal consent was obtained.  The left knee was prepped appropriately after time out was performed.   Sterile technique was observed and injection of 1 cc of Depo-Medrol 40 mg with several cc's of plain xylocaine. Anesthesia was provided by ethyl chloride and a 20-gauge needle was used to inject the knee area. The injection was tolerated well.  A band aid dressing was applied.  The patient was advised to apply ice later today and tomorrow to the injection sight as needed.  PROCEDURE NOTE:  The patient requests injections of the right knee , verbal consent was obtained.  The right knee was prepped appropriately after time out was performed.   Sterile technique was observed and injection of 1 cc of Depo-Medrol 40 mg with several cc's of plain xylocaine. Anesthesia was provided by ethyl chloride and a 20-gauge needle was used to  inject the knee area. The injection was tolerated well.  A band aid dressing was applied.  The patient was advised to apply ice later today and tomorrow to the injection sight as needed.  Call if any problem.  Precautions discussed.  Electronically Signed Sanjuana Kava, MD 7/12/20183:11 PM

## 2016-11-26 NOTE — Telephone Encounter (Signed)
REMINDER IN EPIC °

## 2016-11-26 NOTE — Telephone Encounter (Signed)
Pt is aware/ Please make an appointment with RMR

## 2016-12-29 ENCOUNTER — Encounter: Payer: Self-pay | Admitting: Orthopaedic Surgery

## 2016-12-29 ENCOUNTER — Ambulatory Visit (INDEPENDENT_AMBULATORY_CARE_PROVIDER_SITE_OTHER): Payer: Medicare Other | Admitting: Orthopaedic Surgery

## 2016-12-29 VITALS — BP 139/70 | HR 78 | Ht 59.0 in | Wt 132.0 lb

## 2016-12-29 DIAGNOSIS — M25562 Pain in left knee: Secondary | ICD-10-CM | POA: Diagnosis not present

## 2016-12-29 DIAGNOSIS — G8929 Other chronic pain: Secondary | ICD-10-CM | POA: Diagnosis not present

## 2016-12-29 DIAGNOSIS — M25561 Pain in right knee: Secondary | ICD-10-CM | POA: Diagnosis not present

## 2016-12-29 NOTE — Progress Notes (Signed)
CC: Both of my knees are hurting. I would like an injection in both knees.  The patient has had chronic pain and tenderness of both knees for some time.  Injections help.  There is no locking or giving way of the knee.  There is no new trauma. There is no redness or signs of infections.  The knees have a mild effusion and some crepitus.  There is no redness or signs of recent trauma.  Right knee ROM is 0-100 and left knee ROM is 0-105.  Impression:  Chronic pain of the both knees  Return:  1 month  PROCEDURE NOTE:  The patient requests injections of both knees, verbal consent was obtained.  The left and right knee were individually prepped appropriately after time out was performed.   Sterile technique was observed and injection of 1 cc of Depo-Medrol 40 mg with several cc's of plain xylocaine. Anesthesia was provided by ethyl chloride and a 20-gauge needle was used to inject each knee area. The injections were tolerated well.  A band aid dressing was applied.  The patient was advised to apply ice later today and tomorrow to the injection sight as needed.  Electronically Signed Sanjuana Kava, MD 8/14/20182:54 PM

## 2017-01-25 DIAGNOSIS — E1151 Type 2 diabetes mellitus with diabetic peripheral angiopathy without gangrene: Secondary | ICD-10-CM | POA: Diagnosis not present

## 2017-01-25 DIAGNOSIS — L110 Acquired keratosis follicularis: Secondary | ICD-10-CM | POA: Diagnosis not present

## 2017-01-25 DIAGNOSIS — L609 Nail disorder, unspecified: Secondary | ICD-10-CM | POA: Diagnosis not present

## 2017-01-28 ENCOUNTER — Ambulatory Visit (INDEPENDENT_AMBULATORY_CARE_PROVIDER_SITE_OTHER): Payer: Medicare Other | Admitting: Orthopaedic Surgery

## 2017-01-28 ENCOUNTER — Encounter: Payer: Self-pay | Admitting: Internal Medicine

## 2017-01-28 DIAGNOSIS — M25561 Pain in right knee: Secondary | ICD-10-CM

## 2017-01-28 DIAGNOSIS — G8929 Other chronic pain: Secondary | ICD-10-CM

## 2017-01-28 NOTE — Progress Notes (Signed)
CC:  I have pain of my right knee. I would like an injection.  The patient has chronic pain of the right knee.  There is no recent trauma.  There is no redness.  Injections in the past have helped.  The knee has no redness, has an effusion and crepitus present.  ROM of the right knee is 0-95.  Impression:  Chronic knee pain right  Return: 1 month  PROCEDURE NOTE:  The patient requests injections of the right knee , verbal consent was obtained.  The right knee was prepped appropriately after time out was performed.   Sterile technique was observed and injection of 1 cc of Depo-Medrol 40 mg with several cc's of plain xylocaine. Anesthesia was provided by ethyl chloride and a 20-gauge needle was used to inject the knee area. The injection was tolerated well.  A band aid dressing was applied.  The patient was advised to apply ice later today and tomorrow to the injection sight as needed.  Electronically Signed Sanjuana Kava, MD 9/13/20181:46 PM

## 2017-02-09 ENCOUNTER — Ambulatory Visit (INDEPENDENT_AMBULATORY_CARE_PROVIDER_SITE_OTHER): Payer: Medicare Other | Admitting: Internal Medicine

## 2017-02-09 ENCOUNTER — Encounter: Payer: Self-pay | Admitting: Internal Medicine

## 2017-02-09 VITALS — BP 131/73 | HR 71 | Temp 97.0°F | Ht 59.0 in | Wt 128.0 lb

## 2017-02-09 DIAGNOSIS — R110 Nausea: Secondary | ICD-10-CM

## 2017-02-09 DIAGNOSIS — K219 Gastro-esophageal reflux disease without esophagitis: Secondary | ICD-10-CM | POA: Diagnosis not present

## 2017-02-09 MED ORDER — DEXLANSOPRAZOLE 60 MG PO CPDR: 60.0000 mg | DELAYED_RELEASE_CAPSULE | Freq: Every day | ORAL | 11 refills | Status: DC

## 2017-02-09 NOTE — Progress Notes (Signed)
Primary Care Physician:  Celene Squibb, MD Primary Gastroenterologist:  Dr. Gala Romney  Pre-Procedure History & Physical: HPI:  Amanda Hale is a 83 y.o. female here for evaluation of a intermittent nausea. States she gets intermittently nauseated when she gets dizzy. When she stands up quickly or moves her head from left to right;  she is supposed be taking Dexilant 60 mg daily but came off this medication after her prescription ran out. No dysphagia. History type 2 diabetes mellitus well controlled on diet alone. No change in bowel habits. Denies abdominal pain.  PCP has given her meclizine; she takes it on occasion - it makes her sleepy.  Past Medical History:  Diagnosis Date  . Arthritis   . DM type 2 (diabetes mellitus, type 2) ([place])   . Dyslipidemia   . Hiatal hernia 2014  . HTN (hypertension)   . Osteoporosis   . Reflux esophagitis 2014   erosive  . Schatzki's ring 2014  . Tubular adenoma     Past Surgical History:  Procedure Laterality Date  . ABDOMINAL HYSTERECTOMY    . APPENDECTOMY    . BREAST BIOPSY    . COLONOSCOPY N/A 09/08/2012   RMR: tubular adenoma, poor prep, needs surveillance April 2015   . COLONOSCOPY  1990/1992/1996   [facility], multiple hyperplastic polyps  . ESOPHAGOGASTRODUODENOSCOPY N/A 10/09/2015   RMR: pyloric stenosis dilated with scope.  . ESOPHAGOGASTRODUODENOSCOPY (EGD) WITH ESOPHAGEAL DILATION N/A 09/08/2012   RMR: Schatzki's ring s/p 54- F dilation, erosive esophagitis, hiatal hernia, gastric/bulb erosions with mild pyloric stenosis. Negative path  . EYE SURGERY     both  . HIP SURGERY     right x 2  . RECTAL SURGERY     prolapsed rectum/hemorrhoids    Prior to Admission medications   Medication Sig Start Date End Date Taking? Authorizing Provider  ALPRAZolam Duanne Moron) 0.5 MG tablet Take 0.5 mg by mouth as needed.    Yes [provider]  Casanthranol-Docusate Sodium 30-100 MG CAPS Take 1 capsule by mouth as needed.    Yes [provider]  citalopram (CELEXA) 20 MG tablet Take 20 mg by mouth daily.   Yes [provider]  donepezil (ARICEPT) 5 MG tablet Take 5 mg by mouth at bedtime.   Yes [provider]  losartan-hydrochlorothiazide (HYZAAR) 100-12.5 MG per tablet Take 1 tablet by mouth daily.   Yes [provider]  meclizine (ANTIVERT) 25 MG tablet Take 25 mg by mouth 4 (four) times daily as needed.   Yes [provider]  metFORMIN (GLUCOPHAGE-XR) 500 MG 24 hr tablet Take 500 mg by mouth 2 (two) times daily.   Yes [provider]  traMADol (ULTRAM) 50 MG tablet Take 50 mg by mouth every 6 (six) hours as needed for pain.   Yes [provider]  dexlansoprazole (DEXILANT) 60 MG capsule Take 1 capsule (60 mg total) by mouth daily. Patient not taking: Reported on 02/09/2017 11/26/16   Mahala Menghini, PA-C  glyBURIDE (DIABETA) 5 MG tablet Take 2.5 mg by mouth daily with breakfast.    [provider]    Allergies as of 02/09/2017  . (No Known Allergies)    Family History  Problem Relation Age of Onset  . Hypertension Mother   . Hypertension Son   . Colon cancer Neg Hx     Social History   Social History  . Marital status: Divorced    Spouse name: N/A  . Number of  children: N/A  . Years of education: N/A   Occupational History  . Not on file.   Social History Main Topics  . Smoking status: Never Smoker  . Smokeless tobacco: Never Used  . Alcohol use No  . Drug use: No  . Sexual activity: Not on file   Other Topics Concern  . Not on file   Social History Narrative  . No narrative on file    Review of Systems: See HPI, otherwise negative ROS  Physical Exam: BP 131/73   Pulse 71   Temp (!) 97 F (36.1 C) (Oral)   Ht 4\' 11"  (1.499 m)   Wt 128 lb (58.1 kg)   BMI 25.85 kg/m  General:   Alert,   well-nourished, pleasant and cooperative in NAD Neck:  Supple; no masses or thyromegaly. No significant cervical  adenopathy. Lungs:  Clear throughout to auscultation.   No wheezes, crackles, or rhonchi. No acute distress. Heart:  Regular rate and rhythm; no murmurs, clicks, rubs,  or gallops. Abdomen: Non-distended, normal bowel sounds.  Soft and nontender without appreciable mass or hepatosplenomegaly.  Pulses:  Normal pulses noted. Extremities:  Without clubbing or edema.  Impression:  Pleasant 83 year old lady with long-standing GERD now with intermittent nausea in association with the vertiginous symptoms. She is no longer on acid suppression therapy. She denies other GI symptoms. There are no alarm features. I suspect she has benign positional vertigo. Other entities certainly possible.  I doubt pyloric stenosis is playing a role.  Nausea only comes along with vertigo. She has well-documented GERD. A component of poorly treated GERD may be contributing to nausea as well.  I doubt significant gastroparesis.  Recommendations:   Resume Dexilant 60 mg daily  GERD information  See Dr. Nevada Crane about vertigo (dizziness)  Office visit here in 3 months           Notice: This dictation was prepared with Dragon dictation along with smaller phrase technology. Any transcriptional errors that result from this process are unintentional and may not be corrected upon review.

## 2017-02-09 NOTE — Patient Instructions (Signed)
Resume Dexilant 60 mg daily  GERD information  See Dr. Nevada Crane about vertigo (dizziness)  Office visit here in 3 months

## 2017-03-02 ENCOUNTER — Ambulatory Visit: Payer: Medicare Other | Admitting: Orthopaedic Surgery

## 2017-03-09 ENCOUNTER — Ambulatory Visit: Payer: Medicare Other | Admitting: Internal Medicine

## 2017-04-13 DIAGNOSIS — L609 Nail disorder, unspecified: Secondary | ICD-10-CM | POA: Diagnosis not present

## 2017-04-13 DIAGNOSIS — E1151 Type 2 diabetes mellitus with diabetic peripheral angiopathy without gangrene: Secondary | ICD-10-CM | POA: Diagnosis not present

## 2017-04-13 DIAGNOSIS — L110 Acquired keratosis follicularis: Secondary | ICD-10-CM | POA: Diagnosis not present

## 2017-05-05 ENCOUNTER — Ambulatory Visit: Payer: Medicare Other | Admitting: Gastroenterology

## 2017-05-05 ENCOUNTER — Ambulatory Visit (INDEPENDENT_AMBULATORY_CARE_PROVIDER_SITE_OTHER): Payer: Medicare Other | Admitting: Gastroenterology

## 2017-05-05 ENCOUNTER — Encounter: Payer: Self-pay | Admitting: Gastroenterology

## 2017-05-05 VITALS — BP 155/76 | HR 80 | Temp 97.3°F | Ht 59.0 in | Wt 128.2 lb

## 2017-05-05 DIAGNOSIS — R1033 Periumbilical pain: Secondary | ICD-10-CM

## 2017-05-05 DIAGNOSIS — R112 Nausea with vomiting, unspecified: Secondary | ICD-10-CM | POA: Diagnosis not present

## 2017-05-05 DIAGNOSIS — D519 Vitamin B12 deficiency anemia, unspecified: Secondary | ICD-10-CM | POA: Diagnosis not present

## 2017-05-05 DIAGNOSIS — E559 Vitamin D deficiency, unspecified: Secondary | ICD-10-CM | POA: Diagnosis not present

## 2017-05-05 DIAGNOSIS — R634 Abnormal weight loss: Secondary | ICD-10-CM | POA: Diagnosis not present

## 2017-05-05 DIAGNOSIS — E119 Type 2 diabetes mellitus without complications: Secondary | ICD-10-CM | POA: Diagnosis not present

## 2017-05-05 NOTE — Progress Notes (Signed)
Primary Care Physician: Celene Squibb, MD  Primary Gastroenterologist:  Garfield Cornea, MD   Chief Complaint  Patient presents with  . Abdominal Pain  . Nausea  . Emesis    HPI: Amanda Hale is a 83 y.o. female here follow-up.  Last seen in September.  History of intermittent nausea. /vomiting associated with dizziness and improved with meclizine when she takes it. Limits use do to drowsiness. She tells me she has had dizziness for years and nobody every investigates why. She states she wants her PCP to do have CT of her head and plans to ask for one later this week at her OV with him.   Not feeling well the last six months. Constipation at times. Some lower abdominal discomfort. No melena, brbpr. Trying to eat Boost or Ensure. Appetite not that great. No heartburn or dysphagia.   Weight down to 128, stable since 01/2017. 140 in 10/2015. 190 in 2014.     Current Outpatient Medications  Medication Sig Dispense Refill  . ALPRAZolam (XANAX) 0.5 MG tablet Take 0.5 mg by mouth as needed.     Marland Kitchen amoxicillin (AMOXIL) 500 MG capsule Take 4 capsules by mouth as needed. Takes before going to dentist  0  . Casanthranol-Docusate Sodium 30-100 MG CAPS Take 1 capsule by mouth as needed.    Marland Kitchen dexlansoprazole (DEXILANT) 60 MG capsule Take 1 capsule (60 mg total) by mouth daily. 30 capsule 11  . donepezil (ARICEPT) 5 MG tablet Take 10 mg by mouth at bedtime.     Marland Kitchen losartan-hydrochlorothiazide (HYZAAR) 100-12.5 MG per tablet Take 1 tablet by mouth daily.    . meclizine (ANTIVERT) 25 MG tablet Take 25 mg by mouth 4 (four) times daily as needed.    . metFORMIN (GLUCOPHAGE-XR) 500 MG 24 hr tablet Take 500 mg by mouth 2 (two) times daily.    . Multiple Vitamins-Minerals (PRESERVISION AREDS 2 PO) Take 1 tablet by mouth 2 (two) times daily.    . traMADol (ULTRAM) 50 MG tablet Take 50 mg by mouth every 6 (six) hours as needed for pain.     No current facility-administered medications for this visit.       Allergies as of 05/05/2017  . (No Known Allergies)    ROS:  General: Negative for anorexia, weight loss, fever, chills, fatigue, weakness. ENT: Negative for hoarseness, difficulty swallowing , nasal congestion. CV: Negative for chest pain, angina, palpitations, dyspnea on exertion, peripheral edema.  Respiratory: Negative for dyspnea at rest, dyspnea on exertion, cough, sputum, wheezing.  GI: See history of present illness. GU:  Negative for dysuria, hematuria, urinary incontinence, urinary frequency, nocturnal urination.  Endo: Negative for unusual weight change.    Physical Examination:   BP (!) 155/76   Pulse 80   Temp (!) 97.3 F (36.3 C) (Oral)   Ht 4\' 11"  (1.499 m)   Wt 128 lb 3.2 oz (58.2 kg)   BMI 25.89 kg/m   General: Well-nourished, well-developed in no acute distress.  Eyes: No icterus. Mouth: Oropharyngeal mucosa moist and pink , no lesions erythema or exudate. Lungs: Clear to auscultation bilaterally.  Heart: Regular rate and rhythm, no murmurs rubs or gallops.  Abdomen: Bowel sounds are normal, mild tenderness noted periumbilical region, nondistended, no hepatosplenomegaly or masses, no abdominal bruits or hernia , no rebound or guarding.   Extremities: No lower extremity edema. No clubbing or deformities. Neuro: Alert and oriented x 4   Skin: Warm and dry, no jaundice.  Psych: Alert and cooperative, normal mood and affect. Imaging Studies: No results found.

## 2017-05-05 NOTE — Patient Instructions (Signed)
1. I will review your labs and provide further recommendations.  2. Please discuss dizziness with Dr. Nevada Crane at your upcoming visit.

## 2017-05-07 DIAGNOSIS — G894 Chronic pain syndrome: Secondary | ICD-10-CM | POA: Diagnosis not present

## 2017-05-07 DIAGNOSIS — R4181 Age-related cognitive decline: Secondary | ICD-10-CM | POA: Diagnosis not present

## 2017-05-07 DIAGNOSIS — F0390 Unspecified dementia without behavioral disturbance: Secondary | ICD-10-CM | POA: Diagnosis not present

## 2017-05-07 DIAGNOSIS — R1310 Dysphagia, unspecified: Secondary | ICD-10-CM | POA: Diagnosis not present

## 2017-05-07 DIAGNOSIS — H8149 Vertigo of central origin, unspecified ear: Secondary | ICD-10-CM | POA: Diagnosis not present

## 2017-05-07 DIAGNOSIS — I10 Essential (primary) hypertension: Secondary | ICD-10-CM | POA: Diagnosis not present

## 2017-05-07 DIAGNOSIS — H3530 Unspecified macular degeneration: Secondary | ICD-10-CM | POA: Diagnosis not present

## 2017-05-07 DIAGNOSIS — E1122 Type 2 diabetes mellitus with diabetic chronic kidney disease: Secondary | ICD-10-CM | POA: Diagnosis not present

## 2017-05-07 DIAGNOSIS — E441 Mild protein-calorie malnutrition: Secondary | ICD-10-CM | POA: Diagnosis not present

## 2017-05-07 DIAGNOSIS — M25569 Pain in unspecified knee: Secondary | ICD-10-CM | POA: Diagnosis not present

## 2017-05-07 DIAGNOSIS — F411 Generalized anxiety disorder: Secondary | ICD-10-CM | POA: Diagnosis not present

## 2017-05-08 NOTE — Assessment & Plan Note (Signed)
Vague abdominal pain with intermittent constipation. Poor appetite. Overall weight loss of 12 pounds in the last 18 months but stable last 3 months. Ongoing nausea +/- vomiting in setting of vertigo like symptoms.   Patient has labs for PCP today. Await findings. She will continue PPI. Encouraged her to follow up with PCP for dizziness. Further recommendations to follow.

## 2017-05-10 NOTE — Progress Notes (Signed)
CC'D TO PCP °

## 2017-06-07 DIAGNOSIS — M25551 Pain in right hip: Secondary | ICD-10-CM | POA: Diagnosis not present

## 2017-06-07 DIAGNOSIS — R42 Dizziness and giddiness: Secondary | ICD-10-CM | POA: Diagnosis not present

## 2017-06-07 DIAGNOSIS — H538 Other visual disturbances: Secondary | ICD-10-CM | POA: Diagnosis not present

## 2017-06-07 DIAGNOSIS — M542 Cervicalgia: Secondary | ICD-10-CM | POA: Diagnosis not present

## 2017-07-06 DIAGNOSIS — L609 Nail disorder, unspecified: Secondary | ICD-10-CM | POA: Diagnosis not present

## 2017-07-06 DIAGNOSIS — L110 Acquired keratosis follicularis: Secondary | ICD-10-CM | POA: Diagnosis not present

## 2017-07-06 DIAGNOSIS — E1151 Type 2 diabetes mellitus with diabetic peripheral angiopathy without gangrene: Secondary | ICD-10-CM | POA: Diagnosis not present

## 2017-09-01 DIAGNOSIS — Z961 Presence of intraocular lens: Secondary | ICD-10-CM | POA: Diagnosis not present

## 2017-09-01 DIAGNOSIS — E119 Type 2 diabetes mellitus without complications: Secondary | ICD-10-CM | POA: Diagnosis not present

## 2017-09-01 DIAGNOSIS — H353132 Nonexudative age-related macular degeneration, bilateral, intermediate dry stage: Secondary | ICD-10-CM | POA: Diagnosis not present

## 2017-09-21 DIAGNOSIS — L110 Acquired keratosis follicularis: Secondary | ICD-10-CM | POA: Diagnosis not present

## 2017-09-21 DIAGNOSIS — E1151 Type 2 diabetes mellitus with diabetic peripheral angiopathy without gangrene: Secondary | ICD-10-CM | POA: Diagnosis not present

## 2017-09-21 DIAGNOSIS — L609 Nail disorder, unspecified: Secondary | ICD-10-CM | POA: Diagnosis not present

## 2017-11-08 DIAGNOSIS — E782 Mixed hyperlipidemia: Secondary | ICD-10-CM | POA: Diagnosis not present

## 2017-11-08 DIAGNOSIS — D519 Vitamin B12 deficiency anemia, unspecified: Secondary | ICD-10-CM | POA: Diagnosis not present

## 2017-11-08 DIAGNOSIS — E559 Vitamin D deficiency, unspecified: Secondary | ICD-10-CM | POA: Diagnosis not present

## 2017-11-08 DIAGNOSIS — E1122 Type 2 diabetes mellitus with diabetic chronic kidney disease: Secondary | ICD-10-CM | POA: Diagnosis not present

## 2017-11-10 DIAGNOSIS — M545 Low back pain: Secondary | ICD-10-CM | POA: Diagnosis not present

## 2017-11-10 DIAGNOSIS — I10 Essential (primary) hypertension: Secondary | ICD-10-CM | POA: Diagnosis not present

## 2017-11-10 DIAGNOSIS — H8149 Vertigo of central origin, unspecified ear: Secondary | ICD-10-CM | POA: Diagnosis not present

## 2017-11-10 DIAGNOSIS — F411 Generalized anxiety disorder: Secondary | ICD-10-CM | POA: Diagnosis not present

## 2017-11-10 DIAGNOSIS — E782 Mixed hyperlipidemia: Secondary | ICD-10-CM | POA: Diagnosis not present

## 2017-11-10 DIAGNOSIS — M25569 Pain in unspecified knee: Secondary | ICD-10-CM | POA: Diagnosis not present

## 2017-11-10 DIAGNOSIS — M791 Myalgia, unspecified site: Secondary | ICD-10-CM | POA: Diagnosis not present

## 2017-11-10 DIAGNOSIS — H3530 Unspecified macular degeneration: Secondary | ICD-10-CM | POA: Diagnosis not present

## 2017-11-10 DIAGNOSIS — G894 Chronic pain syndrome: Secondary | ICD-10-CM | POA: Diagnosis not present

## 2017-11-10 DIAGNOSIS — E1169 Type 2 diabetes mellitus with other specified complication: Secondary | ICD-10-CM | POA: Diagnosis not present

## 2017-11-10 DIAGNOSIS — I4891 Unspecified atrial fibrillation: Secondary | ICD-10-CM | POA: Diagnosis not present

## 2017-11-10 DIAGNOSIS — F0390 Unspecified dementia without behavioral disturbance: Secondary | ICD-10-CM | POA: Diagnosis not present

## 2017-11-14 ENCOUNTER — Telehealth: Payer: Self-pay | Admitting: Gastroenterology

## 2017-11-14 NOTE — Telephone Encounter (Signed)
Please arrange for routine follow up of weight loss, abd pain.   Labs reviewed from 04/2017: CBC, LFTs, creatinine normal. To be scanned.

## 2017-11-15 ENCOUNTER — Encounter: Payer: Self-pay | Admitting: Internal Medicine

## 2017-11-15 NOTE — Telephone Encounter (Signed)
PATIENT SCHEDULED AND LETTER SENT  °

## 2017-11-23 DIAGNOSIS — E441 Mild protein-calorie malnutrition: Secondary | ICD-10-CM | POA: Diagnosis not present

## 2017-11-23 DIAGNOSIS — I129 Hypertensive chronic kidney disease with stage 1 through stage 4 chronic kidney disease, or unspecified chronic kidney disease: Secondary | ICD-10-CM | POA: Diagnosis not present

## 2017-11-23 DIAGNOSIS — Z6825 Body mass index (BMI) 25.0-25.9, adult: Secondary | ICD-10-CM | POA: Diagnosis not present

## 2017-11-23 DIAGNOSIS — I10 Essential (primary) hypertension: Secondary | ICD-10-CM | POA: Diagnosis not present

## 2017-11-23 DIAGNOSIS — I480 Paroxysmal atrial fibrillation: Secondary | ICD-10-CM | POA: Diagnosis not present

## 2017-12-08 ENCOUNTER — Other Ambulatory Visit: Payer: Self-pay

## 2017-12-08 ENCOUNTER — Encounter: Payer: Self-pay | Admitting: Cardiology

## 2017-12-08 ENCOUNTER — Ambulatory Visit (INDEPENDENT_AMBULATORY_CARE_PROVIDER_SITE_OTHER): Payer: Medicare Other | Admitting: Cardiology

## 2017-12-08 VITALS — BP 105/65 | HR 66 | Ht 59.0 in | Wt 135.0 lb

## 2017-12-08 DIAGNOSIS — I480 Paroxysmal atrial fibrillation: Secondary | ICD-10-CM | POA: Diagnosis not present

## 2017-12-08 DIAGNOSIS — I10 Essential (primary) hypertension: Secondary | ICD-10-CM | POA: Diagnosis not present

## 2017-12-08 MED ORDER — METOPROLOL TARTRATE 50 MG PO TABS: 50.0000 mg | ORAL_TABLET | Freq: Two times a day (BID) | ORAL | 1 refills | Status: DC

## 2017-12-08 MED ORDER — LOSARTAN POTASSIUM 50 MG PO TABS: 50.0000 mg | ORAL_TABLET | Freq: Every day | ORAL | 1 refills | Status: DC

## 2017-12-08 NOTE — Progress Notes (Signed)
Clinical Summary Amanda Hale is a 84 y.o.female last seen by Dr Johnsie Cancel, this is our first visit together. She is seen for the following medical problems.   1. Chest pain - prior notes describe history of atypical chest. She does have prior history of GI issues - 07/2015 CT PE negative.  - no recent symptoms.    2. Afib - new diagnosis by pcp during 10/2017 visit. EKG showed afib with RVR. She was asymptomatic. From my review EKG  I suspect probable aflutter with RVR - started on dilt 120mg  daily and eliquis.  - can have some palpitations at times.     SH: former wife of Itsel Opfer, also a patient of mine.   Past Medical History:  Diagnosis Date  . Arthritis   . DM type 2 (diabetes mellitus, type 2) ([place])   . Dyslipidemia   . Hiatal hernia 2014  . HTN (hypertension)   . Osteoporosis   . Reflux esophagitis 2014   erosive  . Schatzki's ring 2014  . Tubular adenoma      No Known Allergies   Current Outpatient Medications  Medication Sig Dispense Refill  . ALPRAZolam (XANAX) 0.5 MG tablet Take 0.5 mg by mouth as needed.     Marland Kitchen amoxicillin (AMOXIL) 500 MG capsule Take 4 capsules by mouth as needed. Takes before going to dentist  0  . Casanthranol-Docusate Sodium 30-100 MG CAPS Take 1 capsule by mouth as needed.    Marland Kitchen dexlansoprazole (DEXILANT) 60 MG capsule Take 1 capsule (60 mg total) by mouth daily. 30 capsule 11  . donepezil (ARICEPT) 5 MG tablet Take 10 mg by mouth at bedtime.     Marland Kitchen losartan-hydrochlorothiazide (HYZAAR) 100-12.5 MG per tablet Take 1 tablet by mouth daily.    . meclizine (ANTIVERT) 25 MG tablet Take 25 mg by mouth 4 (four) times daily as needed.    . metFORMIN (GLUCOPHAGE-XR) 500 MG 24 hr tablet Take 500 mg by mouth 2 (two) times daily.    . Multiple Vitamins-Minerals (PRESERVISION AREDS 2 PO) Take 1 tablet by mouth 2 (two) times daily.    . traMADol (ULTRAM) 50 MG tablet Take 50 mg by mouth every 6 (six) hours as needed for pain.     No current  facility-administered medications for this visit.      Past Surgical History:  Procedure Laterality Date  . ABDOMINAL HYSTERECTOMY    . APPENDECTOMY    . BREAST BIOPSY    . COLONOSCOPY N/A 09/08/2012   RMR: tubular adenoma, poor prep, needs surveillance April 2015   . COLONOSCOPY  1990/1992/1996   [facility], multiple hyperplastic polyps  . ESOPHAGOGASTRODUODENOSCOPY N/A 10/09/2015   RMR: pyloric stenosis dilated with scope.  . ESOPHAGOGASTRODUODENOSCOPY (EGD) WITH ESOPHAGEAL DILATION N/A 09/08/2012   RMR: Schatzki's ring s/p 54- F dilation, erosive esophagitis, hiatal hernia, gastric/bulb erosions with mild pyloric stenosis. Negative path  . EYE SURGERY     both  . HIP SURGERY     right x 2  . RECTAL SURGERY     prolapsed rectum/hemorrhoids     No Known Allergies    Family History  Problem Relation Age of Onset  . Hypertension Mother   . Hypertension Son   . Colon cancer Neg Hx      Social History Amanda Hale reports that she has never smoked. She has never used smokeless tobacco. Amanda Hale reports that she does not drink alcohol.   Review of Systems CONSTITUTIONAL: No  weight loss, fever, chills, weakness or fatigue.  HEENT: Eyes: No visual loss, blurred vision, double vision or yellow sclerae.No hearing loss, sneezing, congestion, runny nose or sore throat.  SKIN: No rash or itching.  CARDIOVASCULAR: per hpi RESPIRATORY: No shortness of breath, cough or sputum.  GASTROINTESTINAL: No anorexia, nausea, vomiting or diarrhea. No abdominal pain or blood.  GENITOURINARY: No burning on urination, no polyuria NEUROLOGICAL: No headache, dizziness, syncope, paralysis, ataxia, numbness or tingling in the extremities. No change in bowel or bladder control.  MUSCULOSKELETAL: No muscle, back pain, joint pain or stiffness.  LYMPHATICS: No enlarged nodes. No history of splenectomy.  PSYCHIATRIC: No history of depression or anxiety.  ENDOCRINOLOGIC: No reports  of sweating, cold or heat intolerance. No polyuria or polydipsia.  Marland Kitchen   Physical Examination Vitals:   12/08/17 1145  BP: 105/65  Pulse: 66  SpO2: 100%   Vitals:   12/08/17 1145  Weight: 135 lb (61.2 kg)  Height: 4\' 11"  (1.499 m)    Gen: resting comfortably, no acute distress HEENT: no scleral icterus, pupils equal round and reactive, no palptable cervical adenopathy,  CV: RRR, no m/r/g, no jvd Resp: Clear to auscultation bilaterally GI: abdomen is soft, non-tender, non-distended, normal bowel sounds, no hepatosplenomegaly MSK: extremities are warm, no edema.  Skin: warm, no rash Neuro:  no focal deficits Psych: appropriate affect   Diagnostic Studies 07/2015 echo Study Conclusions  - Left ventricle: The cavity size was normal. Wall thickness was   increased in a pattern of mild LVH. Systolic function was normal.   The estimated ejection fraction was in the range of 60% to 65%.   Wall motion was normal; there were no regional wall motion   abnormalities. Doppler parameters are consistent with abnormal   left ventricular relaxation (grade 1 diastolic dysfunction). - Aortic valve: Moderately calcified annulus. Mildly thickened,   mildly calcified leaflets. - Mitral valve: Calcified annulus. Mildly thickened, mildly   calcified leaflets .      Assessment and Plan   1. Afib - recent diagnosis, EKG today shows back in NSR - some symptoms at times. Currently on low dose beta blocker and CCB. Will d/c diltiazem, increase her lopressor to 50mg  bid, room to further titrate metoprolol as needed but caution given her first degree AV block - continue anticoagulation. CHADS2Vasc score is 5  2. HTN - stop hyzaar, start losartan 50mg  daily. This will allow more room with bp's for av nodal agents   Nursing visit 2 weeks for vitals check and EKG. F/u clinic in 4 months    Arnoldo Lenis, M.D.

## 2017-12-08 NOTE — Patient Instructions (Signed)
Your physician recommends that you schedule a follow-up appointment in: [place] AND EKG  Your physician has recommended you make the following change in your medication:   STOP DILTIAZEM   INCREASE LOPRESSOR 50 MG TWICE DAILY  STOP HYZAAR   START LOSARTAN 50 MG DAILY   Thank you for choosing [facility]!!

## 2017-12-14 DIAGNOSIS — E1151 Type 2 diabetes mellitus with diabetic peripheral angiopathy without gangrene: Secondary | ICD-10-CM | POA: Diagnosis not present

## 2017-12-14 DIAGNOSIS — L110 Acquired keratosis follicularis: Secondary | ICD-10-CM | POA: Diagnosis not present

## 2017-12-14 DIAGNOSIS — L609 Nail disorder, unspecified: Secondary | ICD-10-CM | POA: Diagnosis not present

## 2017-12-15 ENCOUNTER — Telehealth: Payer: Self-pay | Admitting: Cardiology

## 2017-12-15 NOTE — Telephone Encounter (Signed)
Patient stated Dr. Nevada Crane took her off metformin BID to just q day. Patient states her BS has been running high. Advised patient she needed to contact Dr. Nevada Crane regarding this matter

## 2017-12-15 NOTE — Telephone Encounter (Signed)
Patient called stating that she is having problems with elevated BP.  She has questions about her medications.  Please call [phone].

## 2017-12-16 ENCOUNTER — Other Ambulatory Visit: Payer: Self-pay

## 2017-12-19 ENCOUNTER — Encounter: Payer: Self-pay | Admitting: Cardiology

## 2017-12-23 ENCOUNTER — Ambulatory Visit (INDEPENDENT_AMBULATORY_CARE_PROVIDER_SITE_OTHER): Payer: Medicare Other

## 2017-12-23 ENCOUNTER — Telehealth: Payer: Self-pay | Admitting: Cardiology

## 2017-12-23 VITALS — BP 146/84 | HR 137

## 2017-12-23 DIAGNOSIS — I480 Paroxysmal atrial fibrillation: Secondary | ICD-10-CM | POA: Diagnosis not present

## 2017-12-23 MED ORDER — METOPROLOL TARTRATE 50 MG PO TABS: 75.0000 mg | ORAL_TABLET | Freq: Two times a day (BID) | ORAL | 3 refills | Status: DC

## 2017-12-23 NOTE — Progress Notes (Signed)
Patient came into office for nurse visit. EKG and vitals obtained. EKG in epic. Patient states she is much weaker than she was at last office visit and had an increase in shortness of breath. Patient had much difficulty walking down the hall. Reviewed vitals, EKG and chart with Dr. Bronson Ing who stated to increase metoprolol to 75 mg BID and to schedule and apt with Dr. Harl Bowie next available. Apt scheduled for 8/13. Advised patient if she got any worse before apt to go to the ER. Patient accompanied by daughter who verbalized understanding. Patient brought BP log from home 7/27 BP 119/57 7/28 BP 142/101 7/29 BP 124/81 7/30 BP 1331/80 8/1 BP 146/82 8/2 BP 124/81 8/3 BP 148/73 8/4 BP 135/73 8/5 BP 131/81 8/6 BP 145/86 8/7 BP 137/101

## 2017-12-23 NOTE — Telephone Encounter (Signed)
Advised daughter that was not in office today of the doctors recommendation. Daughter verbalized understanding.

## 2017-12-23 NOTE — Telephone Encounter (Signed)
Asking for results from EKG

## 2017-12-24 ENCOUNTER — Encounter (HOSPITAL_COMMUNITY): Payer: Self-pay | Admitting: *Deleted

## 2017-12-24 ENCOUNTER — Other Ambulatory Visit: Payer: Self-pay

## 2017-12-24 ENCOUNTER — Inpatient Hospital Stay (HOSPITAL_COMMUNITY)
Admission: EM | Admit: 2017-12-24 | Discharge: 2017-12-28 | DRG: 291 | Disposition: A | Discharge status: Home Health | Payer: Medicare Other | Attending: Internal Medicine | Admitting: Internal Medicine

## 2017-12-24 ENCOUNTER — Emergency Department (HOSPITAL_COMMUNITY): Payer: Medicare Other

## 2017-12-24 DIAGNOSIS — E876 Hypokalemia: Secondary | ICD-10-CM | POA: Diagnosis not present

## 2017-12-24 DIAGNOSIS — R0689 Other abnormalities of breathing: Secondary | ICD-10-CM | POA: Diagnosis not present

## 2017-12-24 DIAGNOSIS — Z90710 Acquired absence of both cervix and uterus: Secondary | ICD-10-CM

## 2017-12-24 DIAGNOSIS — Z7984 Long term (current) use of oral hypoglycemic drugs: Secondary | ICD-10-CM | POA: Diagnosis not present

## 2017-12-24 DIAGNOSIS — I340 Nonrheumatic mitral (valve) insufficiency: Secondary | ICD-10-CM | POA: Diagnosis not present

## 2017-12-24 DIAGNOSIS — Z7901 Long term (current) use of anticoagulants: Secondary | ICD-10-CM

## 2017-12-24 DIAGNOSIS — I110 Hypertensive heart disease with heart failure: Principal | ICD-10-CM | POA: Diagnosis present

## 2017-12-24 DIAGNOSIS — Z66 Do not resuscitate: Secondary | ICD-10-CM | POA: Diagnosis present

## 2017-12-24 DIAGNOSIS — E119 Type 2 diabetes mellitus without complications: Secondary | ICD-10-CM | POA: Diagnosis present

## 2017-12-24 DIAGNOSIS — I5031 Acute diastolic (congestive) heart failure: Secondary | ICD-10-CM | POA: Diagnosis present

## 2017-12-24 DIAGNOSIS — E785 Hyperlipidemia, unspecified: Secondary | ICD-10-CM | POA: Diagnosis present

## 2017-12-24 DIAGNOSIS — R42 Dizziness and giddiness: Secondary | ICD-10-CM | POA: Diagnosis not present

## 2017-12-24 DIAGNOSIS — Z79899 Other long term (current) drug therapy: Secondary | ICD-10-CM

## 2017-12-24 DIAGNOSIS — M810 Age-related osteoporosis without current pathological fracture: Secondary | ICD-10-CM | POA: Diagnosis present

## 2017-12-24 DIAGNOSIS — R531 Weakness: Secondary | ICD-10-CM | POA: Diagnosis not present

## 2017-12-24 DIAGNOSIS — R0602 Shortness of breath: Secondary | ICD-10-CM | POA: Diagnosis not present

## 2017-12-24 DIAGNOSIS — I10 Essential (primary) hypertension: Secondary | ICD-10-CM | POA: Diagnosis not present

## 2017-12-24 DIAGNOSIS — I4891 Unspecified atrial fibrillation: Secondary | ICD-10-CM | POA: Diagnosis present

## 2017-12-24 DIAGNOSIS — I5032 Chronic diastolic (congestive) heart failure: Secondary | ICD-10-CM

## 2017-12-24 DIAGNOSIS — K219 Gastro-esophageal reflux disease without esophagitis: Secondary | ICD-10-CM | POA: Diagnosis present

## 2017-12-24 DIAGNOSIS — I509 Heart failure, unspecified: Secondary | ICD-10-CM

## 2017-12-24 DIAGNOSIS — E118 Type 2 diabetes mellitus with unspecified complications: Secondary | ICD-10-CM | POA: Diagnosis not present

## 2017-12-24 DIAGNOSIS — J9811 Atelectasis: Secondary | ICD-10-CM | POA: Diagnosis not present

## 2017-12-24 DIAGNOSIS — L8990 Pressure ulcer of unspecified site, unspecified stage: Secondary | ICD-10-CM

## 2017-12-24 DIAGNOSIS — I482 Chronic atrial fibrillation: Secondary | ICD-10-CM | POA: Diagnosis present

## 2017-12-24 DIAGNOSIS — R000 Tachycardia, unspecified: Secondary | ICD-10-CM | POA: Diagnosis not present

## 2017-12-24 LAB — BASIC METABOLIC PANEL
Anion gap: 9 (ref 5–15)
BUN: 24 mg/dL — AB (ref 8–23)
CHLORIDE: 109 mmol/L (ref 98–111)
CO2: 25 mmol/L (ref 22–32)
CREATININE: 0.88 mg/dL (ref 0.44–1.00)
Calcium: 9.2 mg/dL (ref 8.9–10.3)
GFR calc Af Amer: >60 mL/min (ref >60)
GFR calc non Af Amer: 56 mL/min — ABNORMAL LOW (ref >60)
Glucose, Bld: 132 mg/dL — ABNORMAL HIGH (ref 70–99)
Potassium: 4.5 mmol/L (ref 3.5–5.1)
Sodium: 143 mmol/L (ref 135–145)

## 2017-12-24 LAB — TROPONIN I: Troponin I: <0.03 ng/mL (ref <0.03)

## 2017-12-24 LAB — URINALYSIS, ROUTINE W REFLEX MICROSCOPIC
Bilirubin Urine: NEGATIVE
GLUCOSE, UA: NEGATIVE mg/dL
Hgb urine dipstick: NEGATIVE
KETONES UR: NEGATIVE mg/dL
LEUKOCYTES UA: NEGATIVE
Nitrite: NEGATIVE
PH: 6 (ref 5.0–8.0)
Protein, ur: NEGATIVE mg/dL
Specific Gravity, Urine: 1.024 (ref 1.005–1.030)

## 2017-12-24 LAB — CBC WITH DIFFERENTIAL/PLATELET
BASOS ABS: 0.1 10*3/uL (ref 0.0–0.1)
Basophils Relative: 1 %
Eosinophils Absolute: 0.1 10*3/uL (ref 0.0–0.7)
Eosinophils Relative: 1 %
HEMATOCRIT: 37.3 % (ref 36.0–46.0)
Hemoglobin: 11.6 g/dL — ABNORMAL LOW (ref 12.0–15.0)
LYMPHS PCT: 12 %
Lymphs Abs: 0.9 10*3/uL (ref 0.7–4.0)
MCH: 29.4 pg (ref 26.0–34.0)
MCHC: 31.1 g/dL (ref 30.0–36.0)
MCV: 94.4 fL (ref 78.0–100.0)
MONO ABS: 0.4 10*3/uL (ref 0.1–1.0)
MONOS PCT: 6 %
NEUTROS ABS: 6.2 10*3/uL (ref 1.7–7.7)
Neutrophils Relative %: 80 %
Platelets: 290 10*3/uL (ref 150–400)
RBC: 3.95 MIL/uL (ref 3.87–5.11)
RDW: 14.8 % (ref 11.5–15.5)
WBC: 7.7 10*3/uL (ref 4.0–10.5)

## 2017-12-24 LAB — BRAIN NATRIURETIC PEPTIDE: B Natriuretic Peptide: 468 pg/mL — ABNORMAL HIGH (ref 0.0–100.0)

## 2017-12-24 LAB — GLUCOSE, CAPILLARY: Glucose-Capillary: 168 mg/dL — ABNORMAL HIGH (ref 70–99)

## 2017-12-24 MED ORDER — ACETAMINOPHEN 325 MG PO TABS: 650.0000 mg | ORAL_TABLET | Freq: Four times a day (QID) | ORAL | Status: DC | PRN

## 2017-12-24 MED ORDER — ONDANSETRON HCL 4 MG/2ML IJ SOLN: 4.0000 mg | Freq: Four times a day (QID) | INTRAMUSCULAR | Status: DC | PRN

## 2017-12-24 MED ORDER — INSULIN ASPART 100 UNIT/ML ~~LOC~~ SOLN
0.0000 [IU] | Freq: Three times a day (TID) | SUBCUTANEOUS | Status: DC
  Administered 2017-12-25: 1 [IU] via SUBCUTANEOUS
  Administered 2017-12-26: 2 [IU] via SUBCUTANEOUS
  Administered 2017-12-27: 1 [IU] via SUBCUTANEOUS
  Administered 2017-12-27: 2 [IU] via SUBCUTANEOUS

## 2017-12-24 MED ORDER — FUROSEMIDE 10 MG/ML IJ SOLN
20.0000 mg | Freq: Two times a day (BID) | INTRAMUSCULAR | Status: DC
  Administered 2017-12-24 – 2017-12-25 (×3): 20 mg via INTRAVENOUS
  Filled 2017-12-24 (×3): qty 2

## 2017-12-24 MED ORDER — LOSARTAN POTASSIUM 50 MG PO TABS
25.0000 mg | ORAL_TABLET | Freq: Every day | ORAL | Status: DC
  Administered 2017-12-25 – 2017-12-26 (×2): 25 mg via ORAL
  Filled 2017-12-24 (×3): qty 1

## 2017-12-24 MED ORDER — APIXABAN 2.5 MG PO TABS
2.5000 mg | ORAL_TABLET | Freq: Two times a day (BID) | ORAL | Status: DC
  Administered 2017-12-24 – 2017-12-28 (×8): 2.5 mg via ORAL
  Filled 2017-12-24 (×8): qty 1

## 2017-12-24 MED ORDER — ALPRAZOLAM 0.5 MG PO TABS
0.5000 mg | ORAL_TABLET | Freq: Two times a day (BID) | ORAL | Status: DC | PRN
  Administered 2017-12-25 – 2017-12-27 (×3): 0.5 mg via ORAL
  Filled 2017-12-24 (×3): qty 1

## 2017-12-24 MED ORDER — INSULIN ASPART 100 UNIT/ML ~~LOC~~ SOLN
0.0000 [IU] | Freq: Every day | SUBCUTANEOUS | Status: DC
  Administered 2017-12-27: 2 [IU] via SUBCUTANEOUS

## 2017-12-24 MED ORDER — METOPROLOL TARTRATE 50 MG PO TABS
75.0000 mg | ORAL_TABLET | Freq: Two times a day (BID) | ORAL | Status: DC
  Administered 2017-12-24: 75 mg via ORAL
  Filled 2017-12-24: qty 1

## 2017-12-24 MED ORDER — DONEPEZIL HCL 5 MG PO TABS
10.0000 mg | ORAL_TABLET | Freq: Every day | ORAL | Status: DC
  Administered 2017-12-24 – 2017-12-27 (×4): 10 mg via ORAL
  Filled 2017-12-24 (×4): qty 2

## 2017-12-24 MED ORDER — FUROSEMIDE 10 MG/ML IJ SOLN
20.0000 mg | Freq: Once | INTRAMUSCULAR | Status: AC
Start: 2017-12-24 — End: 2017-12-24
  Administered 2017-12-24: 20 mg via INTRAVENOUS
  Filled 2017-12-24: qty 2

## 2017-12-24 MED ORDER — SODIUM CHLORIDE 0.9% FLUSH: 3.0000 mL | INTRAVENOUS | Status: DC | PRN

## 2017-12-24 MED ORDER — SODIUM CHLORIDE 0.9 % IV SOLN: 250.0000 mL | INTRAVENOUS | Status: DC | PRN

## 2017-12-24 MED ORDER — METOPROLOL TARTRATE 50 MG PO TABS
100.0000 mg | ORAL_TABLET | Freq: Two times a day (BID) | ORAL | Status: DC
  Administered 2017-12-24 – 2017-12-28 (×6): 100 mg via ORAL
  Filled 2017-12-24 (×6): qty 2

## 2017-12-24 MED ORDER — MECLIZINE HCL 12.5 MG PO TABS
25.0000 mg | ORAL_TABLET | Freq: Four times a day (QID) | ORAL | Status: DC | PRN
Start: 2017-12-24 — End: 2017-12-28

## 2017-12-24 MED ORDER — ACETAMINOPHEN 325 MG PO TABS: 650.0000 mg | ORAL_TABLET | ORAL | Status: DC | PRN

## 2017-12-24 MED ORDER — SODIUM CHLORIDE 0.9% FLUSH
3.0000 mL | Freq: Two times a day (BID) | INTRAVENOUS | Status: DC
  Administered 2017-12-24 – 2017-12-28 (×8): 3 mL via INTRAVENOUS

## 2017-12-24 MED ORDER — HYDROCODONE-ACETAMINOPHEN 5-325 MG PO TABS: 1.0000 | ORAL_TABLET | Freq: Three times a day (TID) | ORAL | Status: DC | PRN

## 2017-12-24 MED ORDER — ACETAMINOPHEN 325 MG PO TABS
650.0000 mg | ORAL_TABLET | Freq: Once | ORAL | Status: AC
  Administered 2017-12-24: 650 mg via ORAL
  Filled 2017-12-24: qty 2

## 2017-12-24 NOTE — ED Notes (Signed)
EDP at bedside  

## 2017-12-24 NOTE — ED Triage Notes (Signed)
Brought in via ems for evaluation of weakness.states she has been week for the past 4 days.

## 2017-12-24 NOTE — ED Notes (Signed)
Pt given water per RN approval. 

## 2017-12-24 NOTE — H&P (Signed)
History and Physical    Amanda Hale HEN:[id number] DOB: Sep 20, 1925 DOA: 12/24/2017  PCP: Celene Squibb, MD  Patient coming from: home  I have personally briefly reviewed patient's old medical records in [place]  Chief Complaint: palpitations, shortness of breath  HPI: Amanda Hale is a 84 y.o. female with medical history significant of hypertension, diabetes, atrial fibrillation, presents to the hospital with complaints of palpitations and shortness of breath.  Patient reports noticing palpitations, which mostly occur at night for the past several weeks.  She had recently seen cardiology on 7/24 after she was noted to be in atrial fibrillation by her primary care physician.  She was started on rate control, but at that time it was noted that she was still occasionally tachycardic.  Over the past few weeks, she is noted worsening shortness of breath, dyspnea on exertion and worsening lower extremity edema.  She sleeps in a lift chair.  She has not had any chest pain.  No nausea or vomiting.  No diarrhea.  No fever.  No cough.  ED Course: She is noted to be in rapid atrial fibrillation.  Chest x-ray does not show any obvious CHF, but BNP is elevated.  She is noted to have significant peripheral edema.  She been referred for observation.  Review of Systems: As per HPI otherwise 10 point review of systems negative.    Past Medical History:  Diagnosis Date  . Arthritis   . DM type 2 (diabetes mellitus, type 2) ([place])   . Dyslipidemia   . Hiatal hernia 2014  . HTN (hypertension)   . Osteoporosis   . Reflux esophagitis 2014   erosive  . Schatzki's ring 2014  . Tubular adenoma     Past Surgical History:  Procedure Laterality Date  . ABDOMINAL HYSTERECTOMY    . APPENDECTOMY    . BREAST BIOPSY    . COLONOSCOPY N/A 09/08/2012   RMR: tubular adenoma, poor prep, needs surveillance April 2015   . COLONOSCOPY  1990/1992/1996   [facility], multiple hyperplastic  polyps  . ESOPHAGOGASTRODUODENOSCOPY N/A 10/09/2015   RMR: pyloric stenosis dilated with scope.  . ESOPHAGOGASTRODUODENOSCOPY (EGD) WITH ESOPHAGEAL DILATION N/A 09/08/2012   RMR: Schatzki's ring s/p 54- F dilation, erosive esophagitis, hiatal hernia, gastric/bulb erosions with mild pyloric stenosis. Negative path  . EYE SURGERY     both  . HIP SURGERY     right x 2  . RECTAL SURGERY     prolapsed rectum/hemorrhoids   Social history:  reports that she has never smoked. She has never used smokeless tobacco. She reports that she does not drink alcohol or use drugs.  No Known Allergies  Family History  Problem Relation Age of Onset  . Hypertension Mother   . Hypertension Son   . Colon cancer Neg Hx      Prior to Admission medications   Medication Sig Start Date End Date Taking? Authorizing Provider  ALPRAZolam Duanne Moron) 0.5 MG tablet Take 0.5 mg by mouth as needed.    Yes [provider]  donepezil (ARICEPT) 5 MG tablet Take 10 mg by mouth at bedtime.    Yes [provider]  losartan-hydrochlorothiazide (HYZAAR) 100-12.5 MG tablet Take 1 tablet by mouth daily. 06/15/16  Yes [provider]  metFORMIN (GLUCOPHAGE) 500 MG tablet Take 500 mg by mouth daily. 11/04/17  Yes [provider]  metoprolol tartrate (LOPRESSOR) 50 MG tablet Take 1.5 tablets (75 mg total) by mouth 2 (two) times  daily. 12/23/17 03/23/18 Yes Herminio Commons, MD  Multiple Vitamins-Minerals (PRESERVISION AREDS 2 PO) Take 1 tablet by mouth 2 (two) times daily.   Yes [provider]  amoxicillin (AMOXIL) 500 MG capsule Take 4 capsules by mouth as needed. Takes before going to dentist 03/17/17   [provider]  Casanthranol-Docusate Sodium 30-100 MG CAPS Take 1 capsule by mouth as needed.    [provider]  HYDROcodone-acetaminophen (NORCO/VICODIN) 5-325 MG tablet Take 1 tablet by mouth every 8 (eight) hours as needed. for pain 11/10/17   [provider]   meclizine (ANTIVERT) 25 MG tablet Take 25 mg by mouth 4 (four) times daily as needed.    [provider]    Physical Exam: Vitals:   12/24/17 1530 12/24/17 1600 12/24/17 1630 12/24/17 1651  BP: 120/79 126/90 128/77 128/77  Pulse: 90 94 66 (!) 112  Resp: 19 (!) 21 18 16   SpO2: 100% 100% 97% 100%  Weight:      Height:        Constitutional: NAD, calm, comfortable Vitals:   12/24/17 1530 12/24/17 1600 12/24/17 1630 12/24/17 1651  BP: 120/79 126/90 128/77 128/77  Pulse: 90 94 66 (!) 112  Resp: 19 (!) 21 18 16   SpO2: 100% 100% 97% 100%  Weight:      Height:       Eyes: PERRL, lids and conjunctivae normal ENMT: Mucous membranes are moist. Posterior pharynx clear of any exudate or lesions.Normal dentition.  Neck: normal, supple, no masses, no thyromegaly Respiratory: clear to auscultation bilaterally, no wheezing, no crackles. Normal respiratory effort. No accessory muscle use.  Cardiovascular: irregular rate and rhythm, no murmurs / rubs / gallops. 1-2+ extremity edema bilaterally. 2+ pedal pulses. No carotid bruits.  Abdomen: no tenderness, no masses palpated. No hepatosplenomegaly. Bowel sounds positive.  Musculoskeletal: no clubbing / cyanosis. No joint deformity upper and lower extremities. Good ROM, no contractures. Normal muscle tone.  Skin: no rashes, lesions, ulcers. No induration Neurologic: CN 2-12 grossly intact. Sensation intact, DTR normal. Strength 5/5 in all 4.  Psychiatric: Normal judgment and insight. Alert and oriented x 3. Normal mood.    Labs on Admission: I have personally reviewed following labs and imaging studies  CBC: Recent Labs  Lab 12/24/17 1209  WBC 7.7  NEUTROABS 6.2  HGB 11.6*  HCT 37.3  MCV 94.4  PLT 956   Basic Metabolic Panel: Recent Labs  Lab 12/24/17 1209  NA 143  K 4.5  CL 109  CO2 25  GLUCOSE 132*  BUN 24*  CREATININE 0.88  CALCIUM 9.2   GFR: Estimated Creatinine Clearance: 33.8 mL/min (by C-G formula based on  SCr of 0.88 mg/dL). Liver Function Tests: No results for input(s): AST, ALT, ALKPHOS, BILITOT, PROT, ALBUMIN in the last 168 hours. No results for input(s): LIPASE, AMYLASE in the last 168 hours. No results for input(s): AMMONIA in the last 168 hours. Coagulation Profile: No results for input(s): INR, PROTIME in the last 168 hours. Cardiac Enzymes: Recent Labs  Lab 12/24/17 1209  TROPONINI <0.03   BNP (last 3 results) No results for input(s): PROBNP in the last 8760 hours. HbA1C: No results for input(s): HGBA1C in the last 72 hours. CBG: No results for input(s): GLUCAP in the last 168 hours. Lipid Profile: No results for input(s): CHOL, HDL, LDLCALC, TRIG, CHOLHDL, LDLDIRECT in the last 72 hours. Thyroid Function Tests: No results for input(s): TSH, T4TOTAL, FREET4, T3FREE, THYROIDAB in the last 72 hours. Anemia Panel: No results  for input(s): VITAMINB12, FOLATE, FERRITIN, TIBC, IRON, RETICCTPCT in the last 72 hours. Urine analysis:    Component Value Date/Time   COLORURINE YELLOW 12/24/2017 1209   APPEARANCEUR HAZY (A) 12/24/2017 1209   LABSPEC 1.024 12/24/2017 1209   PHURINE 6.0 12/24/2017 1209   GLUCOSEU NEGATIVE 12/24/2017 1209   HGBUR NEGATIVE 12/24/2017 1209   BILIRUBINUR NEGATIVE 12/24/2017 1209   KETONESUR NEGATIVE 12/24/2017 1209   PROTEINUR NEGATIVE 12/24/2017 1209   NITRITE NEGATIVE 12/24/2017 1209   LEUKOCYTESUR NEGATIVE 12/24/2017 1209    Radiological Exams on Admission: Dg Chest 2 View  Result Date: 12/24/2017 CLINICAL DATA:  Weakness for 4 days, diabetes mellitus, hypertension EXAM: CHEST - 2 VIEW COMPARISON:  08/07/2015 FINDINGS: Upper normal size of cardiac silhouette. Atherosclerotic calcification aorta. Mediastinal contours and pulmonary vascularity normal. Minimal bronchitic changes and medial bibasilar atelectasis. Lungs otherwise clear. No acute infiltrate, pleural effusion or pneumothorax. RIGHT glenohumeral and BILATERAL AC joint degenerative  changes. Scattered endplate spur formation thoracic spine. IMPRESSION: Bronchitic changes with minimal bibasilar atelectasis. Electronically Signed   By: Lavonia Dana M.D.   On: 12/24/2017 13:06    EKG: Independently reviewed. Rapid atrial fibrillation  Assessment/Plan Active Problems:   HTN (hypertension)   Diabetes mellitus, type II (HCC)   Dyslipidemia   GERD (gastroesophageal reflux disease)   Atrial fibrillation with RVR (HCC)   Acute CHF (congestive heart failure) ([place])     1. Atrial fibrillation with rapid ventricular response.  Heart rate ranging from 90s to 120s.  Will increase her home dose of metoprolol to 100 mg twice daily.  Can consider adding calcium channel blockers if needed.  She is anticoagulated with Eliquis.  Check TSH. 2. Acute CHF.  She has evidence of volume overload with peripheral edema and dyspnea on exertion.  Will start on intravenous Lasix.  Monitor intake and output.  Echocardiogram will be ordered. 3. Hypertension.  Blood pressure currently stable.  Continue current dose of metoprolol.  Losartan also added as part of CHF order set. 4. Diabetes.  Hold oral agents.  Start on sliding scale insulin.  DVT prophylaxis: eliquis  Code Status: DNR  Family Communication:  No family present  Disposition Plan: discharge home once adequately diuresed  Consults called:   Admission status: inpatient, telemetry   Kathie Dike MD [facility] Pager [phone]  If 7PM-7AM, please contact night-coverage [email or web address] Password [email or web address]  12/24/2017, 5:11 PM

## 2017-12-24 NOTE — ED Provider Notes (Signed)
[facility] EMERGENCY DEPARTMENT Provider Note   CSN: [id number] Arrival date & time: 12/24/17  1111     History   Chief Complaint Chief Complaint  Patient presents with  . Weakness    HPI Amanda Hale is a 84 y.o. female.  She presents with complaint of weakness.  She says she is felt generally weak over the last 4 weeks and has been in the doctor's office sent to her cardiologist.  She is A. fib and they have recently been increasing her beta-blocker as of yesterday because her heart rate is been climbing.  Today while walking from the bathroom back to her chair she was too weak and felt like she was in a pass out and needed to stop and sit down.  She is had no cough no chest pain no abdominal pain.  During this episode today she felt nauseous and vomited once.  Said she was diaphoretic.  The symptoms are resolved now and she feels back to baseline.  There is been no falls.  She is had new edema on her lower extremities.  No fevers no chills no urinary symptoms.  She has loose stools but she attributes that to drinking boost shakes.  The history is provided by the patient and a relative.  Dizziness  Quality:  Lightheadedness Severity:  Severe Onset quality:  Sudden Timing:  Intermittent Progression:  Resolved Chronicity:  Recurrent Context: physical activity   Context: not with loss of consciousness   Relieved by:  Lying down Worsened by:  Standing up Associated symptoms: diarrhea, nausea, vomiting and weakness   Associated symptoms: no blood in stool, no chest pain, no headaches, no palpitations, no shortness of breath, no syncope and no vision changes   Risk factors: new medications     Past Medical History:  Diagnosis Date  . Arthritis   . DM type 2 (diabetes mellitus, type 2) ([place])   . Dyslipidemia   . Hiatal hernia 2014  . HTN (hypertension)   . Osteoporosis   . Reflux esophagitis 2014   erosive  . Schatzki's ring 2014  . Tubular adenoma     Patient Active  Problem List   Diagnosis Date Noted  . Periumbilical discomfort 74/04/8785  . Nausea with vomiting 05/05/2017  . Pyloric stenosis, acquired 11/06/2015  . Congenital hypertrophic pyloric stenosis   . Upper abdominal pain 09/13/2015  . Loss of weight 09/13/2015  . GERD (gastroesophageal reflux disease) 09/13/2015  . Esophageal motility disorder 02/21/2013  . Cough 02/21/2013  . Esophageal dysphagia 08/11/2012  . Chronic constipation 08/11/2012  . Personal history of colonic polyps 08/11/2012  . Chest pain 07/27/2012  . HTN (hypertension) 07/26/2012  . Diabetes mellitus, type II ([place]) 07/26/2012  . Dyslipidemia 07/26/2012    Past Surgical History:  Procedure Laterality Date  . ABDOMINAL HYSTERECTOMY    . APPENDECTOMY    . BREAST BIOPSY    . COLONOSCOPY N/A 09/08/2012   RMR: tubular adenoma, poor prep, needs surveillance April 2015   . COLONOSCOPY  1990/1992/1996   [facility], multiple hyperplastic polyps  . ESOPHAGOGASTRODUODENOSCOPY N/A 10/09/2015   RMR: pyloric stenosis dilated with scope.  . ESOPHAGOGASTRODUODENOSCOPY (EGD) WITH ESOPHAGEAL DILATION N/A 09/08/2012   RMR: Schatzki's ring s/p 54- F dilation, erosive esophagitis, hiatal hernia, gastric/bulb erosions with mild pyloric stenosis. Negative path  . EYE SURGERY     both  . HIP SURGERY     right x 2  . RECTAL SURGERY     prolapsed rectum/hemorrhoids  OB History   None      Home Medications    Prior to Admission medications   Medication Sig Start Date End Date Taking? Authorizing Provider  ALPRAZolam Duanne Moron) 0.5 MG tablet Take 0.5 mg by mouth as needed.     [provider]  amoxicillin (AMOXIL) 500 MG capsule Take 4 capsules by mouth as needed. Takes before going to dentist 03/17/17   [provider]  Casanthranol-Docusate Sodium 30-100 MG CAPS Take 1 capsule by mouth as needed.    [provider]  donepezil (ARICEPT) 5 MG tablet Take 10 mg by mouth at bedtime.      [provider]  ELIQUIS 2.5 MG TABS tablet Take 2.5 mg by mouth 2 (two) times daily. 12/06/17   [provider]  HYDROcodone-acetaminophen (NORCO/VICODIN) 5-325 MG tablet Take 1 tablet by mouth every 8 (eight) hours as needed. for pain 11/10/17   [provider]  losartan (COZAAR) 50 MG tablet Take 1 tablet (50 mg total) by mouth daily. 12/08/17 03/08/18  Arnoldo Lenis, MD  meclizine (ANTIVERT) 25 MG tablet Take 25 mg by mouth 4 (four) times daily as needed.    [provider]  metFORMIN (GLUCOPHAGE) 500 MG tablet Take 500 mg by mouth daily. 11/04/17   [provider]  metoprolol tartrate (LOPRESSOR) 50 MG tablet Take 1.5 tablets (75 mg total) by mouth 2 (two) times daily. 12/23/17 03/23/18  Herminio Commons, MD  Multiple Vitamins-Minerals (PRESERVISION AREDS 2 PO) Take 1 tablet by mouth 2 (two) times daily.    [provider]    Family History Family History  Problem Relation Age of Onset  . Hypertension Mother   . Hypertension Son   . Colon cancer Neg Hx     Social History Social History   Tobacco Use  . Smoking status: Never Smoker  . Smokeless tobacco: Never Used  Substance Use Topics  . Alcohol use: No  . Drug use: No     Allergies   Patient has no known allergies.   Review of Systems Review of Systems  Constitutional: Negative for fever.  HENT: Negative for sore throat.   Eyes: Negative for visual disturbance.  Respiratory: Negative for shortness of breath.   Cardiovascular: Positive for leg swelling. Negative for chest pain, palpitations and syncope.  Gastrointestinal: Positive for diarrhea, nausea and vomiting. Negative for abdominal pain and blood in stool.  Genitourinary: Negative for dysuria.  Musculoskeletal: Negative for neck pain.  Skin: Negative for rash.  Neurological: Positive for dizziness and weakness. Negative for headaches.     Physical Exam Updated Vital Signs BP (!) 142/59   Pulse (!)  120   Resp 16   Ht 4\' 11"  (1.499 m)   Wt 61.2 kg   SpO2 99%   BMI 27.27 kg/m   Physical Exam  Constitutional: She appears well-developed and well-nourished. No distress.  HENT:  Head: Normocephalic and atraumatic.  Eyes: Conjunctivae are normal.  Neck: Neck supple.  Cardiovascular: Normal pulses. An irregularly irregular rhythm present. Tachycardia present.  No murmur heard. Pulmonary/Chest: Effort normal and breath sounds normal. No respiratory distress.  Abdominal: Soft. There is no tenderness.  Musculoskeletal: Normal range of motion. She exhibits edema. She exhibits no deformity.  Neurological: She is alert. She has normal strength. GCS eye subscore is 4. GCS verbal subscore is 5. GCS motor subscore is 6.  Skin: Skin is warm and dry.  Psychiatric: She has a normal mood and affect.  Nursing note and  vitals reviewed.    ED Treatments / Results  Labs (all labs ordered are listed, but only abnormal results are displayed) Labs Reviewed  BASIC METABOLIC PANEL - Abnormal; Notable for the following components:      Result Value   Glucose, Bld 132 (*)    BUN 24 (*)    GFR calc non Af Amer 56 (*)    All other components within normal limits  BRAIN NATRIURETIC PEPTIDE - Abnormal; Notable for the following components:   B Natriuretic Peptide 468.0 (*)    All other components within normal limits  CBC WITH DIFFERENTIAL/PLATELET - Abnormal; Notable for the following components:   Hemoglobin 11.6 (*)    All other components within normal limits  URINALYSIS, ROUTINE W REFLEX MICROSCOPIC - Abnormal; Notable for the following components:   APPearance HAZY (*)    All other components within normal limits  BASIC METABOLIC PANEL - Abnormal; Notable for the following components:   Glucose, Bld 123 (*)    BUN 25 (*)    GFR calc non Af Amer 55 (*)    All other components within normal limits  GLUCOSE, CAPILLARY - Abnormal; Notable for the following components:   Glucose-Capillary 168  (*)    All other components within normal limits  GLUCOSE, CAPILLARY - Abnormal; Notable for the following components:   Glucose-Capillary 114 (*)    All other components within normal limits  GLUCOSE, CAPILLARY - Abnormal; Notable for the following components:   Glucose-Capillary 134 (*)    All other components within normal limits  GLUCOSE, CAPILLARY - Abnormal; Notable for the following components:   Glucose-Capillary 108 (*)    All other components within normal limits  BASIC METABOLIC PANEL - Abnormal; Notable for the following components:   CO2 35 (*)    Glucose, Bld 147 (*)    BUN 29 (*)    Creatinine, Ser 1.10 (*)    GFR calc non Af Amer 43 (*)    GFR calc Af Amer 50 (*)    All other components within normal limits  GLUCOSE, CAPILLARY - Abnormal; Notable for the following components:   Glucose-Capillary 107 (*)    All other components within normal limits  TROPONIN I  TSH    EKG EKG Interpretation  Date/Time:  Friday December 24 2017 11:38:58 EDT Ventricular Rate:  112 PR Interval:    QRS Duration: 64 QT Interval:  358 QTC Calculation: 488 R Axis:   45 Text Interpretation:  Atrial fibrillation with rapid ventricular response with premature ventricular or aberrantly conducted complexes Low voltage QRS Septal infarct , age undetermined Abnormal ECG no prior available Confirmed by Aletta Edouard [phone]) on 12/24/2017 12:07:36 PM   Radiology Dg Chest 2 View  Result Date: 12/24/2017 CLINICAL DATA:  Weakness for 4 days, diabetes mellitus, hypertension EXAM: CHEST - 2 VIEW COMPARISON:  08/07/2015 FINDINGS: Upper normal size of cardiac silhouette. Atherosclerotic calcification aorta. Mediastinal contours and pulmonary vascularity normal. Minimal bronchitic changes and medial bibasilar atelectasis. Lungs otherwise clear. No acute infiltrate, pleural effusion or pneumothorax. RIGHT glenohumeral and BILATERAL AC joint degenerative changes. Scattered endplate spur formation thoracic  spine. IMPRESSION: Bronchitic changes with minimal bibasilar atelectasis. Electronically Signed   By: Lavonia Dana M.D.   On: 12/24/2017 13:06    Procedures Procedures (including critical care time)  Medications Ordered in ED Medications - No data to display CHA2DS2/VAS Stroke Risk Points  Current as of 39 minutes ago     5 >= 2 Points: High Risk  1 - 1.99 Points: Medium Risk  0 Points: Low Risk    The patient's score has not changed in the past year.:  No Change     Details    This score determines the patient's risk of having a stroke if the  patient has atrial fibrillation.       Points Metrics  0 Has Congestive Heart Failure:  No    Current as of 39 minutes ago  0 Has Vascular Disease:  No    Current as of 39 minutes ago  1 Has Hypertension:  Yes    Current as of 39 minutes ago  2 Age:  84    Current as of 39 minutes ago  1 Has Diabetes:  Yes    Current as of 39 minutes ago  0 Had Stroke:  No  Had TIA:  No  Had thromboembolism:  No    Current as of 39 minutes ago  1 Female:  Yes    Current as of 39 minutes ago            Above score calculated as 1 point each if present [CHF, HTN, DM, Vascular=MI/PAD/Aortic Plaque, Age if 65-74, or Female] Above score calculated as 2 points each if present [Age > 75, or Stroke/TIA/TE]    Initial Impression / Assessment and Plan / ED Course  I have reviewed the triage vital signs and the nursing notes.  Pertinent labs & imaging results that were available during my care of the patient were reviewed by me and considered in my medical decision making (see chart for details).  Clinical Course as of Dec 26 828  Fri Dec 24, 2017  1214 Reviewed prior notes.  Patient is newly diagnosed with A. fib back in June and complaining of increased weakness lightheadedness over the past month.  They just increased her beta-blocker yesterday due to this increased weakness and noting her A. fib heart rate was above 100.  She states today's episode  was the most severe was associated with nausea vomiting and diaphoresis.  She is getting some screening labs EKG chest x-ray.   [MB]  9983 Patient's lab work is been pretty unremarkable other than her BNP which is elevated at 468.  Put in a call to cardiology to see if they have any recommendations.   [MB]  3825 Echo 3/17 - Study Conclusions  - Left ventricle: The cavity size was normal. Wall thickness was   increased in a pattern of mild LVH. Systolic function was normal.   The estimated ejection fraction was in the range of 60% to 65%.   Wall motion was normal; there were no regional wall motion   abnormalities. Doppler parameters are consistent with abnormal   left ventricular relaxation (grade 1 diastolic dysfunction). - Aortic valve: Moderately calcified annulus. Mildly thickened,   mildly calcified leaflets. - Mitral valve: Calcified annulus. Mildly thickened, mildly   calcified leaflets .   [MB]  0539 I discussed with Dr. Harrington Challenger regarding her symptoms and she feels she may be better served by coming and getting diuresed and getting her rate better controlled.   [MB]    Clinical Course User Index [MB] Hayden Rasmussen, MD     Final Clinical Impressions(s) / ED Diagnoses   Final diagnoses:  Acute congestive heart failure, unspecified heart failure type [facility])  Atrial fibrillation, unspecified type [facility])    ED Discharge Orders    None       Hayden Rasmussen, MD 12/26/17  0831  

## 2017-12-25 ENCOUNTER — Inpatient Hospital Stay (HOSPITAL_COMMUNITY): Payer: Medicare Other

## 2017-12-25 DIAGNOSIS — I340 Nonrheumatic mitral (valve) insufficiency: Secondary | ICD-10-CM

## 2017-12-25 LAB — ECHOCARDIOGRAM COMPLETE
Height: 59 in
Weight: 2222.24 oz

## 2017-12-25 LAB — BASIC METABOLIC PANEL
Anion gap: 7 (ref 5–15)
BUN: 25 mg/dL — AB (ref 8–23)
CHLORIDE: 105 mmol/L (ref 98–111)
CO2: 31 mmol/L (ref 22–32)
Calcium: 9.1 mg/dL (ref 8.9–10.3)
Creatinine, Ser: 0.89 mg/dL (ref 0.44–1.00)
GFR calc Af Amer: >60 mL/min (ref >60)
GFR calc non Af Amer: 55 mL/min — ABNORMAL LOW (ref >60)
Glucose, Bld: 123 mg/dL — ABNORMAL HIGH (ref 70–99)
POTASSIUM: 4.1 mmol/L (ref 3.5–5.1)
Sodium: 143 mmol/L (ref 135–145)

## 2017-12-25 LAB — GLUCOSE, CAPILLARY
GLUCOSE-CAPILLARY: 134 mg/dL — AB (ref 70–99)
GLUCOSE-CAPILLARY: 107 mg/dL — AB (ref 70–99)
Glucose-Capillary: 114 mg/dL — ABNORMAL HIGH (ref 70–99)
Glucose-Capillary: 108 mg/dL — ABNORMAL HIGH (ref 70–99)

## 2017-12-25 LAB — TSH: TSH: 1.685 u[IU]/mL (ref 0.350–4.500)

## 2017-12-25 MED ORDER — FUROSEMIDE 10 MG/ML IJ SOLN
20.0000 mg | Freq: Once | INTRAMUSCULAR | Status: AC
  Administered 2017-12-25: 20 mg via INTRAVENOUS
  Filled 2017-12-25: qty 2

## 2017-12-25 MED ORDER — FUROSEMIDE 10 MG/ML IJ SOLN
40.0000 mg | Freq: Two times a day (BID) | INTRAMUSCULAR | Status: DC
  Administered 2017-12-26 (×2): 40 mg via INTRAVENOUS
  Filled 2017-12-25 (×2): qty 4

## 2017-12-25 NOTE — Progress Notes (Signed)
Echocardiogram 2D Echocardiogram has been performed.  Amanda Hale 12/25/2017, 8:26 AM

## 2017-12-25 NOTE — Progress Notes (Signed)
PROGRESS NOTE    Amanda Hale  JOA:[id number] DOB: 1925-08-21 DOA: 12/24/2017 PCP: Celene Squibb, MD    Brief Narrative:  84 year old female with a history of hypertension, diabetes and atrial fibrillation, presents to the hospital with palpitations shortness of breath.  Found to be in decompensated CHF and rapid atrial fibrillation.  Admitted to the hospital for intravenous diuresis and rate control.   Assessment & Plan:   Active Problems:   HTN (hypertension)   Diabetes mellitus, type II (HCC)   Atrial fibrillation with RVR (HCC)   Acute CHF (congestive heart failure) ([place])   1. Atrial fibrillation with rapid ventricular response.  Baseline metoprolol dosing increased from 75 mg twice daily to 100 mg twice daily.  Heart rate still ranging in the 90-1 10 range.  We will continue current treatments.  If heart rate remains elevated tomorrow, can consider adding calcium channel blockers.  She is anticoagulated with Eliquis. 2. Acute diastolic congestive heart failure.  Echocardiogram shows normal ejection fraction.  Overall she is feeling mildly better.  Continue IV Lasix.  Urine output has not been very impressive.  Will increase dose from 20 mg to 40 mg daily.  She still has some evidence of volume overload. 3. Diabetes.  Holding oral agents.  On sliding scale insulin.  Blood sugars been stable. 4. Hypertension.  Blood pressure currently stable.  Continue on metoprolol and losartan   DVT prophylaxis: Eliquis Code Status: DNR Family Communication: Discussed with sister at the bedside Disposition Plan: Discharge home once improved   Consultants:     Procedures:  Echo: - Left ventricle: The cavity size was normal. Wall thickness was   normal. Systolic function was normal. The estimated ejection   fraction was in the range of 50% to 55%. Wall motion was normal;   there were no regional wall motion abnormalities. - Aortic valve: Mildly calcified annulus. Trileaflet; mildly  thickened leaflets. Valve area (VTI): 0.87 cm^2. Valve area   (Vmax): 0.97 cm^2. Valve area (Vmean): 0.88 cm^2. - Mitral valve: Mildly calcified annulus. Mildly thickened leaflets   . - Left atrium: The atrium was mildly dilated. - Right atrium: The atrium was mildly dilated.  - Atrial septum: No defect or patent foramen ovale was identified.  Antimicrobials:       Subjective: Feeling better today.  Has not experienced further palpitations.  Shortness of breath is mildly better, although she has not gotten up and moves around.  Objective: Vitals:   12/24/17 1832 12/24/17 2115 12/25/17 0500 12/25/17 0532  BP: 135/63 (!) 143/79  (!) 144/96  Pulse: 75 87  72  Resp: 18   16  Temp: 98.3 F (36.8 C) 97.9 F (36.6 C)  98 F (36.7 C)  TempSrc: Oral Oral  Oral  SpO2: 98% 100%  100%  Weight: 68.8 kg  63 kg   Height: 4\' 11"  (1.499 m)       Intake/Output Summary (Last 24 hours) at 12/25/2017 1801 Last data filed at 12/25/2017 1309 Gross per 24 hour  Intake 840 ml  Output 1450 ml  Net -610 ml   Filed Weights   12/24/17 1123 12/24/17 1832 12/25/17 0500  Weight: 61.2 kg 68.8 kg 63 kg    Examination:  General exam: Appears calm and comfortable  Respiratory system: Crackles at bases. Respiratory effort normal. Cardiovascular system: S1 & S2 heard, irregular. No JVD, murmurs, rubs, gallops or clicks. 1+ pedal edema. Gastrointestinal system: Abdomen is nondistended, soft and nontender. No organomegaly or masses felt.  Normal bowel sounds heard. Central nervous system: Alert and oriented. No focal neurological deficits. Extremities: Symmetric 5 x 5 power. Skin: No rashes, lesions or ulcers Psychiatry: Judgement and insight appear normal. Mood & affect appropriate.     Data Reviewed: I have personally reviewed following labs and imaging studies  CBC: Recent Labs  Lab 12/24/17 1209  WBC 7.7  NEUTROABS 6.2  HGB 11.6*  HCT 37.3  MCV 94.4  PLT 701   Basic Metabolic  Panel: Recent Labs  Lab 12/24/17 1209 12/25/17 0638  NA 143 143  K 4.5 4.1  CL 109 105  CO2 25 31  GLUCOSE 132* 123*  BUN 24* 25*  CREATININE 0.88 0.89  CALCIUM 9.2 9.1   GFR: Estimated Creatinine Clearance: 33.9 mL/min (by C-G formula based on SCr of 0.89 mg/dL). Liver Function Tests: No results for input(s): AST, ALT, ALKPHOS, BILITOT, PROT, ALBUMIN in the last 168 hours. No results for input(s): LIPASE, AMYLASE in the last 168 hours. No results for input(s): AMMONIA in the last 168 hours. Coagulation Profile: No results for input(s): INR, PROTIME in the last 168 hours. Cardiac Enzymes: Recent Labs  Lab 12/24/17 1209  TROPONINI <0.03   BNP (last 3 results) No results for input(s): PROBNP in the last 8760 hours. HbA1C: No results for input(s): HGBA1C in the last 72 hours. CBG: Recent Labs  Lab 12/24/17 2139 12/25/17 0743 12/25/17 1137 12/25/17 1631  GLUCAP 168* 114* 134* 108*   Lipid Profile: No results for input(s): CHOL, HDL, LDLCALC, TRIG, CHOLHDL, LDLDIRECT in the last 72 hours. Thyroid Function Tests: Recent Labs    12/25/17 0638  TSH 1.685   Anemia Panel: No results for input(s): VITAMINB12, FOLATE, FERRITIN, TIBC, IRON, RETICCTPCT in the last 72 hours. Sepsis Labs: No results for input(s): PROCALCITON, LATICACIDVEN in the last 168 hours.  No results found for this or any previous visit (from the past 240 hour(s)).       Radiology Studies: Dg Chest 2 View  Result Date: 12/24/2017 CLINICAL DATA:  Weakness for 4 days, diabetes mellitus, hypertension EXAM: CHEST - 2 VIEW COMPARISON:  08/07/2015 FINDINGS: Upper normal size of cardiac silhouette. Atherosclerotic calcification aorta. Mediastinal contours and pulmonary vascularity normal. Minimal bronchitic changes and medial bibasilar atelectasis. Lungs otherwise clear. No acute infiltrate, pleural effusion or pneumothorax. RIGHT glenohumeral and BILATERAL AC joint degenerative changes. Scattered  endplate spur formation thoracic spine. IMPRESSION: Bronchitic changes with minimal bibasilar atelectasis. Electronically Signed   By: Lavonia Dana M.D.   On: 12/24/2017 13:06        Scheduled Meds: . apixaban  2.5 mg Oral BID  . donepezil  10 mg Oral QHS  . furosemide  20 mg Intravenous BID  . insulin aspart  0-5 Units Subcutaneous QHS  . insulin aspart  0-9 Units Subcutaneous TID WC  . losartan  25 mg Oral Daily  . metoprolol tartrate  100 mg Oral BID  . sodium chloride flush  3 mL Intravenous Q12H   Continuous Infusions: . sodium chloride       LOS: 1 day    Time spent: 74mins    Kathie Dike, MD [facility] Pager [phone]  If 7PM-7AM, please contact night-coverage [email or web address] Password [email or web address] 12/25/2017, 6:01 PM

## 2017-12-26 LAB — GLUCOSE, CAPILLARY
GLUCOSE-CAPILLARY: 197 mg/dL — AB (ref 70–99)
GLUCOSE-CAPILLARY: 116 mg/dL — AB (ref 70–99)
GLUCOSE-CAPILLARY: 114 mg/dL — AB (ref 70–99)
Glucose-Capillary: 129 mg/dL — ABNORMAL HIGH (ref 70–99)

## 2017-12-26 LAB — BASIC METABOLIC PANEL
ANION GAP: 10 (ref 5–15)
BUN: 29 mg/dL — ABNORMAL HIGH (ref 8–23)
CHLORIDE: 98 mmol/L (ref 98–111)
CO2: 35 mmol/L — ABNORMAL HIGH (ref 22–32)
CREATININE: 1.1 mg/dL — AB (ref 0.44–1.00)
Calcium: 9.2 mg/dL (ref 8.9–10.3)
GFR calc non Af Amer: 43 mL/min — ABNORMAL LOW (ref >60)
GFR, EST AFRICAN AMERICAN: 50 mL/min — AB (ref >60)
Glucose, Bld: 147 mg/dL — ABNORMAL HIGH (ref 70–99)
POTASSIUM: 3.6 mmol/L (ref 3.5–5.1)
Sodium: 143 mmol/L (ref 135–145)

## 2017-12-26 MED ORDER — FUROSEMIDE 40 MG PO TABS
40.0000 mg | ORAL_TABLET | Freq: Every day | ORAL | Status: DC
  Administered 2017-12-26 – 2017-12-27 (×2): 40 mg via ORAL
  Filled 2017-12-26 (×2): qty 1

## 2017-12-26 MED ORDER — DILTIAZEM HCL 30 MG PO TABS
30.0000 mg | ORAL_TABLET | Freq: Two times a day (BID) | ORAL | Status: DC
  Administered 2017-12-26 – 2017-12-27 (×2): 30 mg via ORAL
  Filled 2017-12-26 (×2): qty 1

## 2017-12-26 NOTE — Progress Notes (Signed)
PROGRESS NOTE    Amanda Hale  EXH:[id number] DOB: 02/25/1926 DOA: 12/24/2017 PCP: Celene Squibb, MD    Brief Narrative:  84 year old female with a history of hypertension, diabetes and atrial fibrillation, presents to the hospital with palpitations shortness of breath.  Found to be in decompensated CHF and rapid atrial fibrillation.  Admitted to the hospital for intravenous diuresis and rate control.   Assessment & Plan:   Active Problems:   HTN (hypertension)   Diabetes mellitus, type II (HCC)   Atrial fibrillation with RVR (HCC)   Acute CHF (congestive heart failure) ([place])   1. Atrial fibrillation with rapid ventricular response.  Currently on metoprolol 100 mg twice daily.  Heart rate still ranging in the 110-120 range at rest.  We will add low-dose diltiazem.  Blood pressures are borderline and she will need to be monitored closely.  She is anticoagulated with Eliquis. 2. Acute diastolic congestive heart failure.  Echocardiogram shows normal ejection fraction.  Currently on IV Lasix.  Volume status has improved.  BUN/creatinine trending up with diuresis.  Will transition to oral Lasix. 3. Diabetes.  Holding oral agents.  On sliding scale insulin.  Blood sugars been stable. 4. Hypertension.  Blood pressure currently stable.  Continue on metoprolol.  Discontinue losartan supposed to allow for more flexibility with adjusting heart rate medications   DVT prophylaxis: Eliquis Code Status: DNR Family Communication: Discussed with sister at the bedside Disposition Plan: Discharge home once improved   Consultants:     Procedures:  Echo: - Left ventricle: The cavity size was normal. Wall thickness was   normal. Systolic function was normal. The estimated ejection   fraction was in the range of 50% to 55%. Wall motion was normal;   there were no regional wall motion abnormalities. - Aortic valve: Mildly calcified annulus. Trileaflet; mildly   thickened leaflets. Valve area (VTI):  0.87 cm^2. Valve area   (Vmax): 0.97 cm^2. Valve area (Vmean): 0.88 cm^2. - Mitral valve: Mildly calcified annulus. Mildly thickened leaflets   . - Left atrium: The atrium was mildly dilated. - Right atrium: The atrium was mildly dilated.  - Atrial septum: No defect or patent foramen ovale was identified.  Antimicrobials:       Subjective: Had some palpitations overnight.  Overall shortness of breath is better.  No chest pain.  Objective: Vitals:   12/26/17 0659 12/26/17 0659 12/26/17 1616 12/26/17 1619  BP:  113/77 122/90 104/64  Pulse:  93 89 (!) 116  Resp:  18 (!) 24 20  Temp:  (!) 97.4 F (36.3 C) 98.4 F (36.9 C)   TempSrc:  Oral Oral   SpO2:  100% 98% 97%  Weight: 63.7 kg     Height:        Intake/Output Summary (Last 24 hours) at 12/26/2017 1835 Last data filed at 12/26/2017 1500 Gross per 24 hour  Intake -  Output 2700 ml  Net -2700 ml   Filed Weights   12/24/17 1832 12/25/17 0500 12/26/17 0659  Weight: 68.8 kg 63 kg 63.7 kg    Examination:  General exam: Alert, awake, oriented x 3 Respiratory system: Clear to auscultation. Respiratory effort normal. Cardiovascular system:irregular. No murmurs, rubs, gallops. Gastrointestinal system: Abdomen is nondistended, soft and nontender. No organomegaly or masses felt. Normal bowel sounds heard. Central nervous system: Alert and oriented. No focal neurological deficits. Extremities: 1+ edema Skin: No rashes, lesions or ulcers Psychiatry: Judgement and insight appear normal. Mood & affect appropriate.  Data Reviewed: I have personally reviewed following labs and imaging studies  CBC: Recent Labs  Lab 12/24/17 1209  WBC 7.7  NEUTROABS 6.2  HGB 11.6*  HCT 37.3  MCV 94.4  PLT 498   Basic Metabolic Panel: Recent Labs  Lab 12/24/17 1209 12/25/17 0638 12/26/17 0602  NA 143 143 143  K 4.5 4.1 3.6  CL 109 105 98  CO2 25 31 35*  GLUCOSE 132* 123* 147*  BUN 24* 25* 29*  CREATININE 0.88 0.89 1.10*    CALCIUM 9.2 9.1 9.2   GFR: Estimated Creatinine Clearance: 27.6 mL/min (A) (by C-G formula based on SCr of 1.1 mg/dL (H)). Liver Function Tests: No results for input(s): AST, ALT, ALKPHOS, BILITOT, PROT, ALBUMIN in the last 168 hours. No results for input(s): LIPASE, AMYLASE in the last 168 hours. No results for input(s): AMMONIA in the last 168 hours. Coagulation Profile: No results for input(s): INR, PROTIME in the last 168 hours. Cardiac Enzymes: Recent Labs  Lab 12/24/17 1209  TROPONINI <0.03   BNP (last 3 results) No results for input(s): PROBNP in the last 8760 hours. HbA1C: No results for input(s): HGBA1C in the last 72 hours. CBG: Recent Labs  Lab 12/25/17 1631 12/25/17 2157 12/26/17 0906 12/26/17 1146 12/26/17 1618  GLUCAP 108* 107* 197* 116* 114*   Lipid Profile: No results for input(s): CHOL, HDL, LDLCALC, TRIG, CHOLHDL, LDLDIRECT in the last 72 hours. Thyroid Function Tests: Recent Labs    12/25/17 0638  TSH 1.685   Anemia Panel: No results for input(s): VITAMINB12, FOLATE, FERRITIN, TIBC, IRON, RETICCTPCT in the last 72 hours. Sepsis Labs: No results for input(s): PROCALCITON, LATICACIDVEN in the last 168 hours.  No results found for this or any previous visit (from the past 240 hour(s)).       Radiology Studies: No results found.      Scheduled Meds: . apixaban  2.5 mg Oral BID  . diltiazem  30 mg Oral Q12H  . donepezil  10 mg Oral QHS  . furosemide  40 mg Oral Daily  . insulin aspart  0-5 Units Subcutaneous QHS  . insulin aspart  0-9 Units Subcutaneous TID WC  . losartan  25 mg Oral Daily  . metoprolol tartrate  100 mg Oral BID  . sodium chloride flush  3 mL Intravenous Q12H   Continuous Infusions: . sodium chloride       LOS: 2 days    Time spent: 75mins    Kathie Dike, MD [facility] Pager [phone]  If 7PM-7AM, please contact night-coverage [email or web address] Password [facility] 12/26/2017, 6:35 PM

## 2017-12-27 DIAGNOSIS — I5031 Acute diastolic (congestive) heart failure: Secondary | ICD-10-CM

## 2017-12-27 DIAGNOSIS — I4891 Unspecified atrial fibrillation: Secondary | ICD-10-CM

## 2017-12-27 DIAGNOSIS — L8990 Pressure ulcer of unspecified site, unspecified stage: Secondary | ICD-10-CM

## 2017-12-27 LAB — GLUCOSE, CAPILLARY
GLUCOSE-CAPILLARY: 137 mg/dL — AB (ref 70–99)
GLUCOSE-CAPILLARY: 233 mg/dL — AB (ref 70–99)
Glucose-Capillary: 176 mg/dL — ABNORMAL HIGH (ref 70–99)
Glucose-Capillary: 96 mg/dL (ref 70–99)

## 2017-12-27 LAB — BASIC METABOLIC PANEL
ANION GAP: 12 (ref 5–15)
BUN: 35 mg/dL — ABNORMAL HIGH (ref 8–23)
CALCIUM: 8.9 mg/dL (ref 8.9–10.3)
CHLORIDE: 97 mmol/L — AB (ref 98–111)
CO2: 33 mmol/L — ABNORMAL HIGH (ref 22–32)
Creatinine, Ser: 1.13 mg/dL — ABNORMAL HIGH (ref 0.44–1.00)
GFR calc Af Amer: 48 mL/min — ABNORMAL LOW (ref >60)
GFR calc non Af Amer: 41 mL/min — ABNORMAL LOW (ref >60)
Glucose, Bld: 147 mg/dL — ABNORMAL HIGH (ref 70–99)
Potassium: 3.3 mmol/L — ABNORMAL LOW (ref 3.5–5.1)
SODIUM: 142 mmol/L (ref 135–145)

## 2017-12-27 MED ORDER — DILTIAZEM HCL 60 MG PO TABS
60.0000 mg | ORAL_TABLET | Freq: Two times a day (BID) | ORAL | Status: DC
Start: 2017-12-27 — End: 2017-12-27

## 2017-12-27 MED ORDER — DILTIAZEM HCL 30 MG PO TABS
30.0000 mg | ORAL_TABLET | Freq: Two times a day (BID) | ORAL | Status: DC
  Administered 2017-12-27: 30 mg via ORAL
  Filled 2017-12-27: qty 1

## 2017-12-27 MED ORDER — SODIUM CHLORIDE 0.9 % IV BOLUS
250.0000 mL | Freq: Once | INTRAVENOUS | Status: AC
  Administered 2017-12-27: 250 mL via INTRAVENOUS

## 2017-12-27 MED ORDER — POTASSIUM CHLORIDE CRYS ER 20 MEQ PO TBCR
40.0000 meq | EXTENDED_RELEASE_TABLET | Freq: Once | ORAL | Status: AC
  Administered 2017-12-27: 40 meq via ORAL
  Filled 2017-12-27: qty 2

## 2017-12-27 NOTE — Progress Notes (Signed)
PROGRESS NOTE    Amanda Hale  WJX:[id number] DOB: 16-Feb-1926 DOA: 12/24/2017 PCP: Celene Squibb, MD   Brief Narrative:   84 year old female with a history of hypertension, diabetes and atrial fibrillation, presents to the hospital with palpitations shortness of breath.  Found to be in decompensated CHF and rapid atrial fibrillation.  Admitted to the hospital for intravenous diuresis and rate control.  Cardizem had been added yesterday for improved heart rate control, but heart rate remains in the 110-120 bpm range today.   Assessment & Plan:   Active Problems:   HTN (hypertension)   Diabetes mellitus, type II (HCC)   Atrial fibrillation with RVR (HCC)   Acute CHF (congestive heart failure) ([place])   1. Atrial fibrillation with rapid ventricular response-persistent.  Currently on metoprolol 100 mg twice daily.  Heart rate still ranging in the 110-120 range at rest.    Diltiazem 30 mg twice daily increased to 60 mg twice daily today and will continue to monitor on telemetry as well as blood pressures. She is anticoagulated with Eliquis. 2. Acute diastolic congestive heart failure.  Echocardiogram shows normal ejection fraction.    Hold Lasix today.  BUN and creatinine are currently uptrending.  She otherwise appears euvolemic and may resume this on discharge. 3. Mild hypokalemia.  Replete orally and check magnesium in a.m. 4. Diabetes.  Holding oral agents.  On sliding scale insulin.  Blood sugars been stable. 5. Hypertension.  Blood pressure currently stable, but with soft blood pressure readings.  Continue on metoprolol.  Discontinue losartan supposed to allow for more flexibility with adjusting heart rate medications   DVT prophylaxis: Eliquis Code Status: DNR Family Communication: Discussed with sister at the bedside Disposition Plan: Discharge home once improved   Consultants:   None  Procedures:  Echo: - Left ventricle: The cavity size was normal. Wall thickness  was normal. Systolic function was normal. The estimated ejection fraction was in the range of 50% to 55%. Wall motion was normal; there were no regional wall motion abnormalities. - Aortic valve: Mildly calcified annulus. Trileaflet; mildly thickened leaflets. Valve area (VTI): 0.87 cm^2. Valve area (Vmax): 0.97 cm^2. Valve area (Vmean): 0.88 cm^2. - Mitral valve: Mildly calcified annulus. Mildly thickened leaflets . - Left atrium: The atrium was mildly dilated. - Right atrium: The atrium was mildly dilated.  - Atrial septum: No defect or patent foramen ovale was identified.  Antimicrobials: None  Subjective: Patient seen and evaluated today with no new acute complaints or concerns. No acute concerns or events noted overnight.  She denies any further palpitations.  Objective: Vitals:   12/27/17 0500 12/27/17 0512 12/27/17 0600 12/27/17 1046  BP:  (!) 138/113 124/78 100/70  Pulse:  68  79  Resp:  18    Temp:  97.8 F (36.6 C)    TempSrc:  Oral    SpO2:  99%    Weight: 60.3 kg     Height:        Intake/Output Summary (Last 24 hours) at 12/27/2017 1213 Last data filed at 12/27/2017 0800 Gross per 24 hour  Intake 480 ml  Output 900 ml  Net -420 ml   Filed Weights   12/25/17 0500 12/26/17 0659 12/27/17 0500  Weight: 63 kg 63.7 kg 60.3 kg    Examination:  General exam: Appears calm and comfortable  Respiratory system: Clear to auscultation. Respiratory effort normal. Cardiovascular system: S1 & S2 heard, irregular with tachycardia. No JVD, murmurs, rubs, gallops or clicks. No pedal edema.  Gastrointestinal system: Abdomen is nondistended, soft and nontender. No organomegaly or masses felt. Normal bowel sounds heard. Central nervous system: Alert and oriented. No focal neurological deficits. Extremities: Symmetric 5 x 5 power. Skin: No rashes, lesions or ulcers Psychiatry: Judgement and insight appear normal. Mood & affect appropriate.     Data Reviewed: I  have personally reviewed following labs and imaging studies  CBC: Recent Labs  Lab 12/24/17 1209  WBC 7.7  NEUTROABS 6.2  HGB 11.6*  HCT 37.3  MCV 94.4  PLT 517   Basic Metabolic Panel: Recent Labs  Lab 12/24/17 1209 12/25/17 0638 12/26/17 0602 12/27/17 0450  NA 143 143 143 142  K 4.5 4.1 3.6 3.3*  CL 109 105 98 97*  CO2 25 31 35* 33*  GLUCOSE 132* 123* 147* 147*  BUN 24* 25* 29* 35*  CREATININE 0.88 0.89 1.10* 1.13*  CALCIUM 9.2 9.1 9.2 8.9   GFR: Estimated Creatinine Clearance: 26.1 mL/min (A) (by C-G formula based on SCr of 1.13 mg/dL (H)). Liver Function Tests: No results for input(s): AST, ALT, ALKPHOS, BILITOT, PROT, ALBUMIN in the last 168 hours. No results for input(s): LIPASE, AMYLASE in the last 168 hours. No results for input(s): AMMONIA in the last 168 hours. Coagulation Profile: No results for input(s): INR, PROTIME in the last 168 hours. Cardiac Enzymes: Recent Labs  Lab 12/24/17 1209  TROPONINI <0.03   BNP (last 3 results) No results for input(s): PROBNP in the last 8760 hours. HbA1C: No results for input(s): HGBA1C in the last 72 hours. CBG: Recent Labs  Lab 12/26/17 1146 12/26/17 1618 12/26/17 2202 12/27/17 0718 12/27/17 1119  GLUCAP 116* 114* 129* 137* 176*   Lipid Profile: No results for input(s): CHOL, HDL, LDLCALC, TRIG, CHOLHDL, LDLDIRECT in the last 72 hours. Thyroid Function Tests: Recent Labs    12/25/17 0638  TSH 1.685   Anemia Panel: No results for input(s): VITAMINB12, FOLATE, FERRITIN, TIBC, IRON, RETICCTPCT in the last 72 hours. Sepsis Labs: No results for input(s): PROCALCITON, LATICACIDVEN in the last 168 hours.  No results found for this or any previous visit (from the past 240 hour(s)).       Radiology Studies: No results found.      Scheduled Meds: . apixaban  2.5 mg Oral BID  . diltiazem  60 mg Oral Q12H  . donepezil  10 mg Oral QHS  . furosemide  40 mg Oral Daily  . insulin aspart  0-5 Units  Subcutaneous QHS  . insulin aspart  0-9 Units Subcutaneous TID WC  . metoprolol tartrate  100 mg Oral BID  . potassium chloride  40 mEq Oral Once  . sodium chloride flush  3 mL Intravenous Q12H   Continuous Infusions: . sodium chloride       LOS: 3 days    Time spent: 30 minutes    Treana Lacour Darleen Crocker, DO [facility] Pager [phone]  If 7PM-7AM, please contact night-coverage [email or web address] Password [facility] 12/27/2017, 12:13 PM

## 2017-12-27 NOTE — Care Management Note (Signed)
Case Management Note  Patient Details  Name: Amanda Hale MRN: [id number] Date of Birth: Sep 25, 1925  Subjective/Objective:    Admitted with a-fib and acute CHF. Pt from home, lives with two daughters, has son at bedside. Pt is ind at baseline, she uses [facility] as needed. She has never had HH in the past. She has insurance with drug coverage, no difficulty affording medications. She reports medication compliance, says she does not know the names of her medications but knows what they look like and what they do. She fixes her own medications. She says she was seen in her cardiologist office last week, felt bad and continued to feel worse later in the week and eventually called EMS via her "life alter" system.    Action/Plan: DC home with [place] f/u. She is interested in the CHF program though [facility]. Referral given to Octavia Bruckner, [facility] rep. Anticipate DC home tomorrow, CM will follow and update HHA at DC.   Expected Discharge Date:  12/26/17               Expected Discharge Plan:  [place]  In-House Referral:  NA  Discharge planning Services  CM Consult  Post Acute Care Choice:  Home Health Choice offered to:  Patient  HH Arranged:  RN [place] Agency:  [facility] (formerly [employer])  Status of Service:  In process, will continue to follow  If discussed at Long Length of Stay Meetings, dates discussed:    Additional Comments:  Sherald Barge, RN 12/27/2017, 3:19 PM

## 2017-12-27 NOTE — Care Management Important Message (Signed)
Important Message  Patient Details  Name: Amanda Hale MRN: [id number] Date of Birth: 16-May-1926   Medicare Important Message Given:  Yes    Shelda Altes 12/27/2017, 12:42 PM

## 2017-12-28 ENCOUNTER — Ambulatory Visit: Payer: Medicare Other | Admitting: Cardiology

## 2017-12-28 LAB — GLUCOSE, CAPILLARY
GLUCOSE-CAPILLARY: 125 mg/dL — AB (ref 70–99)
Glucose-Capillary: 144 mg/dL — ABNORMAL HIGH (ref 70–99)
Glucose-Capillary: 125 mg/dL — ABNORMAL HIGH (ref 70–99)

## 2017-12-28 LAB — BASIC METABOLIC PANEL
Anion gap: 9 (ref 5–15)
BUN: 35 mg/dL — ABNORMAL HIGH (ref 8–23)
CALCIUM: 8.8 mg/dL — AB (ref 8.9–10.3)
CO2: 30 mmol/L (ref 22–32)
Chloride: 101 mmol/L (ref 98–111)
Creatinine, Ser: 0.95 mg/dL (ref 0.44–1.00)
GFR calc non Af Amer: 51 mL/min — ABNORMAL LOW (ref >60)
GFR, EST AFRICAN AMERICAN: 59 mL/min — AB (ref >60)
GLUCOSE: 135 mg/dL — AB (ref 70–99)
POTASSIUM: 3.8 mmol/L (ref 3.5–5.1)
SODIUM: 140 mmol/L (ref 135–145)

## 2017-12-28 LAB — MAGNESIUM: MAGNESIUM: 2.1 mg/dL (ref 1.7–2.4)

## 2017-12-28 MED ORDER — POTASSIUM CHLORIDE CRYS ER 20 MEQ PO TBCR
20.0000 meq | EXTENDED_RELEASE_TABLET | Freq: Once | ORAL | Status: AC
  Administered 2017-12-28: 20 meq via ORAL
  Filled 2017-12-28: qty 1

## 2017-12-28 MED ORDER — DILTIAZEM HCL ER COATED BEADS 120 MG PO CP24
120.0000 mg | ORAL_CAPSULE | Freq: Every day | ORAL | Status: DC
  Administered 2017-12-28: 120 mg via ORAL
  Filled 2017-12-28: qty 1

## 2017-12-28 MED ORDER — MAGNESIUM SULFATE 2 GM/50ML IV SOLN
2.0000 g | Freq: Once | INTRAVENOUS | Status: AC
  Administered 2017-12-28: 2 g via INTRAVENOUS
  Filled 2017-12-28: qty 50

## 2017-12-28 MED ORDER — DILTIAZEM HCL ER COATED BEADS 120 MG PO CP24: 120.0000 mg | ORAL_CAPSULE | Freq: Every day | ORAL | 2 refills | Status: DC

## 2017-12-28 MED ORDER — METOPROLOL TARTRATE 100 MG PO TABS: 100.0000 mg | ORAL_TABLET | Freq: Two times a day (BID) | ORAL | 2 refills | Status: DC

## 2017-12-28 MED ORDER — APIXABAN 2.5 MG PO TABS: 2.5000 mg | ORAL_TABLET | Freq: Two times a day (BID) | ORAL | 2 refills | Status: DC

## 2017-12-28 MED ORDER — METOPROLOL TARTRATE 5 MG/5ML IV SOLN
5.0000 mg | Freq: Once | INTRAVENOUS | Status: AC
  Administered 2017-12-28: 5 mg via INTRAVENOUS
  Filled 2017-12-28: qty 5

## 2017-12-28 NOTE — Discharge Summary (Signed)
Physician Discharge Summary  Amanda Hale QIH:[id number] DOB: [birth year] DOA: 12/24/2017  PCP: Celene Squibb, MD  Admit date: 12/24/2017  Discharge date: 12/28/2017  Admitted From:Home  Disposition:  Home  Recommendations for Outpatient Follow-up:  1. Follow up with PCP in 1-2 weeks 2. Follow-up with cardiology on 01/24/2018 as scheduled  Home Health: Heart failure program  Equipment/Devices: None  Discharge Condition: Stable  CODE STATUS: DNR  Diet recommendation: Heart Healthy/Carb Modified  Brief/Interim Summary:  84 year old female with a history of hypertension, diabetes and atrial fibrillation, presents to the hospital with palpitations shortness of breath. Found to be in decompensated CHF and rapid atrial fibrillation. Admitted to the hospital for intravenous diuresis and rate control.    Her home metoprolol dose was increased to 100 mg twice daily with no improvement to her heart rate and therefore Cardizem was added.  She now remains with a heart rate between 100 to 110 bpm and is no longer symptomatic even with ambulation.  Her blood pressure has remained stable.  I have recommended that she discontinue her home losartan/HCTZ to avoid low blood pressure readings while she is on these medications.  She will follow-up with cardiology as described above.  She also remains on anticoagulation with Eliquis.  Discharge Diagnoses:  Active Problems:   HTN (hypertension)   Diabetes mellitus, type II (HCC)   Atrial fibrillation with RVR (HCC)   Acute CHF (congestive heart failure) (HCC)   Pressure injury of skin  1. Atrial fibrillation with rapid ventricular response-improved.Currently on metoprolol 100 mg twice daily. Along with Cardizem CD 120 mg daily.  She is anticoagulated with Eliquis.  She was able to ambulate prior to discharge with no symptoms and remained in the high 90th percentile oxygen saturation.  She will be part of the heart failure program at home and will follow  up with cardiology as noted above. 2. Acute diastolic congestive heart failure-resolved. Echocardiogram shows normal ejection fraction.     Will hold losartan/HCTZ on discharge and continue on metoprolol and Cardizem as prescribed. 3. Mild hypokalemia-repleted.  Follow-up BMP in outpatient setting. 4. Diabetes.  Resume home agents.. 5. Hypertension. Blood pressure currently stable, and will now hold losartan/HCTZ as patient has been started on Cardizem and metoprolol.  Discharge Instructions  Discharge Instructions    Diet - low sodium heart healthy   Complete by:  As directed    Increase activity slowly   Complete by:  As directed      Allergies as of 12/28/2017   No Known Allergies     Medication List    STOP taking these medications   losartan-hydrochlorothiazide 100-12.5 MG tablet Commonly known as:  HYZAAR     TAKE these medications   ALPRAZolam 0.5 MG tablet Commonly known as:  XANAX Take 0.5 mg by mouth as needed.   amoxicillin 500 MG capsule Commonly known as:  AMOXIL Take 4 capsules by mouth as needed. Takes before going to dentist   apixaban 2.5 MG Tabs tablet Commonly known as:  ELIQUIS Take 1 tablet (2.5 mg total) by mouth 2 (two) times daily.   Casanthranol-Docusate Sodium 30-100 MG Caps Take 1 capsule by mouth as needed.   diltiazem 120 MG 24 hr capsule Commonly known as:  CARDIZEM CD Take 1 capsule (120 mg total) by mouth daily. Start taking on:  12/29/2017   donepezil 5 MG tablet Commonly known as:  ARICEPT Take 10 mg by mouth at bedtime.   HYDROcodone-acetaminophen 5-325 MG tablet Commonly known as:  NORCO/VICODIN Take 1 tablet by mouth every 8 (eight) hours as needed. for pain   meclizine 25 MG tablet Commonly known as:  ANTIVERT Take 25 mg by mouth 4 (four) times daily as needed.   metFORMIN 500 MG tablet Commonly known as:  GLUCOPHAGE Take 500 mg by mouth daily.   metoprolol tartrate 100 MG tablet Commonly known as:  LOPRESSOR Take  1 tablet (100 mg total) by mouth 2 (two) times daily. What changed:    medication strength  how much to take   PRESERVISION AREDS 2 PO Take 1 tablet by mouth 2 (two) times daily.      Follow-up Information    Imogene Burn, PA-C Follow up on 01/24/2018.   Specialty:  Cardiology Why:  Cardiology Hospital Follow-Up on 01/24/2018 at 1:00 PM.  Contact information: [place] [address] [phone]        Celene Squibb, MD Follow up in 1 week(s).   Specialty:  Internal Medicine Contact information: [place] [address] [phone]          No Known Allergies  Consultations:  None   Procedures/Studies: Dg Chest 2 View  Result Date: 12/24/2017 CLINICAL DATA:  Weakness for 4 days, diabetes mellitus, hypertension EXAM: CHEST - 2 VIEW COMPARISON:  08/07/2015 FINDINGS: Upper normal size of cardiac silhouette. Atherosclerotic calcification aorta. Mediastinal contours and pulmonary vascularity normal. Minimal bronchitic changes and medial bibasilar atelectasis. Lungs otherwise clear. No acute infiltrate, pleural effusion or pneumothorax. RIGHT glenohumeral and BILATERAL AC joint degenerative changes. Scattered endplate spur formation thoracic spine. IMPRESSION: Bronchitic changes with minimal bibasilar atelectasis. Electronically Signed   By: Lavonia Dana M.D.   On: 12/24/2017 13:06     Discharge Exam: Vitals:   12/28/17 0553 12/28/17 1025  BP: 128/62   Pulse:  (!) 132  Resp:    Temp:    SpO2:  97%   Vitals:   12/27/17 2025 12/28/17 0529 12/28/17 0553 12/28/17 1025  BP: (!) 107/59 (!) 85/71 128/62   Pulse: 100 84  (!) 132  Resp: 18 18    Temp: (!) 97.5 F (36.4 C) 97.6 F (36.4 C)    TempSrc: Oral Oral    SpO2: 99% 99%  97%  Weight:   60.1 kg   Height:        General: Pt is alert, awake, not in acute distress Cardiovascular: RRR, S1/S2 +, no rubs, no gallops Respiratory: CTA bilaterally, no wheezing, no rhonchi Abdominal:  Soft, NT, ND, bowel sounds + Extremities: no edema, no cyanosis    The results of significant diagnostics from this hospitalization (including imaging, microbiology, ancillary and laboratory) are listed below for reference.     Microbiology: No results found for this or any previous visit (from the past 240 hour(s)).   Labs: BNP (last 3 results) Recent Labs    12/24/17 1209  BNP 295.6*   Basic Metabolic Panel: Recent Labs  Lab 12/24/17 1209 12/25/17 0638 12/26/17 0602 12/27/17 0450 12/28/17 0459  NA 143 143 143 142 140  K 4.5 4.1 3.6 3.3* 3.8  CL 109 105 98 97* 101  CO2 25 31 35* 33* 30  GLUCOSE 132* 123* 147* 147* 135*  BUN 24* 25* 29* 35* 35*  CREATININE 0.88 0.89 1.10* 1.13* 0.95  CALCIUM 9.2 9.1 9.2 8.9 8.8*  MG  --   --   --   --  2.1   Liver Function Tests: No results for input(s): AST, ALT,  ALKPHOS, BILITOT, PROT, ALBUMIN in the last 168 hours. No results for input(s): LIPASE, AMYLASE in the last 168 hours. No results for input(s): AMMONIA in the last 168 hours. CBC: Recent Labs  Lab 12/24/17 1209  WBC 7.7  NEUTROABS 6.2  HGB 11.6*  HCT 37.3  MCV 94.4  PLT 290   Cardiac Enzymes: Recent Labs  Lab 12/24/17 1209  TROPONINI <0.03   BNP: Invalid input(s): POCBNP CBG: Recent Labs  Lab 12/27/17 1602 12/27/17 2028 12/28/17 0534 12/28/17 0734 12/28/17 1145  GLUCAP 96 233* 125* 144* 125*   D-Dimer No results for input(s): DDIMER in the last 72 hours. Hgb A1c No results for input(s): HGBA1C in the last 72 hours. Lipid Profile No results for input(s): CHOL, HDL, LDLCALC, TRIG, CHOLHDL, LDLDIRECT in the last 72 hours. Thyroid function studies No results for input(s): TSH, T4TOTAL, T3FREE, THYROIDAB in the last 72 hours.  Invalid input(s): FREET3 Anemia work up No results for input(s): VITAMINB12, FOLATE, FERRITIN, TIBC, IRON, RETICCTPCT in the last 72 hours. Urinalysis    Component Value Date/Time   COLORURINE YELLOW 12/24/2017 1209    APPEARANCEUR HAZY (A) 12/24/2017 1209   LABSPEC 1.024 12/24/2017 1209   PHURINE 6.0 12/24/2017 1209   GLUCOSEU NEGATIVE 12/24/2017 1209   HGBUR NEGATIVE 12/24/2017 1209   BILIRUBINUR NEGATIVE 12/24/2017 1209   KETONESUR NEGATIVE 12/24/2017 1209   PROTEINUR NEGATIVE 12/24/2017 1209   NITRITE NEGATIVE 12/24/2017 1209   LEUKOCYTESUR NEGATIVE 12/24/2017 1209   Sepsis Labs Invalid input(s): PROCALCITONIN,  WBC,  LACTICIDVEN Microbiology No results found for this or any previous visit (from the past 240 hour(s)).   Time coordinating discharge: 40 minutes  SIGNED:   Rodena Goldmann, DO [facility] 12/28/2017, 12:42 PM Pager [phone]  If 7PM-7AM, please contact night-coverage [email or web address] Password [email or web address]

## 2017-12-28 NOTE — Progress Notes (Signed)
Pt ambulated on room air and maintained O2 sats 97-99%. Heart rate sustained in the 120s -130s. Pt with no distress.

## 2017-12-28 NOTE — Progress Notes (Signed)
Notified by central telemetry that patients heart rate running between 130's and 150's at rest. Vital signs are stable. Patient denies chest pain or shortness of breath. Dr. Olevia Bowens notified. New orders placed.

## 2017-12-28 NOTE — Care Management (Signed)
DC home today with [place] through [facility]. CM verified this was still DC plan with patient and daughter at bedside. Pt aware HH has 48 hrs to make first visit. Tim, [facility] rep, updated on DC date and will pull orders from chart.

## 2017-12-28 NOTE — Progress Notes (Signed)
Amanda Hale discharged Home per MD order.  Discharge instructions reviewed and discussed with the patient, all questions and concerns answered. Copy of instructions and scripts given to patient.  Allergies as of 12/28/2017   No Known Allergies     Medication List    STOP taking these medications   losartan-hydrochlorothiazide 100-12.5 MG tablet Commonly known as:  HYZAAR     TAKE these medications   ALPRAZolam 0.5 MG tablet Commonly known as:  XANAX Take 0.5 mg by mouth as needed.   amoxicillin 500 MG capsule Commonly known as:  AMOXIL Take 4 capsules by mouth as needed. Takes before going to dentist   apixaban 2.5 MG Tabs tablet Commonly known as:  ELIQUIS Take 1 tablet (2.5 mg total) by mouth 2 (two) times daily.   Casanthranol-Docusate Sodium 30-100 MG Caps Take 1 capsule by mouth as needed.   diltiazem 120 MG 24 hr capsule Commonly known as:  CARDIZEM CD Take 1 capsule (120 mg total) by mouth daily. Start taking on:  12/29/2017   donepezil 5 MG tablet Commonly known as:  ARICEPT Take 10 mg by mouth at bedtime.   HYDROcodone-acetaminophen 5-325 MG tablet Commonly known as:  NORCO/VICODIN Take 1 tablet by mouth every 8 (eight) hours as needed. for pain   meclizine 25 MG tablet Commonly known as:  ANTIVERT Take 25 mg by mouth 4 (four) times daily as needed.   metFORMIN 500 MG tablet Commonly known as:  GLUCOPHAGE Take 500 mg by mouth daily.   metoprolol tartrate 100 MG tablet Commonly known as:  LOPRESSOR Take 1 tablet (100 mg total) by mouth 2 (two) times daily. What changed:    medication strength  how much to take   PRESERVISION AREDS 2 PO Take 1 tablet by mouth 2 (two) times daily.       Patients skin is clean, dry and intact, no evidence of skin break down. IV site discontinued and catheter remains intact. Site without signs and symptoms of complications. Dressing and pressure applied.  Patient escorted to car in a wheelchair,  no distress  noted upon discharge.  Amanda Hale 12/28/2017 4:47 PM

## 2018-01-04 DIAGNOSIS — L98418 Non-pressure chronic ulcer of buttock with other specified severity: Secondary | ICD-10-CM | POA: Diagnosis not present

## 2018-01-04 DIAGNOSIS — I4891 Unspecified atrial fibrillation: Secondary | ICD-10-CM | POA: Diagnosis not present

## 2018-01-04 DIAGNOSIS — M1991 Primary osteoarthritis, unspecified site: Secondary | ICD-10-CM | POA: Diagnosis not present

## 2018-01-04 DIAGNOSIS — I700 Atherosclerosis of aorta: Secondary | ICD-10-CM | POA: Diagnosis not present

## 2018-01-04 DIAGNOSIS — Z7984 Long term (current) use of oral hypoglycemic drugs: Secondary | ICD-10-CM | POA: Diagnosis not present

## 2018-01-04 DIAGNOSIS — Z7901 Long term (current) use of anticoagulants: Secondary | ICD-10-CM | POA: Diagnosis not present

## 2018-01-04 DIAGNOSIS — I110 Hypertensive heart disease with heart failure: Secondary | ICD-10-CM | POA: Diagnosis not present

## 2018-01-04 DIAGNOSIS — M810 Age-related osteoporosis without current pathological fracture: Secondary | ICD-10-CM | POA: Diagnosis not present

## 2018-01-04 DIAGNOSIS — E119 Type 2 diabetes mellitus without complications: Secondary | ICD-10-CM | POA: Diagnosis not present

## 2018-01-04 DIAGNOSIS — I5030 Unspecified diastolic (congestive) heart failure: Secondary | ICD-10-CM | POA: Diagnosis not present

## 2018-01-07 DIAGNOSIS — I5030 Unspecified diastolic (congestive) heart failure: Secondary | ICD-10-CM | POA: Diagnosis not present

## 2018-01-07 DIAGNOSIS — I110 Hypertensive heart disease with heart failure: Secondary | ICD-10-CM | POA: Diagnosis not present

## 2018-01-07 DIAGNOSIS — E119 Type 2 diabetes mellitus without complications: Secondary | ICD-10-CM | POA: Diagnosis not present

## 2018-01-07 DIAGNOSIS — M1991 Primary osteoarthritis, unspecified site: Secondary | ICD-10-CM | POA: Diagnosis not present

## 2018-01-07 DIAGNOSIS — L98418 Non-pressure chronic ulcer of buttock with other specified severity: Secondary | ICD-10-CM | POA: Diagnosis not present

## 2018-01-07 DIAGNOSIS — I4891 Unspecified atrial fibrillation: Secondary | ICD-10-CM | POA: Diagnosis not present

## 2018-01-11 DIAGNOSIS — D519 Vitamin B12 deficiency anemia, unspecified: Secondary | ICD-10-CM | POA: Diagnosis not present

## 2018-01-11 DIAGNOSIS — I10 Essential (primary) hypertension: Secondary | ICD-10-CM | POA: Diagnosis not present

## 2018-01-11 DIAGNOSIS — E782 Mixed hyperlipidemia: Secondary | ICD-10-CM | POA: Diagnosis not present

## 2018-01-11 DIAGNOSIS — M6281 Muscle weakness (generalized): Secondary | ICD-10-CM | POA: Diagnosis not present

## 2018-01-11 DIAGNOSIS — I4891 Unspecified atrial fibrillation: Secondary | ICD-10-CM | POA: Diagnosis not present

## 2018-01-11 DIAGNOSIS — Z6826 Body mass index (BMI) 26.0-26.9, adult: Secondary | ICD-10-CM | POA: Diagnosis not present

## 2018-01-11 DIAGNOSIS — R4181 Age-related cognitive decline: Secondary | ICD-10-CM | POA: Diagnosis not present

## 2018-01-11 DIAGNOSIS — E1122 Type 2 diabetes mellitus with diabetic chronic kidney disease: Secondary | ICD-10-CM | POA: Diagnosis not present

## 2018-01-11 DIAGNOSIS — I5030 Unspecified diastolic (congestive) heart failure: Secondary | ICD-10-CM | POA: Diagnosis not present

## 2018-01-11 DIAGNOSIS — E46 Unspecified protein-calorie malnutrition: Secondary | ICD-10-CM | POA: Diagnosis not present

## 2018-01-11 DIAGNOSIS — G894 Chronic pain syndrome: Secondary | ICD-10-CM | POA: Diagnosis not present

## 2018-01-11 DIAGNOSIS — I129 Hypertensive chronic kidney disease with stage 1 through stage 4 chronic kidney disease, or unspecified chronic kidney disease: Secondary | ICD-10-CM | POA: Diagnosis not present

## 2018-01-11 DIAGNOSIS — F0390 Unspecified dementia without behavioral disturbance: Secondary | ICD-10-CM | POA: Diagnosis not present

## 2018-01-11 DIAGNOSIS — K219 Gastro-esophageal reflux disease without esophagitis: Secondary | ICD-10-CM | POA: Diagnosis not present

## 2018-01-11 DIAGNOSIS — F411 Generalized anxiety disorder: Secondary | ICD-10-CM | POA: Diagnosis not present

## 2018-01-11 DIAGNOSIS — E559 Vitamin D deficiency, unspecified: Secondary | ICD-10-CM | POA: Diagnosis not present

## 2018-01-11 DIAGNOSIS — R600 Localized edema: Secondary | ICD-10-CM | POA: Diagnosis not present

## 2018-01-12 DIAGNOSIS — I5030 Unspecified diastolic (congestive) heart failure: Secondary | ICD-10-CM | POA: Diagnosis not present

## 2018-01-12 DIAGNOSIS — E119 Type 2 diabetes mellitus without complications: Secondary | ICD-10-CM | POA: Diagnosis not present

## 2018-01-12 DIAGNOSIS — I4891 Unspecified atrial fibrillation: Secondary | ICD-10-CM | POA: Diagnosis not present

## 2018-01-12 DIAGNOSIS — L98418 Non-pressure chronic ulcer of buttock with other specified severity: Secondary | ICD-10-CM | POA: Diagnosis not present

## 2018-01-12 DIAGNOSIS — I110 Hypertensive heart disease with heart failure: Secondary | ICD-10-CM | POA: Diagnosis not present

## 2018-01-12 DIAGNOSIS — M1991 Primary osteoarthritis, unspecified site: Secondary | ICD-10-CM | POA: Diagnosis not present

## 2018-01-14 DIAGNOSIS — M1991 Primary osteoarthritis, unspecified site: Secondary | ICD-10-CM | POA: Diagnosis not present

## 2018-01-14 DIAGNOSIS — E119 Type 2 diabetes mellitus without complications: Secondary | ICD-10-CM | POA: Diagnosis not present

## 2018-01-14 DIAGNOSIS — I5030 Unspecified diastolic (congestive) heart failure: Secondary | ICD-10-CM | POA: Diagnosis not present

## 2018-01-14 DIAGNOSIS — L98418 Non-pressure chronic ulcer of buttock with other specified severity: Secondary | ICD-10-CM | POA: Diagnosis not present

## 2018-01-14 DIAGNOSIS — I4891 Unspecified atrial fibrillation: Secondary | ICD-10-CM | POA: Diagnosis not present

## 2018-01-14 DIAGNOSIS — I110 Hypertensive heart disease with heart failure: Secondary | ICD-10-CM | POA: Diagnosis not present

## 2018-01-19 DIAGNOSIS — L98418 Non-pressure chronic ulcer of buttock with other specified severity: Secondary | ICD-10-CM | POA: Diagnosis not present

## 2018-01-19 DIAGNOSIS — I5030 Unspecified diastolic (congestive) heart failure: Secondary | ICD-10-CM | POA: Diagnosis not present

## 2018-01-19 DIAGNOSIS — I4891 Unspecified atrial fibrillation: Secondary | ICD-10-CM | POA: Diagnosis not present

## 2018-01-19 DIAGNOSIS — M1991 Primary osteoarthritis, unspecified site: Secondary | ICD-10-CM | POA: Diagnosis not present

## 2018-01-19 DIAGNOSIS — I110 Hypertensive heart disease with heart failure: Secondary | ICD-10-CM | POA: Diagnosis not present

## 2018-01-19 DIAGNOSIS — E119 Type 2 diabetes mellitus without complications: Secondary | ICD-10-CM | POA: Diagnosis not present

## 2018-01-19 NOTE — Progress Notes (Signed)
Cardiology Office Note    Date:  01/24/2018   ID:  Amanda Hale, DOB Dec 02, 1925, MRN [id number]  PCP:  Celene Squibb, MD  Cardiologist: Carlyle Dolly, MD  No chief complaint on file.   History of Present Illness:  Amanda Hale is a 84 y.o. female history of atypical chest pain felt possibly GI related, CT negative for PE 2017, atrial fibrillation/flutter with RVR 10/2017 treated with diltiazem and Eliquis for CHA2DS2-VASc of 5.  Last seen by Dr. Harl Bowie 12/08/2017 and was in normal sinus rhythm.  He stopped diltiazem and increase metoprolol to 50 mg twice daily.  She did have first-degree AV block.  Hyzaar was stopped and losartan was started.  Blood pressures were running high so metoprolol increased to 75 mg twice daily.  Patient was discharged from the hospital 12/28/2017 after admission with atrial fibrillation with RVR and decompensated CHF.  Metoprolol increased to 100 mg twice daily and diltiazem 120 mg daily added.  Losartan HCTZ was stopped.  2D echo normal LVEF 50 to 55%.  Last night she felt her heart racing. Daughter took BP 138/67 and HR 75. She took 1/2 of xanax and she calmed down. It's happened twice since she's been home.  The daughter that brought her here today says her other sister is very nervous with tremors and makes her mother.  Home health is measuring her for fluid and been stable.  Following a low salt diet.  Past Medical History:  Diagnosis Date  . Arthritis   . DM type 2 (diabetes mellitus, type 2) ([place])   . Dyslipidemia   . Hiatal hernia 2014  . HTN (hypertension)   . Osteoporosis   . Reflux esophagitis 2014   erosive  . Schatzki's ring 2014  . Tubular adenoma     Past Surgical History:  Procedure Laterality Date  . ABDOMINAL HYSTERECTOMY    . APPENDECTOMY    . BREAST BIOPSY    . COLONOSCOPY N/A 09/08/2012   RMR: tubular adenoma, poor prep, needs surveillance April 2015   . COLONOSCOPY  1990/1992/1996   [facility], multiple  hyperplastic polyps  . ESOPHAGOGASTRODUODENOSCOPY N/A 10/09/2015   RMR: pyloric stenosis dilated with scope.  . ESOPHAGOGASTRODUODENOSCOPY (EGD) WITH ESOPHAGEAL DILATION N/A 09/08/2012   RMR: Schatzki's ring s/p 54- F dilation, erosive esophagitis, hiatal hernia, gastric/bulb erosions with mild pyloric stenosis. Negative path  . EYE SURGERY     both  . HIP SURGERY     right x 2  . RECTAL SURGERY     prolapsed rectum/hemorrhoids    Current Medications: Current Meds  Medication Sig  . ALPRAZolam (XANAX) 0.5 MG tablet Take 0.5 mg by mouth as needed.   Marland Kitchen amoxicillin (AMOXIL) 500 MG capsule Take 4 capsules by mouth as needed. Takes before going to dentist  . apixaban (ELIQUIS) 2.5 MG TABS tablet Take 1 tablet (2.5 mg total) by mouth 2 (two) times daily.  Marland Kitchen aspirin EC 81 MG tablet Take 81 mg by mouth daily.  [name] Sodium 30-100 MG CAPS Take 1 capsule by mouth as needed.  . diltiazem (CARDIZEM CD) 120 MG 24 hr capsule Take 1 capsule (120 mg total) by mouth daily.  Marland Kitchen donepezil (ARICEPT) 5 MG tablet Take 10 mg by mouth at bedtime.   Marland Kitchen HYDROcodone-acetaminophen (NORCO/VICODIN) 5-325 MG tablet Take 1 tablet by mouth every 8 (eight) hours as needed. for pain  . meclizine (ANTIVERT) 25 MG tablet Take 25 mg by mouth 4 (four) times daily  as needed.  . metFORMIN (GLUCOPHAGE) 500 MG tablet Take 500 mg by mouth daily.  . metoprolol tartrate (LOPRESSOR) 100 MG tablet Take 1 tablet (100 mg total) by mouth 2 (two) times daily.  . Multiple Vitamins-Minerals (PRESERVISION AREDS 2 PO) Take 1 tablet by mouth 2 (two) times daily.     Allergies:   Patient has no known allergies.   Social History   Socioeconomic History  . Marital status: Divorced    Spouse name: Not on file  . Number of children: Not on file  . Years of education: Not on file  . Highest education level: Not on file  Occupational History  . Not on file  Social Needs  . Financial resource strain: Not on file  . Food  insecurity:    Worry: Not on file    Inability: Not on file  . Transportation needs:    Medical: Not on file    Non-medical: Not on file  Tobacco Use  . Smoking status: Never Smoker  . Smokeless tobacco: Never Used  Substance and Sexual Activity  . Alcohol use: No  . Drug use: No  . Sexual activity: Never  Lifestyle  . Physical activity:    Days per week: Not on file    Minutes per session: Not on file  . Stress: Not on file  Relationships  . Social connections:    Talks on phone: Not on file    Gets together: Not on file    Attends religious service: Not on file    Active member of club or organization: Not on file    Attends meetings of clubs or organizations: Not on file    Relationship status: Not on file  Other Topics Concern  . Not on file  Social History Narrative  . Not on file     Family History:  The patient's family history includes Hypertension in her mother and son.   ROS:   Please see the history of present illness.    Review of Systems  Constitution: Negative.  HENT: Negative.   Eyes: Negative.   Cardiovascular: Positive for palpitations.  Respiratory: Negative.   Hematologic/Lymphatic: Negative.   Musculoskeletal: Negative.  Negative for joint pain.  Gastrointestinal: Negative.   Genitourinary: Negative.   Neurological: Negative.   Psychiatric/Behavioral: The patient is nervous/anxious.    All other systems reviewed and are negative.   PHYSICAL EXAM:   VS:  BP 128/68 (BP Location: Right Arm)   Pulse 98   Ht 4\' 11"  (1.499 m)   Wt 137 lb (62.1 kg)   SpO2 99%   BMI 27.67 kg/m   Physical Exam  GEN: Well nourished, well developed, in no acute distress, looks younger than stated age Neck: no JVD, carotid bruits, or masses Cardiac:RRR; 2/6 systolic murmur  Respiratory:  clear to auscultation bilaterally, normal work of breathing GI: soft, nontender, nondistended, + BS Ext: trace of edema,without cyanosis, clubbing, or  Good distal pulses  bilaterally Neuro:  Alert and Oriented x 3 Psych: euthymic mood, full affect  Wt Readings from Last 3 Encounters:  01/24/18 137 lb (62.1 kg)  12/28/17 132 lb 7.9 oz (60.1 kg)  12/08/17 135 lb (61.2 kg)      Studies/Labs Reviewed:   EKG:  EKG is ordered today.  The ekg ordered today demonstrates atrial fibrillation with controlled ventricular rate  Recent Labs: 12/24/2017: B Natriuretic Peptide 468.0; Hemoglobin 11.6; Platelets 290 12/25/2017: TSH 1.685 12/28/2017: BUN 35; Creatinine, Ser 0.95; Magnesium 2.1; Potassium 3.8;  Sodium 140   Lipid Panel No results found for: CHOL, TRIG, HDL, CHOLHDL, VLDL, LDLCALC, LDLDIRECT  Additional studies/ records that were reviewed today include:  2D echo 12/25/2017 study Conclusions   - Left ventricle: The cavity size was normal. Wall thickness was   normal. Systolic function was normal. The estimated ejection   fraction was in the range of 50% to 55%. Wall motion was normal;   there were no regional wall motion abnormalities. - Aortic valve: Mildly calcified annulus. Trileaflet; mildly   thickened leaflets. Valve area (VTI): 0.87 cm^2. Valve area   (Vmax): 0.97 cm^2. Valve area (Vmean): 0.88 cm^2. - Mitral valve: Mildly calcified annulus. Mildly thickened leaflets   . - Left atrium: The atrium was mildly dilated. - Right atrium: The atrium was mildly dilated. - Atrial septum: No defect or patent foramen ovale was identified.      ASSESSMENT:    1. Atrial fibrillation with RVR ([place])   2. Chronic diastolic heart failure ([place])   3. Essential hypertension      PLAN:  In order of problems listed above:  Atrial fibrillation with RVR rate controlled  on increase metoprolol 100 mg twice daily and diltiazem 120 mg daily also on Eliquis for CHA2DS2-VASc of 5.  Remains in atrial fibrillation with rate control.  Has had some palpitations but heart rate has been stable according to her daughter when she checks it.  Could take an extra half  metoprolol as needed for heart rate over 110.  Follow-up with Dr. Harl Bowie in December or sooner if needed.  Chronic diastolic CHF was acute in the setting of rapid A. fib 2D echo 12/25/2017 showed normal LVEF-currently compensated and in the heart failure monitoring program at home.  No longer on a diuretic.  Essential hypertension losartan HCTZ stopped-blood pressure stable on current meds.    Medication Adjustments/Labs and Tests Ordered: Current medicines are reviewed at length with the patient today.  Concerns regarding medicines are outlined above.  Medication changes, Labs and Tests ordered today are listed in the Patient Instructions below. There are no Patient Instructions on file for this visit.   Signed, Ermalinda Barrios, PA-C  01/24/2018 1:18 PM    [facility] [address], [place], [place]  [address] Phone: [phone]; Fax: [phone]

## 2018-01-21 DIAGNOSIS — L98418 Non-pressure chronic ulcer of buttock with other specified severity: Secondary | ICD-10-CM | POA: Diagnosis not present

## 2018-01-21 DIAGNOSIS — E119 Type 2 diabetes mellitus without complications: Secondary | ICD-10-CM | POA: Diagnosis not present

## 2018-01-21 DIAGNOSIS — M1991 Primary osteoarthritis, unspecified site: Secondary | ICD-10-CM | POA: Diagnosis not present

## 2018-01-21 DIAGNOSIS — I110 Hypertensive heart disease with heart failure: Secondary | ICD-10-CM | POA: Diagnosis not present

## 2018-01-21 DIAGNOSIS — I5030 Unspecified diastolic (congestive) heart failure: Secondary | ICD-10-CM | POA: Diagnosis not present

## 2018-01-21 DIAGNOSIS — I4891 Unspecified atrial fibrillation: Secondary | ICD-10-CM | POA: Diagnosis not present

## 2018-01-24 ENCOUNTER — Encounter: Payer: Self-pay | Admitting: Physician Assistant

## 2018-01-24 ENCOUNTER — Ambulatory Visit (INDEPENDENT_AMBULATORY_CARE_PROVIDER_SITE_OTHER): Payer: Medicare Other | Admitting: Physician Assistant

## 2018-01-24 VITALS — BP 128/68 | HR 98 | Ht 59.0 in | Wt 137.0 lb

## 2018-01-24 DIAGNOSIS — I10 Essential (primary) hypertension: Secondary | ICD-10-CM

## 2018-01-24 DIAGNOSIS — I4891 Unspecified atrial fibrillation: Secondary | ICD-10-CM

## 2018-01-24 DIAGNOSIS — I5032 Chronic diastolic (congestive) heart failure: Secondary | ICD-10-CM

## 2018-01-24 MED ORDER — APIXABAN 2.5 MG PO TABS: 2.5000 mg | ORAL_TABLET | Freq: Two times a day (BID) | ORAL | 3 refills | Status: DC

## 2018-01-24 MED ORDER — METOPROLOL TARTRATE 100 MG PO TABS: 100.0000 mg | ORAL_TABLET | Freq: Two times a day (BID) | ORAL | 3 refills | Status: DC

## 2018-01-24 MED ORDER — DILTIAZEM HCL ER COATED BEADS 120 MG PO CP24: 120.0000 mg | ORAL_CAPSULE | Freq: Every day | ORAL | 3 refills | Status: DC

## 2018-01-24 NOTE — Patient Instructions (Addendum)
Medication Instructions:  Your physician recommends that you continue on your current medications as directed. Please refer to the Current Medication list given to you today. May take and extra 1/2 tablet of Lopressor  for heart rate above 110.   Labwork: NONE   Testing/Procedures: NONE   Follow-Up: Your physician recommends that you schedule a follow-up appointment in: December with Dr. Harl Bowie    Any Other Special Instructions Will Be Listed Below (If Applicable).     If you need a refill on your cardiac medications before your next appointment, please call your pharmacy.  Thank you for choosing [facility]!

## 2018-01-25 DIAGNOSIS — I5030 Unspecified diastolic (congestive) heart failure: Secondary | ICD-10-CM | POA: Diagnosis not present

## 2018-01-25 DIAGNOSIS — L98418 Non-pressure chronic ulcer of buttock with other specified severity: Secondary | ICD-10-CM | POA: Diagnosis not present

## 2018-01-25 DIAGNOSIS — I4891 Unspecified atrial fibrillation: Secondary | ICD-10-CM | POA: Diagnosis not present

## 2018-01-25 DIAGNOSIS — E119 Type 2 diabetes mellitus without complications: Secondary | ICD-10-CM | POA: Diagnosis not present

## 2018-01-25 DIAGNOSIS — M1991 Primary osteoarthritis, unspecified site: Secondary | ICD-10-CM | POA: Diagnosis not present

## 2018-01-25 DIAGNOSIS — I110 Hypertensive heart disease with heart failure: Secondary | ICD-10-CM | POA: Diagnosis not present

## 2018-01-28 DIAGNOSIS — I4891 Unspecified atrial fibrillation: Secondary | ICD-10-CM | POA: Diagnosis not present

## 2018-01-28 DIAGNOSIS — E119 Type 2 diabetes mellitus without complications: Secondary | ICD-10-CM | POA: Diagnosis not present

## 2018-01-28 DIAGNOSIS — I110 Hypertensive heart disease with heart failure: Secondary | ICD-10-CM | POA: Diagnosis not present

## 2018-01-28 DIAGNOSIS — L98418 Non-pressure chronic ulcer of buttock with other specified severity: Secondary | ICD-10-CM | POA: Diagnosis not present

## 2018-01-28 DIAGNOSIS — I5030 Unspecified diastolic (congestive) heart failure: Secondary | ICD-10-CM | POA: Diagnosis not present

## 2018-01-28 DIAGNOSIS — M1991 Primary osteoarthritis, unspecified site: Secondary | ICD-10-CM | POA: Diagnosis not present

## 2018-02-02 DIAGNOSIS — E119 Type 2 diabetes mellitus without complications: Secondary | ICD-10-CM | POA: Diagnosis not present

## 2018-02-02 DIAGNOSIS — I4891 Unspecified atrial fibrillation: Secondary | ICD-10-CM | POA: Diagnosis not present

## 2018-02-02 DIAGNOSIS — M1991 Primary osteoarthritis, unspecified site: Secondary | ICD-10-CM | POA: Diagnosis not present

## 2018-02-02 DIAGNOSIS — I5030 Unspecified diastolic (congestive) heart failure: Secondary | ICD-10-CM | POA: Diagnosis not present

## 2018-02-02 DIAGNOSIS — I110 Hypertensive heart disease with heart failure: Secondary | ICD-10-CM | POA: Diagnosis not present

## 2018-02-02 DIAGNOSIS — L98418 Non-pressure chronic ulcer of buttock with other specified severity: Secondary | ICD-10-CM | POA: Diagnosis not present

## 2018-02-04 DIAGNOSIS — M1991 Primary osteoarthritis, unspecified site: Secondary | ICD-10-CM | POA: Diagnosis not present

## 2018-02-04 DIAGNOSIS — I110 Hypertensive heart disease with heart failure: Secondary | ICD-10-CM | POA: Diagnosis not present

## 2018-02-04 DIAGNOSIS — L98418 Non-pressure chronic ulcer of buttock with other specified severity: Secondary | ICD-10-CM | POA: Diagnosis not present

## 2018-02-04 DIAGNOSIS — I4891 Unspecified atrial fibrillation: Secondary | ICD-10-CM | POA: Diagnosis not present

## 2018-02-04 DIAGNOSIS — I5030 Unspecified diastolic (congestive) heart failure: Secondary | ICD-10-CM | POA: Diagnosis not present

## 2018-02-04 DIAGNOSIS — E119 Type 2 diabetes mellitus without complications: Secondary | ICD-10-CM | POA: Diagnosis not present

## 2018-02-08 DIAGNOSIS — E119 Type 2 diabetes mellitus without complications: Secondary | ICD-10-CM | POA: Diagnosis not present

## 2018-02-08 DIAGNOSIS — I4891 Unspecified atrial fibrillation: Secondary | ICD-10-CM | POA: Diagnosis not present

## 2018-02-08 DIAGNOSIS — L98418 Non-pressure chronic ulcer of buttock with other specified severity: Secondary | ICD-10-CM | POA: Diagnosis not present

## 2018-02-08 DIAGNOSIS — I110 Hypertensive heart disease with heart failure: Secondary | ICD-10-CM | POA: Diagnosis not present

## 2018-02-08 DIAGNOSIS — M1991 Primary osteoarthritis, unspecified site: Secondary | ICD-10-CM | POA: Diagnosis not present

## 2018-02-08 DIAGNOSIS — I5030 Unspecified diastolic (congestive) heart failure: Secondary | ICD-10-CM | POA: Diagnosis not present

## 2018-02-09 DIAGNOSIS — I5030 Unspecified diastolic (congestive) heart failure: Secondary | ICD-10-CM | POA: Diagnosis not present

## 2018-02-09 DIAGNOSIS — I110 Hypertensive heart disease with heart failure: Secondary | ICD-10-CM | POA: Diagnosis not present

## 2018-02-09 DIAGNOSIS — L98418 Non-pressure chronic ulcer of buttock with other specified severity: Secondary | ICD-10-CM | POA: Diagnosis not present

## 2018-02-09 DIAGNOSIS — E119 Type 2 diabetes mellitus without complications: Secondary | ICD-10-CM | POA: Diagnosis not present

## 2018-02-09 DIAGNOSIS — I4891 Unspecified atrial fibrillation: Secondary | ICD-10-CM | POA: Diagnosis not present

## 2018-02-09 DIAGNOSIS — M1991 Primary osteoarthritis, unspecified site: Secondary | ICD-10-CM | POA: Diagnosis not present

## 2018-02-11 DIAGNOSIS — L98418 Non-pressure chronic ulcer of buttock with other specified severity: Secondary | ICD-10-CM | POA: Diagnosis not present

## 2018-02-11 DIAGNOSIS — I4891 Unspecified atrial fibrillation: Secondary | ICD-10-CM | POA: Diagnosis not present

## 2018-02-11 DIAGNOSIS — I110 Hypertensive heart disease with heart failure: Secondary | ICD-10-CM | POA: Diagnosis not present

## 2018-02-11 DIAGNOSIS — M1991 Primary osteoarthritis, unspecified site: Secondary | ICD-10-CM | POA: Diagnosis not present

## 2018-02-11 DIAGNOSIS — I5030 Unspecified diastolic (congestive) heart failure: Secondary | ICD-10-CM | POA: Diagnosis not present

## 2018-02-11 DIAGNOSIS — E119 Type 2 diabetes mellitus without complications: Secondary | ICD-10-CM | POA: Diagnosis not present

## 2018-02-14 ENCOUNTER — Ambulatory Visit: Payer: Medicare Other | Admitting: Gastroenterology

## 2018-02-14 DIAGNOSIS — I10 Essential (primary) hypertension: Secondary | ICD-10-CM | POA: Diagnosis not present

## 2018-02-14 DIAGNOSIS — E1122 Type 2 diabetes mellitus with diabetic chronic kidney disease: Secondary | ICD-10-CM | POA: Diagnosis not present

## 2018-02-14 DIAGNOSIS — D519 Vitamin B12 deficiency anemia, unspecified: Secondary | ICD-10-CM | POA: Diagnosis not present

## 2018-02-14 DIAGNOSIS — E782 Mixed hyperlipidemia: Secondary | ICD-10-CM | POA: Diagnosis not present

## 2018-02-14 DIAGNOSIS — E559 Vitamin D deficiency, unspecified: Secondary | ICD-10-CM | POA: Diagnosis not present

## 2018-02-16 DIAGNOSIS — I4891 Unspecified atrial fibrillation: Secondary | ICD-10-CM | POA: Diagnosis not present

## 2018-02-16 DIAGNOSIS — I110 Hypertensive heart disease with heart failure: Secondary | ICD-10-CM | POA: Diagnosis not present

## 2018-02-16 DIAGNOSIS — I5030 Unspecified diastolic (congestive) heart failure: Secondary | ICD-10-CM | POA: Diagnosis not present

## 2018-02-16 DIAGNOSIS — E119 Type 2 diabetes mellitus without complications: Secondary | ICD-10-CM | POA: Diagnosis not present

## 2018-02-16 DIAGNOSIS — L98418 Non-pressure chronic ulcer of buttock with other specified severity: Secondary | ICD-10-CM | POA: Diagnosis not present

## 2018-02-16 DIAGNOSIS — M1991 Primary osteoarthritis, unspecified site: Secondary | ICD-10-CM | POA: Diagnosis not present

## 2018-02-17 DIAGNOSIS — I5030 Unspecified diastolic (congestive) heart failure: Secondary | ICD-10-CM | POA: Diagnosis not present

## 2018-02-17 DIAGNOSIS — E119 Type 2 diabetes mellitus without complications: Secondary | ICD-10-CM | POA: Diagnosis not present

## 2018-02-17 DIAGNOSIS — I4891 Unspecified atrial fibrillation: Secondary | ICD-10-CM | POA: Diagnosis not present

## 2018-02-17 DIAGNOSIS — M1991 Primary osteoarthritis, unspecified site: Secondary | ICD-10-CM | POA: Diagnosis not present

## 2018-02-17 DIAGNOSIS — I110 Hypertensive heart disease with heart failure: Secondary | ICD-10-CM | POA: Diagnosis not present

## 2018-02-17 DIAGNOSIS — L98418 Non-pressure chronic ulcer of buttock with other specified severity: Secondary | ICD-10-CM | POA: Diagnosis not present

## 2018-02-18 DIAGNOSIS — H3530 Unspecified macular degeneration: Secondary | ICD-10-CM | POA: Diagnosis not present

## 2018-02-18 DIAGNOSIS — E1122 Type 2 diabetes mellitus with diabetic chronic kidney disease: Secondary | ICD-10-CM | POA: Diagnosis not present

## 2018-02-18 DIAGNOSIS — M25569 Pain in unspecified knee: Secondary | ICD-10-CM | POA: Diagnosis not present

## 2018-02-18 DIAGNOSIS — I129 Hypertensive chronic kidney disease with stage 1 through stage 4 chronic kidney disease, or unspecified chronic kidney disease: Secondary | ICD-10-CM | POA: Diagnosis not present

## 2018-02-18 DIAGNOSIS — F411 Generalized anxiety disorder: Secondary | ICD-10-CM | POA: Diagnosis not present

## 2018-02-18 DIAGNOSIS — I480 Paroxysmal atrial fibrillation: Secondary | ICD-10-CM | POA: Diagnosis not present

## 2018-02-18 DIAGNOSIS — E46 Unspecified protein-calorie malnutrition: Secondary | ICD-10-CM | POA: Diagnosis not present

## 2018-02-18 DIAGNOSIS — E782 Mixed hyperlipidemia: Secondary | ICD-10-CM | POA: Diagnosis not present

## 2018-02-18 DIAGNOSIS — G894 Chronic pain syndrome: Secondary | ICD-10-CM | POA: Diagnosis not present

## 2018-02-18 DIAGNOSIS — R945 Abnormal results of liver function studies: Secondary | ICD-10-CM | POA: Diagnosis not present

## 2018-02-18 DIAGNOSIS — N183 Chronic kidney disease, stage 3 (moderate): Secondary | ICD-10-CM | POA: Diagnosis not present

## 2018-02-18 DIAGNOSIS — F0390 Unspecified dementia without behavioral disturbance: Secondary | ICD-10-CM | POA: Diagnosis not present

## 2018-02-22 DIAGNOSIS — I110 Hypertensive heart disease with heart failure: Secondary | ICD-10-CM | POA: Diagnosis not present

## 2018-02-22 DIAGNOSIS — E119 Type 2 diabetes mellitus without complications: Secondary | ICD-10-CM | POA: Diagnosis not present

## 2018-02-22 DIAGNOSIS — L98418 Non-pressure chronic ulcer of buttock with other specified severity: Secondary | ICD-10-CM | POA: Diagnosis not present

## 2018-02-22 DIAGNOSIS — I5030 Unspecified diastolic (congestive) heart failure: Secondary | ICD-10-CM | POA: Diagnosis not present

## 2018-02-22 DIAGNOSIS — M1991 Primary osteoarthritis, unspecified site: Secondary | ICD-10-CM | POA: Diagnosis not present

## 2018-02-22 DIAGNOSIS — I4891 Unspecified atrial fibrillation: Secondary | ICD-10-CM | POA: Diagnosis not present

## 2018-02-23 ENCOUNTER — Ambulatory Visit (INDEPENDENT_AMBULATORY_CARE_PROVIDER_SITE_OTHER): Payer: Medicare Other | Admitting: Gastroenterology

## 2018-02-23 ENCOUNTER — Other Ambulatory Visit: Payer: Self-pay | Admitting: *Deleted

## 2018-02-23 ENCOUNTER — Encounter: Payer: Self-pay | Admitting: Gastroenterology

## 2018-02-23 VITALS — BP 137/68 | HR 72 | Temp 97.5°F | Ht 59.0 in | Wt 138.8 lb

## 2018-02-23 DIAGNOSIS — R1312 Dysphagia, oropharyngeal phase: Secondary | ICD-10-CM

## 2018-02-23 DIAGNOSIS — R1310 Dysphagia, unspecified: Secondary | ICD-10-CM | POA: Insufficient documentation

## 2018-02-23 DIAGNOSIS — R197 Diarrhea, unspecified: Secondary | ICD-10-CM | POA: Diagnosis not present

## 2018-02-23 NOTE — Patient Instructions (Signed)
1. Please collect stool and take to lab. We will contact you with results as available.  2. Speech therapy swallowing evaluation as scheduled.

## 2018-02-23 NOTE — Progress Notes (Signed)
Primary Care Physician: Celene Squibb, MD  Primary Gastroenterologist:  Garfield Cornea, MD   Chief Complaint  Patient presents with  . Dysphagia    gets strangled    HPI: Amanda Hale is a 84 y.o. female here for follow-up.  Last seen in December.  She no showed her June appointment weight loss and abdominal pain.  She has a history of intermittent nausea/vomiting associated with dizziness, improved with meclizine.  Limits use due to drowsiness.  Overall her weight has been good.  Back in December 2018 she was 128 pounds, now she is back up to 138 pounds which is near her baseline the past 2 years.  5 years ago she weighed 190 pounds.  Labs from February 14, 2018 with normal hemoglobin 12.3, creatinine 1.03, LFTs normal except for AST of 49, ALT 39.  Albumin 4.0.  A1c 6.2.  Patient presents today with complaints of difficulty swallowing liquids.  Gets strangled when taking liquids.  She states she does not use a straw.  Food seems to go down okay.  She takes her pills with applesauce.  She has intermittent loose stools, not every day.  Seems to be related to when she drinks boost.  May be worse the last 6 to 8 weeks.  Takes antidiarrheal when needed but not every day.  No melena or rectal bleeding.  Last antibiotic use was prior to dental procedure approximately 6 weeks ago, was given for amoxicillin.   2014 she had a modified barium swallow study "Clinical impression: Oropharyngeal swallow is essentially [facility] with the exception of pt having a difficult time propelling pills posteriorly (this is baseline per patient), mild decreased velopharyngeal closure. No penetration or aspiration was observed. Pt with trace amount of residuals in vallecular space which clear with repeat swallow. Pt continues to report globus sensation in cervical region, however no objective correlate found, I spoke with the patient and her daughter at length in regards to results of MBSS and possibilities for her  symtpoms. Her daughter reports that pt wakes up gasping for breath during the night and the pt has no recollection of this. I wonder whether symptoms are related to reflux (she has been taking Dexilant for about a month once per day per pt). Reflux precautions were reviewed with pt/family. She currently does not elevate HOB, but sleeps on two pillows. Consider changing reflux meds to BID and elevating HOB. Pt had a sleep study over 5 years ago and perhaps she needs another given night time waking per daughter."   Colonoscopy in 2014, tubular adenoma, poor bowel prep.  Never had one year follow-up surveillance colonoscopy, decided against at the time due to other issues and her age.  Last EGD 2017 with pyloric stenosis status post dilation.  In 2014 she has Schatzki ring status post dilation.  Current Outpatient Medications  Medication Sig Dispense Refill  . ALPRAZolam (XANAX) 0.5 MG tablet Take 0.5 mg by mouth as needed.     Marland Kitchen amoxicillin (AMOXIL) 500 MG capsule Take 4 capsules by mouth as needed. Takes before going to dentist  0  . apixaban (ELIQUIS) 2.5 MG TABS tablet Take 1 tablet (2.5 mg total) by mouth 2 (two) times daily. 180 tablet 3  . aspirin EC 81 MG tablet Take 81 mg by mouth daily.    [name] Sodium 30-100 MG CAPS Take 1 capsule by mouth as needed.    . diltiazem (CARDIZEM CD) 120 MG 24 hr capsule Take 1  capsule (120 mg total) by mouth daily. 180 capsule 3  . donepezil (ARICEPT) 5 MG tablet Take 10 mg by mouth at bedtime.     Marland Kitchen HYDROcodone-acetaminophen (NORCO/VICODIN) 5-325 MG tablet Take 1 tablet by mouth every 8 (eight) hours as needed. for pain  0  . meclizine (ANTIVERT) 25 MG tablet Take 25 mg by mouth 4 (four) times daily as needed.    . metFORMIN (GLUCOPHAGE) 500 MG tablet Take 500 mg by mouth 2 (two) times daily.   2  . metoprolol tartrate (LOPRESSOR) 100 MG tablet Take 1 tablet (100 mg total) by mouth 2 (two) times daily. 180 tablet 3  . Multiple  Vitamins-Minerals (PRESERVISION AREDS 2 PO) Take 1 tablet by mouth 2 (two) times daily.     No current facility-administered medications for this visit.     Allergies as of 02/23/2018  . (No Known Allergies)    ROS:  General: Negative for anorexia, weight loss, fever, chills, fatigue, weakness. ENT: Negative for hoarseness,  nasal congestion.  See HPI CV: Negative for chest pain, angina, palpitations, dyspnea on exertion, peripheral edema.  Respiratory: Negative for dyspnea at rest, dyspnea on exertion, cough, sputum, wheezing.  GI: See history of present illness. GU:  Negative for dysuria, hematuria, urinary incontinence, urinary frequency, nocturnal urination.  Endo: Negative for unusual weight change.    Physical Examination:   BP 137/68   Pulse 72   Temp (!) 97.5 F (36.4 C) (Oral)   Ht 4\' 11"  (1.499 m)   Wt 138 lb 12.8 oz (63 kg)   BMI 28.03 kg/m   General: Well-nourished, well-developed in no acute distress.  Eyes: No icterus. Mouth: Oropharyngeal mucosa moist and pink , no lesions erythema or exudate. Lungs: Clear to auscultation bilaterally.  Heart: Regular rate and rhythm, no murmurs rubs or gallops.  Abdomen: Bowel sounds are normal, nontender, nondistended, no hepatosplenomegaly or masses, no abdominal bruits or hernia , no rebound or guarding.   Extremities: No lower extremity edema. No clubbing or deformities. Neuro: Alert and oriented x 4   Skin: Warm and dry, no jaundice.   Psych: Alert and cooperative, normal mood and affect.  Labs:  Lab Results  Component Value Date   CREATININE 0.95 12/28/2017   BUN 35 (H) 12/28/2017   NA 140 12/28/2017   K 3.8 12/28/2017   CL 101 12/28/2017   CO2 30 12/28/2017   Lab Results  Component Value Date   ALT 9 07/07/2012   AST 18 07/07/2012   ALKPHOS 75 07/07/2012   BILITOT 0.6 07/07/2012   Lab Results  Component Value Date   WBC 7.7 12/24/2017   HGB 11.6 (L) 12/24/2017   HCT 37.3 12/24/2017   MCV 94.4  12/24/2017   PLT 290 12/24/2017    Imaging Studies: No results found.

## 2018-02-24 ENCOUNTER — Other Ambulatory Visit (HOSPITAL_COMMUNITY): Payer: Self-pay | Admitting: Specialist

## 2018-02-24 DIAGNOSIS — R1319 Other dysphagia: Secondary | ICD-10-CM

## 2018-02-24 DIAGNOSIS — I5030 Unspecified diastolic (congestive) heart failure: Secondary | ICD-10-CM | POA: Diagnosis not present

## 2018-02-24 DIAGNOSIS — L98418 Non-pressure chronic ulcer of buttock with other specified severity: Secondary | ICD-10-CM | POA: Diagnosis not present

## 2018-02-24 DIAGNOSIS — E119 Type 2 diabetes mellitus without complications: Secondary | ICD-10-CM | POA: Diagnosis not present

## 2018-02-24 DIAGNOSIS — I110 Hypertensive heart disease with heart failure: Secondary | ICD-10-CM | POA: Diagnosis not present

## 2018-02-24 DIAGNOSIS — M1991 Primary osteoarthritis, unspecified site: Secondary | ICD-10-CM | POA: Diagnosis not present

## 2018-02-24 DIAGNOSIS — I4891 Unspecified atrial fibrillation: Secondary | ICD-10-CM | POA: Diagnosis not present

## 2018-02-25 DIAGNOSIS — M1991 Primary osteoarthritis, unspecified site: Secondary | ICD-10-CM | POA: Diagnosis not present

## 2018-02-25 DIAGNOSIS — I110 Hypertensive heart disease with heart failure: Secondary | ICD-10-CM | POA: Diagnosis not present

## 2018-02-25 DIAGNOSIS — L98418 Non-pressure chronic ulcer of buttock with other specified severity: Secondary | ICD-10-CM | POA: Diagnosis not present

## 2018-02-25 DIAGNOSIS — I4891 Unspecified atrial fibrillation: Secondary | ICD-10-CM | POA: Diagnosis not present

## 2018-02-25 DIAGNOSIS — E119 Type 2 diabetes mellitus without complications: Secondary | ICD-10-CM | POA: Diagnosis not present

## 2018-02-25 DIAGNOSIS — I5030 Unspecified diastolic (congestive) heart failure: Secondary | ICD-10-CM | POA: Diagnosis not present

## 2018-02-28 DIAGNOSIS — E119 Type 2 diabetes mellitus without complications: Secondary | ICD-10-CM | POA: Diagnosis not present

## 2018-02-28 DIAGNOSIS — I110 Hypertensive heart disease with heart failure: Secondary | ICD-10-CM | POA: Diagnosis not present

## 2018-02-28 DIAGNOSIS — L98418 Non-pressure chronic ulcer of buttock with other specified severity: Secondary | ICD-10-CM | POA: Diagnosis not present

## 2018-02-28 DIAGNOSIS — I5030 Unspecified diastolic (congestive) heart failure: Secondary | ICD-10-CM | POA: Diagnosis not present

## 2018-02-28 DIAGNOSIS — M1991 Primary osteoarthritis, unspecified site: Secondary | ICD-10-CM | POA: Diagnosis not present

## 2018-02-28 DIAGNOSIS — I4891 Unspecified atrial fibrillation: Secondary | ICD-10-CM | POA: Diagnosis not present

## 2018-02-28 NOTE — Assessment & Plan Note (Signed)
84 year old female presenting with progressive complaints of difficulty swallowing liquids, strangling.  No problems with swallowing pills in applesauce or solid foods.  Although she has a history of pyloric stenosis and Schatzki ring in the past both requiring dilation, her symptoms are not consistent with esophageal stricture or pyloric stenosis.  Would encourage patient for reevaluation with speech therapy, modified barium swallow study. Currently not on PPI, denies heartburn.

## 2018-02-28 NOTE — Progress Notes (Signed)
cc'd to pcp 

## 2018-02-28 NOTE — Assessment & Plan Note (Signed)
Chronic intermittent diarrhea, progressive over the last 1 to 2 months.  Would consider checking for an infectious source given change in symptoms, rule out C. difficile given recent antibiotics.  If stool studies are negative, would encourage her to be more proactive with antidiarrheal medication as needed.

## 2018-03-02 ENCOUNTER — Ambulatory Visit (HOSPITAL_COMMUNITY)
Admission: RE | Admit: 2018-03-02 | Discharge: 2018-03-02 | Disposition: A | Discharge status: Home / Self Care | Payer: Medicare Other | Source: Ambulatory Visit | Attending: Internal Medicine | Admitting: Internal Medicine

## 2018-03-02 ENCOUNTER — Ambulatory Visit (HOSPITAL_COMMUNITY): Payer: Medicare Other | Attending: Internal Medicine | Admitting: Speech Pathology

## 2018-03-02 ENCOUNTER — Other Ambulatory Visit: Payer: Self-pay

## 2018-03-02 ENCOUNTER — Encounter (HOSPITAL_COMMUNITY): Payer: Self-pay | Admitting: Speech Pathology

## 2018-03-02 DIAGNOSIS — R1314 Dysphagia, pharyngoesophageal phase: Secondary | ICD-10-CM | POA: Diagnosis not present

## 2018-03-02 DIAGNOSIS — R197 Diarrhea, unspecified: Secondary | ICD-10-CM | POA: Diagnosis not present

## 2018-03-02 DIAGNOSIS — R1310 Dysphagia, unspecified: Secondary | ICD-10-CM | POA: Diagnosis not present

## 2018-03-02 DIAGNOSIS — R1319 Other dysphagia: Secondary | ICD-10-CM | POA: Insufficient documentation

## 2018-03-02 DIAGNOSIS — R05 Cough: Secondary | ICD-10-CM | POA: Diagnosis not present

## 2018-03-02 DIAGNOSIS — K589 Irritable bowel syndrome without diarrhea: Secondary | ICD-10-CM | POA: Diagnosis not present

## 2018-03-02 NOTE — Therapy (Signed)
[place], [place], [address] Phone: [phone]   Fax:  [phone]  Modified Barium Swallow  Patient Details  Name: Amanda Hale MRN: [id number] Date of Birth: 1926-01-21 No data recorded  Encounter Date: 03/02/2018  End of Session - 03/02/18 1454    Visit Number  1    Number of Visits  1    Authorization Type  Medicare    SLP Start Time  1330    SLP Stop Time   7893    SLP Time Calculation (min)  24 min    Activity Tolerance  Patient tolerated treatment well       Past Medical History:  Diagnosis Date  . Arthritis   . DM type 2 (diabetes mellitus, type 2) ([place])   . Dyslipidemia   . Hiatal hernia 2014  . HTN (hypertension)   . Osteoporosis   . Reflux esophagitis 2014   erosive  . Schatzki's ring 2014  . Tubular adenoma     Past Surgical History:  Procedure Laterality Date  . ABDOMINAL HYSTERECTOMY    . APPENDECTOMY    . BREAST BIOPSY    . COLONOSCOPY N/A 09/08/2012   RMR: tubular adenoma, poor prep, needs surveillance April 2015   . COLONOSCOPY  1990/1992/1996   [facility], multiple hyperplastic polyps  . ESOPHAGOGASTRODUODENOSCOPY N/A 10/09/2015   RMR: pyloric stenosis dilated with scope.  . ESOPHAGOGASTRODUODENOSCOPY (EGD) WITH ESOPHAGEAL DILATION N/A 09/08/2012   RMR: Schatzki's ring s/p 54- F dilation, erosive esophagitis, hiatal hernia, gastric/bulb erosions with mild pyloric stenosis. Negative path  . EYE SURGERY     both  . HIP SURGERY     right x 2  . RECTAL SURGERY     prolapsed rectum/hemorrhoids    There were no vitals filed for this visit.  Subjective Assessment - 03/02/18 1449    Subjective  "I get choked on water."    Patient is accompained by:  Family member    Special Tests  MBSS    Currently in Pain?  No/denies          General - 03/02/18 1450      General Information   Date of Onset  02/28/18    HPI  Amanda Hale is a 84 yo female who was  referred by Neil Crouch for MBSS due to Pt with c/o difficulty swallowing liquids with reported gurgling noises noted. She does not report difficulty swallowing solids and she takes her pills whole in puree. She has a history of pyloric stenosis and Schatzki ring in the past requiring dilation. She had MBS completed in 2014 which was essentially normal. Pt's daughter accompanied her to today's appointment and voices that she has not noticed her mom to cough with liquids.    Type of Study  MBS-Modified Barium Swallow Study    Previous Swallow Assessment  MBS 2014 reg/thin    Diet Prior to this Study  Regular;Thin liquids    Temperature Spikes Noted  No    Respiratory Status  Room air    History of Recent Intubation  No    Behavior/Cognition  Alert;Cooperative;Pleasant mood    Oral Cavity Assessment  Within Functional Limits    Oral Care Completed by SLP  No    Oral Cavity - Dentition  Adequate natural dentition    Vision  Functional for self feeding    Self-Feeding Abilities  Able to feed self    Patient Positioning  Upright in  chair    Baseline Vocal Quality  Normal    Volitional Cough  Strong    Volitional Swallow  Able to elicit    Anatomy  Within functional limits    Pharyngeal Secretions  Not observed secondary MBS         Oral Preparation/Oral Phase - 03/02/18 1452      Oral Preparation/Oral Phase   Oral Phase  Within functional limits      Electrical stimulation - Oral Phase   Was Electrical Stimulation Used  No       Pharyngeal Phase - 03/02/18 1452      Pharyngeal Phase   Pharyngeal Phase  Within functional limits      Electrical Stimulation - Pharyngeal Phase   Was Electrical Stimulation Used  No       Cricopharyngeal Phase - 03/02/18 1452      Cervical Esophageal Phase   Cervical Esophageal Phase  Impaired      Cervical Esophageal Phase - Thin   Thin Cup  Esophageal backflow into the pharynx      Cervical Esophageal Phase - Comment   Cervical Esophageal  Comment  intermittent retrograde peristalsis of thin liquids from cervical esophagus back into hypopharynx noted               Plan - 03/02/18 1455    Clinical Impression Statement  Oropharyngeal swallow is WNL as evidenced by prompt oral and pharyngeal phase, no penetration/aspiration, or significant residuals noted. Suspect mild pharyngoesophageal dysphagia with intermittent retrograde peristalsis of thin barium contrast from upper cervical esophagus back into hypopharynx and pyriforms. When this occurred, the Pt stated that she could feel gurgling and it replicated her symptoms. She was cued to complete a dry swallow, which cleared residuals. This occurred in a slightly greater amount when she was asked to task large, sequential cup sips of thin barium. Recommend regular textures and thin liquids, po meds whole in puree per pt's preference and Pt to take small, cup sips of liquids (no sequential swallows). Pt reassured that her oropharyngeal swallow was essentially WNL for age, however she reports that she often has to cough up and spit out residuals. She is currently not taking anything for reflux and denies "heartburn". This study was reviewed with the Pt and her daughter. No further SLP f/u indicated at this time.     Consulted and Agree with Plan of Care  Patient;Family member/caregiver    Family Member Consulted  daughter       Patient will benefit from skilled therapeutic intervention in order to improve the following deficits and impairments:   Dysphagia, pharyngoesophageal phase    Recommendations/Treatment - 03/02/18 1453      Swallow Evaluation Recommendations   SLP Diet Recommendations  Thin;Age appropriate regular    Liquid Administration via  Cup    Medication Administration  Whole meds with puree    Supervision  Patient able to self feed    Compensations  Small sips/bites    Postural Changes  Seated upright at 90 degrees;Remain upright for at least 30 minutes after  feeds/meals       Prognosis - 03/02/18 1454      Prognosis   Prognosis for Safe Diet Advancement  Good      Individuals Consulted   Consulted and Agree with Results and Recommendations  Patient;Family member/caregiver    Report Sent to   Referring physician       Problem List Patient Active Problem List   Diagnosis Date  Noted  . Dysphagia 02/23/2018  . Diarrhea 02/23/2018  . Pressure injury of skin 12/27/2017  . Atrial fibrillation with RVR ([place]) 12/24/2017  . Chronic diastolic heart failure ([place]) 12/24/2017  . Periumbilical discomfort 61/53/7943  . Nausea with vomiting 05/05/2017  . Pyloric stenosis, acquired 11/06/2015  . Congenital hypertrophic pyloric stenosis   . Upper abdominal pain 09/13/2015  . Loss of weight 09/13/2015  . GERD (gastroesophageal reflux disease) 09/13/2015  . Esophageal motility disorder 02/21/2013  . Cough 02/21/2013  . Esophageal dysphagia 08/11/2012  . Chronic constipation 08/11/2012  . Personal history of colonic polyps 08/11/2012  . Chest pain 07/27/2012  . HTN (hypertension) 07/26/2012  . Diabetes mellitus, type II ([place]) 07/26/2012  . Dyslipidemia 07/26/2012   Thank you,  Genene Churn, [place]  [place] 03/02/2018, 3:01 PM  [facility] [address] [place], [place], [address] Phone: [phone]   Fax:  [phone]  Name: Amanda Hale MRN: [id number] Date of Birth: 05-10-1926

## 2018-03-03 DIAGNOSIS — E119 Type 2 diabetes mellitus without complications: Secondary | ICD-10-CM | POA: Diagnosis not present

## 2018-03-03 DIAGNOSIS — I4891 Unspecified atrial fibrillation: Secondary | ICD-10-CM | POA: Diagnosis not present

## 2018-03-03 DIAGNOSIS — I110 Hypertensive heart disease with heart failure: Secondary | ICD-10-CM | POA: Diagnosis not present

## 2018-03-03 DIAGNOSIS — L98418 Non-pressure chronic ulcer of buttock with other specified severity: Secondary | ICD-10-CM | POA: Diagnosis not present

## 2018-03-03 DIAGNOSIS — I5030 Unspecified diastolic (congestive) heart failure: Secondary | ICD-10-CM | POA: Diagnosis not present

## 2018-03-03 DIAGNOSIS — M1991 Primary osteoarthritis, unspecified site: Secondary | ICD-10-CM | POA: Diagnosis not present

## 2018-03-03 LAB — GASTROINTESTINAL PATHOGEN PANEL PCR
C. difficile Tox A/B, PCR: NOT DETECTED
CRYPTOSPORIDIUM, PCR: NOT DETECTED
Campylobacter, PCR: NOT DETECTED
E COLI (ETEC) LT/ST, PCR: NOT DETECTED
E COLI (STEC) STX1/STX2, PCR: NOT DETECTED
E coli 0157, PCR: NOT DETECTED
Giardia lamblia, PCR: NOT DETECTED
NOROVIRUS, PCR: NOT DETECTED
Rotavirus A, PCR: NOT DETECTED
SALMONELLA, PCR: NOT DETECTED
Shigella, PCR: NOT DETECTED

## 2018-03-03 LAB — C. DIFFICILE GDH AND TOXIN A/B
GDH ANTIGEN: NOT DETECTED
MICRO NUMBER:: 91246173
SPECIMEN QUALITY: ADEQUATE
TOXIN A AND B: NOT DETECTED

## 2018-03-04 DIAGNOSIS — I5030 Unspecified diastolic (congestive) heart failure: Secondary | ICD-10-CM | POA: Diagnosis not present

## 2018-03-04 DIAGNOSIS — L98418 Non-pressure chronic ulcer of buttock with other specified severity: Secondary | ICD-10-CM | POA: Diagnosis not present

## 2018-03-04 DIAGNOSIS — I4891 Unspecified atrial fibrillation: Secondary | ICD-10-CM | POA: Diagnosis not present

## 2018-03-04 DIAGNOSIS — I110 Hypertensive heart disease with heart failure: Secondary | ICD-10-CM | POA: Diagnosis not present

## 2018-03-04 DIAGNOSIS — M1991 Primary osteoarthritis, unspecified site: Secondary | ICD-10-CM | POA: Diagnosis not present

## 2018-03-04 DIAGNOSIS — E119 Type 2 diabetes mellitus without complications: Secondary | ICD-10-CM | POA: Diagnosis not present

## 2018-03-05 DIAGNOSIS — M810 Age-related osteoporosis without current pathological fracture: Secondary | ICD-10-CM | POA: Diagnosis not present

## 2018-03-05 DIAGNOSIS — Z7901 Long term (current) use of anticoagulants: Secondary | ICD-10-CM | POA: Diagnosis not present

## 2018-03-05 DIAGNOSIS — I4891 Unspecified atrial fibrillation: Secondary | ICD-10-CM | POA: Diagnosis not present

## 2018-03-05 DIAGNOSIS — Z9181 History of falling: Secondary | ICD-10-CM | POA: Diagnosis not present

## 2018-03-05 DIAGNOSIS — I700 Atherosclerosis of aorta: Secondary | ICD-10-CM | POA: Diagnosis not present

## 2018-03-05 DIAGNOSIS — Z7984 Long term (current) use of oral hypoglycemic drugs: Secondary | ICD-10-CM | POA: Diagnosis not present

## 2018-03-05 DIAGNOSIS — M1991 Primary osteoarthritis, unspecified site: Secondary | ICD-10-CM | POA: Diagnosis not present

## 2018-03-05 DIAGNOSIS — I5030 Unspecified diastolic (congestive) heart failure: Secondary | ICD-10-CM | POA: Diagnosis not present

## 2018-03-05 DIAGNOSIS — I110 Hypertensive heart disease with heart failure: Secondary | ICD-10-CM | POA: Diagnosis not present

## 2018-03-05 DIAGNOSIS — E119 Type 2 diabetes mellitus without complications: Secondary | ICD-10-CM | POA: Diagnosis not present

## 2018-03-10 DIAGNOSIS — E119 Type 2 diabetes mellitus without complications: Secondary | ICD-10-CM | POA: Diagnosis not present

## 2018-03-10 DIAGNOSIS — I5030 Unspecified diastolic (congestive) heart failure: Secondary | ICD-10-CM | POA: Diagnosis not present

## 2018-03-10 DIAGNOSIS — I4891 Unspecified atrial fibrillation: Secondary | ICD-10-CM | POA: Diagnosis not present

## 2018-03-10 DIAGNOSIS — M810 Age-related osteoporosis without current pathological fracture: Secondary | ICD-10-CM | POA: Diagnosis not present

## 2018-03-10 DIAGNOSIS — I110 Hypertensive heart disease with heart failure: Secondary | ICD-10-CM | POA: Diagnosis not present

## 2018-03-10 DIAGNOSIS — M1991 Primary osteoarthritis, unspecified site: Secondary | ICD-10-CM | POA: Diagnosis not present

## 2018-03-11 DIAGNOSIS — I5030 Unspecified diastolic (congestive) heart failure: Secondary | ICD-10-CM | POA: Diagnosis not present

## 2018-03-11 DIAGNOSIS — E119 Type 2 diabetes mellitus without complications: Secondary | ICD-10-CM | POA: Diagnosis not present

## 2018-03-11 DIAGNOSIS — M810 Age-related osteoporosis without current pathological fracture: Secondary | ICD-10-CM | POA: Diagnosis not present

## 2018-03-11 DIAGNOSIS — I110 Hypertensive heart disease with heart failure: Secondary | ICD-10-CM | POA: Diagnosis not present

## 2018-03-11 DIAGNOSIS — M1991 Primary osteoarthritis, unspecified site: Secondary | ICD-10-CM | POA: Diagnosis not present

## 2018-03-11 DIAGNOSIS — I4891 Unspecified atrial fibrillation: Secondary | ICD-10-CM | POA: Diagnosis not present

## 2018-03-15 DIAGNOSIS — E119 Type 2 diabetes mellitus without complications: Secondary | ICD-10-CM | POA: Diagnosis not present

## 2018-03-15 DIAGNOSIS — L110 Acquired keratosis follicularis: Secondary | ICD-10-CM | POA: Diagnosis not present

## 2018-03-15 DIAGNOSIS — I4891 Unspecified atrial fibrillation: Secondary | ICD-10-CM | POA: Diagnosis not present

## 2018-03-15 DIAGNOSIS — M1991 Primary osteoarthritis, unspecified site: Secondary | ICD-10-CM | POA: Diagnosis not present

## 2018-03-15 DIAGNOSIS — L609 Nail disorder, unspecified: Secondary | ICD-10-CM | POA: Diagnosis not present

## 2018-03-15 DIAGNOSIS — M810 Age-related osteoporosis without current pathological fracture: Secondary | ICD-10-CM | POA: Diagnosis not present

## 2018-03-15 DIAGNOSIS — I5030 Unspecified diastolic (congestive) heart failure: Secondary | ICD-10-CM | POA: Diagnosis not present

## 2018-03-15 DIAGNOSIS — E1151 Type 2 diabetes mellitus with diabetic peripheral angiopathy without gangrene: Secondary | ICD-10-CM | POA: Diagnosis not present

## 2018-03-15 DIAGNOSIS — I110 Hypertensive heart disease with heart failure: Secondary | ICD-10-CM | POA: Diagnosis not present

## 2018-03-17 DIAGNOSIS — M1991 Primary osteoarthritis, unspecified site: Secondary | ICD-10-CM | POA: Diagnosis not present

## 2018-03-17 DIAGNOSIS — I110 Hypertensive heart disease with heart failure: Secondary | ICD-10-CM | POA: Diagnosis not present

## 2018-03-17 DIAGNOSIS — E46 Unspecified protein-calorie malnutrition: Secondary | ICD-10-CM | POA: Diagnosis not present

## 2018-03-17 DIAGNOSIS — M810 Age-related osteoporosis without current pathological fracture: Secondary | ICD-10-CM | POA: Diagnosis not present

## 2018-03-17 DIAGNOSIS — I5030 Unspecified diastolic (congestive) heart failure: Secondary | ICD-10-CM | POA: Diagnosis not present

## 2018-03-17 DIAGNOSIS — E119 Type 2 diabetes mellitus without complications: Secondary | ICD-10-CM | POA: Diagnosis not present

## 2018-03-17 DIAGNOSIS — I10 Essential (primary) hypertension: Secondary | ICD-10-CM | POA: Diagnosis not present

## 2018-03-17 DIAGNOSIS — H3530 Unspecified macular degeneration: Secondary | ICD-10-CM | POA: Diagnosis not present

## 2018-03-17 DIAGNOSIS — I4891 Unspecified atrial fibrillation: Secondary | ICD-10-CM | POA: Diagnosis not present

## 2018-03-17 DIAGNOSIS — H814 Vertigo of central origin: Secondary | ICD-10-CM | POA: Diagnosis not present

## 2018-03-17 DIAGNOSIS — E1122 Type 2 diabetes mellitus with diabetic chronic kidney disease: Secondary | ICD-10-CM | POA: Diagnosis not present

## 2018-03-17 DIAGNOSIS — R197 Diarrhea, unspecified: Secondary | ICD-10-CM | POA: Diagnosis not present

## 2018-03-17 DIAGNOSIS — N183 Chronic kidney disease, stage 3 (moderate): Secondary | ICD-10-CM | POA: Diagnosis not present

## 2018-03-17 DIAGNOSIS — F0390 Unspecified dementia without behavioral disturbance: Secondary | ICD-10-CM | POA: Diagnosis not present

## 2018-03-21 DIAGNOSIS — I4891 Unspecified atrial fibrillation: Secondary | ICD-10-CM | POA: Diagnosis not present

## 2018-03-21 DIAGNOSIS — M1991 Primary osteoarthritis, unspecified site: Secondary | ICD-10-CM | POA: Diagnosis not present

## 2018-03-21 DIAGNOSIS — M810 Age-related osteoporosis without current pathological fracture: Secondary | ICD-10-CM | POA: Diagnosis not present

## 2018-03-21 DIAGNOSIS — I5030 Unspecified diastolic (congestive) heart failure: Secondary | ICD-10-CM | POA: Diagnosis not present

## 2018-03-21 DIAGNOSIS — I110 Hypertensive heart disease with heart failure: Secondary | ICD-10-CM | POA: Diagnosis not present

## 2018-03-21 DIAGNOSIS — E119 Type 2 diabetes mellitus without complications: Secondary | ICD-10-CM | POA: Diagnosis not present

## 2018-03-22 DIAGNOSIS — I5030 Unspecified diastolic (congestive) heart failure: Secondary | ICD-10-CM | POA: Diagnosis not present

## 2018-03-22 DIAGNOSIS — E119 Type 2 diabetes mellitus without complications: Secondary | ICD-10-CM | POA: Diagnosis not present

## 2018-03-22 DIAGNOSIS — M810 Age-related osteoporosis without current pathological fracture: Secondary | ICD-10-CM | POA: Diagnosis not present

## 2018-03-22 DIAGNOSIS — I110 Hypertensive heart disease with heart failure: Secondary | ICD-10-CM | POA: Diagnosis not present

## 2018-03-22 DIAGNOSIS — M1991 Primary osteoarthritis, unspecified site: Secondary | ICD-10-CM | POA: Diagnosis not present

## 2018-03-22 DIAGNOSIS — I4891 Unspecified atrial fibrillation: Secondary | ICD-10-CM | POA: Diagnosis not present

## 2018-03-24 DIAGNOSIS — I110 Hypertensive heart disease with heart failure: Secondary | ICD-10-CM | POA: Diagnosis not present

## 2018-03-24 DIAGNOSIS — M1991 Primary osteoarthritis, unspecified site: Secondary | ICD-10-CM | POA: Diagnosis not present

## 2018-03-24 DIAGNOSIS — M810 Age-related osteoporosis without current pathological fracture: Secondary | ICD-10-CM | POA: Diagnosis not present

## 2018-03-24 DIAGNOSIS — I5030 Unspecified diastolic (congestive) heart failure: Secondary | ICD-10-CM | POA: Diagnosis not present

## 2018-03-24 DIAGNOSIS — E119 Type 2 diabetes mellitus without complications: Secondary | ICD-10-CM | POA: Diagnosis not present

## 2018-03-24 DIAGNOSIS — I4891 Unspecified atrial fibrillation: Secondary | ICD-10-CM | POA: Diagnosis not present

## 2018-03-29 DIAGNOSIS — E119 Type 2 diabetes mellitus without complications: Secondary | ICD-10-CM | POA: Diagnosis not present

## 2018-03-29 DIAGNOSIS — M1991 Primary osteoarthritis, unspecified site: Secondary | ICD-10-CM | POA: Diagnosis not present

## 2018-03-29 DIAGNOSIS — I5030 Unspecified diastolic (congestive) heart failure: Secondary | ICD-10-CM | POA: Diagnosis not present

## 2018-03-29 DIAGNOSIS — M810 Age-related osteoporosis without current pathological fracture: Secondary | ICD-10-CM | POA: Diagnosis not present

## 2018-03-29 DIAGNOSIS — I4891 Unspecified atrial fibrillation: Secondary | ICD-10-CM | POA: Diagnosis not present

## 2018-03-29 DIAGNOSIS — I110 Hypertensive heart disease with heart failure: Secondary | ICD-10-CM | POA: Diagnosis not present

## 2018-04-05 DIAGNOSIS — M810 Age-related osteoporosis without current pathological fracture: Secondary | ICD-10-CM | POA: Diagnosis not present

## 2018-04-05 DIAGNOSIS — I110 Hypertensive heart disease with heart failure: Secondary | ICD-10-CM | POA: Diagnosis not present

## 2018-04-05 DIAGNOSIS — E119 Type 2 diabetes mellitus without complications: Secondary | ICD-10-CM | POA: Diagnosis not present

## 2018-04-05 DIAGNOSIS — I4891 Unspecified atrial fibrillation: Secondary | ICD-10-CM | POA: Diagnosis not present

## 2018-04-05 DIAGNOSIS — I5030 Unspecified diastolic (congestive) heart failure: Secondary | ICD-10-CM | POA: Diagnosis not present

## 2018-04-05 DIAGNOSIS — M1991 Primary osteoarthritis, unspecified site: Secondary | ICD-10-CM | POA: Diagnosis not present

## 2018-04-12 DIAGNOSIS — I110 Hypertensive heart disease with heart failure: Secondary | ICD-10-CM | POA: Diagnosis not present

## 2018-04-12 DIAGNOSIS — M1991 Primary osteoarthritis, unspecified site: Secondary | ICD-10-CM | POA: Diagnosis not present

## 2018-04-12 DIAGNOSIS — I4891 Unspecified atrial fibrillation: Secondary | ICD-10-CM | POA: Diagnosis not present

## 2018-04-12 DIAGNOSIS — E119 Type 2 diabetes mellitus without complications: Secondary | ICD-10-CM | POA: Diagnosis not present

## 2018-04-12 DIAGNOSIS — M810 Age-related osteoporosis without current pathological fracture: Secondary | ICD-10-CM | POA: Diagnosis not present

## 2018-04-12 DIAGNOSIS — I5030 Unspecified diastolic (congestive) heart failure: Secondary | ICD-10-CM | POA: Diagnosis not present

## 2018-04-13 DIAGNOSIS — F0390 Unspecified dementia without behavioral disturbance: Secondary | ICD-10-CM | POA: Diagnosis not present

## 2018-04-13 DIAGNOSIS — E46 Unspecified protein-calorie malnutrition: Secondary | ICD-10-CM | POA: Diagnosis not present

## 2018-04-13 DIAGNOSIS — I10 Essential (primary) hypertension: Secondary | ICD-10-CM | POA: Diagnosis not present

## 2018-04-13 DIAGNOSIS — R634 Abnormal weight loss: Secondary | ICD-10-CM | POA: Diagnosis not present

## 2018-04-19 DIAGNOSIS — I110 Hypertensive heart disease with heart failure: Secondary | ICD-10-CM | POA: Diagnosis not present

## 2018-04-19 DIAGNOSIS — M1991 Primary osteoarthritis, unspecified site: Secondary | ICD-10-CM | POA: Diagnosis not present

## 2018-04-19 DIAGNOSIS — I5030 Unspecified diastolic (congestive) heart failure: Secondary | ICD-10-CM | POA: Diagnosis not present

## 2018-04-19 DIAGNOSIS — E119 Type 2 diabetes mellitus without complications: Secondary | ICD-10-CM | POA: Diagnosis not present

## 2018-04-19 DIAGNOSIS — I4891 Unspecified atrial fibrillation: Secondary | ICD-10-CM | POA: Diagnosis not present

## 2018-04-19 DIAGNOSIS — M810 Age-related osteoporosis without current pathological fracture: Secondary | ICD-10-CM | POA: Diagnosis not present

## 2018-04-21 ENCOUNTER — Encounter: Payer: Self-pay | Admitting: Cardiology

## 2018-04-21 ENCOUNTER — Ambulatory Visit (INDEPENDENT_AMBULATORY_CARE_PROVIDER_SITE_OTHER): Payer: Medicare Other | Admitting: Cardiology

## 2018-04-21 VITALS — BP 115/70 | HR 69 | Ht 59.0 in | Wt 138.0 lb

## 2018-04-21 DIAGNOSIS — I4891 Unspecified atrial fibrillation: Secondary | ICD-10-CM

## 2018-04-21 DIAGNOSIS — I5032 Chronic diastolic (congestive) heart failure: Secondary | ICD-10-CM | POA: Diagnosis not present

## 2018-04-21 MED ORDER — DILTIAZEM HCL ER COATED BEADS 120 MG PO CP24: 120.0000 mg | ORAL_CAPSULE | Freq: Every day | ORAL | 1 refills | Status: DC

## 2018-04-21 MED ORDER — METOPROLOL TARTRATE 100 MG PO TABS: 100.0000 mg | ORAL_TABLET | Freq: Two times a day (BID) | ORAL | 1 refills | Status: DC

## 2018-04-21 MED ORDER — APIXABAN 2.5 MG PO TABS: 2.5000 mg | ORAL_TABLET | Freq: Two times a day (BID) | ORAL | 1 refills | Status: DC

## 2018-04-21 NOTE — Patient Instructions (Signed)

## 2018-04-21 NOTE — Progress Notes (Signed)
Clinical Summary Amanda Hale is a 84 y.o.female seen today for follow up for the following medical problems.     1. Afib - admit 12/2017 with afib with RVR - started on dilt 120mg  daily, lopressor increased to 100mg  bid.   - no recent palpitatoins - compliant with meds. On eliquis 2.5mg  bid.    2. Chronic diastolic HF - occasional LE edema, overall mild   SH: former wife of Amanda Hale, also a patient of mine.    Past Medical History:  Diagnosis Date  . Arthritis   . DM type 2 (diabetes mellitus, type 2) ([place])   . Dyslipidemia   . Hiatal hernia 2014  . HTN (hypertension)   . Osteoporosis   . Reflux esophagitis 2014   erosive  . Schatzki's ring 2014  . Tubular adenoma      No Known Allergies   Current Outpatient Medications  Medication Sig Dispense Refill  . ALPRAZolam (XANAX) 0.5 MG tablet Take 0.5 mg by mouth as needed.     Marland Kitchen amoxicillin (AMOXIL) 500 MG capsule Take 4 capsules by mouth as needed. Takes before going to dentist  0  . apixaban (ELIQUIS) 2.5 MG TABS tablet Take 1 tablet (2.5 mg total) by mouth 2 (two) times daily. 180 tablet 3  . aspirin EC 81 MG tablet Take 81 mg by mouth daily.    Amanda Hale 30-100 MG CAPS Take 1 capsule by mouth as needed.    . diltiazem (CARDIZEM CD) 120 MG 24 hr capsule Take 1 capsule (120 mg total) by mouth daily. 180 capsule 3  . donepezil (ARICEPT) 5 MG tablet Take 10 mg by mouth at bedtime.     Marland Kitchen HYDROcodone-acetaminophen (NORCO/VICODIN) 5-325 MG tablet Take 1 tablet by mouth every 8 (eight) hours as needed. for pain  0  . meclizine (ANTIVERT) 25 MG tablet Take 25 mg by mouth 4 (four) times daily as needed.    . metFORMIN (GLUCOPHAGE) 500 MG tablet Take 500 mg by mouth 2 (two) times daily.   2  . metoprolol tartrate (LOPRESSOR) 100 MG tablet Take 1 tablet (100 mg total) by mouth 2 (two) times daily. 180 tablet 3  . Multiple Vitamins-Minerals (PRESERVISION AREDS 2 PO) Take 1 tablet by mouth 2  (two) times daily.     No current facility-administered medications for this visit.      Past Surgical History:  Procedure Laterality Date  . ABDOMINAL HYSTERECTOMY    . APPENDECTOMY    . BREAST BIOPSY    . COLONOSCOPY N/A 09/08/2012   RMR: tubular adenoma, poor prep, needs surveillance April 2015   . COLONOSCOPY  1990/1992/1996   [facility], multiple hyperplastic polyps  . ESOPHAGOGASTRODUODENOSCOPY N/A 10/09/2015   RMR: pyloric stenosis dilated with scope.  . ESOPHAGOGASTRODUODENOSCOPY (EGD) WITH ESOPHAGEAL DILATION N/A 09/08/2012   RMR: Schatzki's ring s/p 54- F dilation, erosive esophagitis, hiatal hernia, gastric/bulb erosions with mild pyloric stenosis. Negative path  . EYE SURGERY     both  . HIP SURGERY     right x 2  . RECTAL SURGERY     prolapsed rectum/hemorrhoids     No Known Allergies    Family History  Problem Relation Age of Onset  . Hypertension Mother   . Hypertension Son   . Colon cancer Neg Hx      Social History Amanda Hale reports that she has never smoked. She has never used smokeless tobacco. Amanda Hale reports that she does not  drink alcohol.   Review of Systems CONSTITUTIONAL: No weight loss, fever, chills, weakness or fatigue.  HEENT: Eyes: No visual loss, blurred vision, double vision or yellow sclerae.No hearing loss, sneezing, congestion, runny nose or sore throat.  SKIN: No rash or itching.  CARDIOVASCULAR: per hpi RESPIRATORY: No shortness of breath, cough or sputum.  GASTROINTESTINAL: No anorexia, nausea, vomiting or diarrhea. No abdominal pain or blood.  GENITOURINARY: No burning on urination, no polyuria NEUROLOGICAL: No headache, dizziness, syncope, paralysis, ataxia, numbness or tingling in the extremities. No change in bowel or bladder control.  MUSCULOSKELETAL: No muscle, back pain, joint pain or stiffness.  LYMPHATICS: No enlarged nodes. No history of splenectomy.  PSYCHIATRIC: No history of depression or  anxiety.  ENDOCRINOLOGIC: No reports of sweating, cold or heat intolerance. No polyuria or polydipsia.  Marland Kitchen   Physical Examination Today's Vitals   04/21/18 1316  BP: 115/70  Pulse: 69  SpO2: 99%  Weight: 138 lb (62.6 kg)  Height: 4\' 11"  (1.499 m)   Body mass index is 27.87 kg/m.  Gen: resting comfortably, no acute distress HEENT: no scleral icterus, pupils equal round and reactive, no palptable cervical adenopathy,  CV: irreg, no m/r/g no jvd Resp: Clear to auscultation bilaterally GI: abdomen is soft, non-tender, non-distended, normal bowel sounds, no hepatosplenomegaly MSK: extremities are warm, no edema.  Skin: warm, no rash Neuro:  no focal deficits Psych: appropriate affect   Diagnostic Studies 07/2015 echo Study Conclusions  - Left ventricle: The cavity size was normal. Wall thickness was increased in a pattern of mild LVH. Systolic function was normal. The estimated ejection fraction was in the range of 60% to 65%. Wall motion was normal; there were no regional wall motion abnormalities. Doppler parameters are consistent with abnormal left ventricular relaxation (grade 1 diastolic dysfunction). - Aortic valve: Moderately calcified annulus. Mildly thickened, mildly calcified leaflets. - Mitral valve: Calcified annulus. Mildly thickened, mildly calcified leaflets .   12/2017 echo ------------------------------------------------------------------- Study Conclusions  - Left ventricle: The cavity size was normal. Wall thickness was   normal. Systolic function was normal. The estimated ejection   fraction was in the range of 50% to 55%. Wall motion was normal;   there were no regional wall motion abnormalities. - Aortic valve: Mildly calcified annulus. Trileaflet; mildly   thickened leaflets. Valve area (VTI): 0.87 cm^2. Valve area   (Vmax): 0.97 cm^2. Valve area (Vmean): 0.88 cm^2. - Mitral valve: Mildly calcified annulus. Mildly thickened  leaflets   . - Left atrium: The atrium was mildly dilated. - Right atrium: The atrium was mildly dilated. - Atrial septum: No defect or patent foramen ovale was identified.   Assessment and Plan  1. Afib - no recent symptoms, continue rate control - continue eliquis. Right at cut off based on her age of 30 kg for lower dosing of eliquis based on age and weight, continue current dose at this time.   2. Chronic diastolic  - euvolemic without symptoms, continue to monitor, not requiring diuretic.    F/u 6 months  Arnoldo Lenis, M.D.

## 2018-05-06 ENCOUNTER — Telehealth: Payer: Self-pay | Admitting: Cardiology

## 2018-05-06 MED ORDER — FUROSEMIDE 20 MG PO TABS: 20.0000 mg | ORAL_TABLET | Freq: Every day | ORAL | 1 refills | Status: DC | PRN

## 2018-05-06 NOTE — Telephone Encounter (Signed)
Patient informed and verbalized understanding of plan. 

## 2018-05-06 NOTE — Telephone Encounter (Signed)
Start lasix 20mg  prn swelling, update [place] late next week   Zandra Abts MD

## 2018-05-06 NOTE — Telephone Encounter (Signed)
Patient c/o swelling in her legs, feet and up to knees. Says that swelling is worse than at last office visit and that her legs feel heavy. Patient said she is able to put on her shoes.  Weight:  12/20 144  12/19 143.6 12/18 139 12/17 ? No weight 12/16 148 No c/o sob or chest pain. No c/o redness in LE. Patient said she has dizziness all the time. Patient said her legs are always tender to touch. Patient advised that message would be sent to her provider for further advice. Medications reconciled.

## 2018-05-06 NOTE — Telephone Encounter (Signed)
Patient called stating that she is retaining fluid in her feet and legs for approximately a week now.

## 2018-05-31 DIAGNOSIS — E559 Vitamin D deficiency, unspecified: Secondary | ICD-10-CM | POA: Diagnosis not present

## 2018-05-31 DIAGNOSIS — D519 Vitamin B12 deficiency anemia, unspecified: Secondary | ICD-10-CM | POA: Diagnosis not present

## 2018-05-31 DIAGNOSIS — E782 Mixed hyperlipidemia: Secondary | ICD-10-CM | POA: Diagnosis not present

## 2018-05-31 DIAGNOSIS — I10 Essential (primary) hypertension: Secondary | ICD-10-CM | POA: Diagnosis not present

## 2018-05-31 DIAGNOSIS — E119 Type 2 diabetes mellitus without complications: Secondary | ICD-10-CM | POA: Diagnosis not present

## 2018-05-31 DIAGNOSIS — E1169 Type 2 diabetes mellitus with other specified complication: Secondary | ICD-10-CM | POA: Diagnosis not present

## 2018-05-31 DIAGNOSIS — E1122 Type 2 diabetes mellitus with diabetic chronic kidney disease: Secondary | ICD-10-CM | POA: Diagnosis not present

## 2018-06-03 DIAGNOSIS — E782 Mixed hyperlipidemia: Secondary | ICD-10-CM | POA: Diagnosis not present

## 2018-06-03 DIAGNOSIS — N183 Chronic kidney disease, stage 3 (moderate): Secondary | ICD-10-CM | POA: Diagnosis not present

## 2018-06-03 DIAGNOSIS — F0390 Unspecified dementia without behavioral disturbance: Secondary | ICD-10-CM | POA: Diagnosis not present

## 2018-06-03 DIAGNOSIS — E46 Unspecified protein-calorie malnutrition: Secondary | ICD-10-CM | POA: Diagnosis not present

## 2018-06-03 DIAGNOSIS — G894 Chronic pain syndrome: Secondary | ICD-10-CM | POA: Diagnosis not present

## 2018-06-03 DIAGNOSIS — I10 Essential (primary) hypertension: Secondary | ICD-10-CM | POA: Diagnosis not present

## 2018-06-03 DIAGNOSIS — E1122 Type 2 diabetes mellitus with diabetic chronic kidney disease: Secondary | ICD-10-CM | POA: Diagnosis not present

## 2018-06-03 DIAGNOSIS — I480 Paroxysmal atrial fibrillation: Secondary | ICD-10-CM | POA: Diagnosis not present

## 2018-06-03 DIAGNOSIS — M545 Low back pain: Secondary | ICD-10-CM | POA: Diagnosis not present

## 2018-06-03 DIAGNOSIS — M791 Myalgia, unspecified site: Secondary | ICD-10-CM | POA: Diagnosis not present

## 2018-06-03 DIAGNOSIS — R600 Localized edema: Secondary | ICD-10-CM | POA: Diagnosis not present

## 2018-06-03 DIAGNOSIS — F411 Generalized anxiety disorder: Secondary | ICD-10-CM | POA: Diagnosis not present

## 2018-06-14 DIAGNOSIS — L110 Acquired keratosis follicularis: Secondary | ICD-10-CM | POA: Diagnosis not present

## 2018-06-14 DIAGNOSIS — E1151 Type 2 diabetes mellitus with diabetic peripheral angiopathy without gangrene: Secondary | ICD-10-CM | POA: Diagnosis not present

## 2018-06-14 DIAGNOSIS — L609 Nail disorder, unspecified: Secondary | ICD-10-CM | POA: Diagnosis not present

## 2018-06-20 ENCOUNTER — Other Ambulatory Visit (HOSPITAL_COMMUNITY): Payer: Self-pay | Admitting: Specialist

## 2018-06-20 DIAGNOSIS — R1319 Other dysphagia: Secondary | ICD-10-CM

## 2018-06-21 ENCOUNTER — Ambulatory Visit (HOSPITAL_COMMUNITY)
Admission: RE | Admit: 2018-06-21 | Discharge: 2018-06-21 | Disposition: A | Discharge status: Home / Self Care | Payer: Medicare Other | Source: Ambulatory Visit | Attending: Internal Medicine | Admitting: Internal Medicine

## 2018-06-21 ENCOUNTER — Other Ambulatory Visit: Payer: Self-pay

## 2018-06-21 ENCOUNTER — Ambulatory Visit (HOSPITAL_COMMUNITY): Payer: Medicare Other | Attending: Internal Medicine | Admitting: Speech Pathology

## 2018-06-21 ENCOUNTER — Encounter (HOSPITAL_COMMUNITY): Payer: Self-pay | Admitting: Speech Pathology

## 2018-06-21 DIAGNOSIS — R05 Cough: Secondary | ICD-10-CM | POA: Diagnosis not present

## 2018-06-21 DIAGNOSIS — R1319 Other dysphagia: Secondary | ICD-10-CM | POA: Insufficient documentation

## 2018-06-21 DIAGNOSIS — R1314 Dysphagia, pharyngoesophageal phase: Secondary | ICD-10-CM | POA: Insufficient documentation

## 2018-06-21 DIAGNOSIS — R1310 Dysphagia, unspecified: Secondary | ICD-10-CM | POA: Diagnosis not present

## 2018-06-21 NOTE — Therapy (Signed)
[place], [place], [address] Phone: [phone]   Fax:  [phone]  Modified Barium Swallow  Patient Details  Name: Amanda Hale MRN: [id number] Date of Birth: Mar 18, 1926 No data recorded  Encounter Date: 06/21/2018  End of Session - 06/21/18 2202    Visit Number  1    Number of Visits  1    Authorization Type  Medicare    SLP Start Time  4481    SLP Stop Time   8563    SLP Time Calculation (min)  37 min    Activity Tolerance  Patient tolerated treatment well       Past Medical History:  Diagnosis Date  . Arthritis   . DM type 2 (diabetes mellitus, type 2) ([place])   . Dyslipidemia   . Hiatal hernia 2014  . HTN (hypertension)   . Osteoporosis   . Reflux esophagitis 2014   erosive  . Schatzki's ring 2014  . Tubular adenoma     Past Surgical History:  Procedure Laterality Date  . ABDOMINAL HYSTERECTOMY    . APPENDECTOMY    . BREAST BIOPSY    . COLONOSCOPY N/A 09/08/2012   RMR: tubular adenoma, poor prep, needs surveillance April 2015   . COLONOSCOPY  1990/1992/1996   [facility], multiple hyperplastic polyps  . ESOPHAGOGASTRODUODENOSCOPY N/A 10/09/2015   RMR: pyloric stenosis dilated with scope.  . ESOPHAGOGASTRODUODENOSCOPY (EGD) WITH ESOPHAGEAL DILATION N/A 09/08/2012   RMR: Schatzki's ring s/p 54- F dilation, erosive esophagitis, hiatal hernia, gastric/bulb erosions with mild pyloric stenosis. Negative path  . EYE SURGERY     both  . HIP SURGERY     right x 2  . RECTAL SURGERY     prolapsed rectum/hemorrhoids    There were no vitals filed for this visit.  Subjective Assessment - 06/21/18 2153    Subjective  "I cough on water and I feel a gurgling in my throat."    Special Tests  MBSS    Currently in Pain?  No/denies          General - 06/21/18 2153      General Information   Date of Onset  02/15/18    HPI  Ms. Kaley Jutras is a 85 yo female who was referred by Dr.  Edwinna Areola. Hall for MBSS due to h/o dysphagia. Pt is known to this SLP from previous MBSS completed in October 2019 which was normal for oropharyngeal swallow, but showed Intermittent retrograde peristalsis of a small amount of thin liquid contrast from the upper cervical esophagus back into the hypopharynx and piriform sinuses.    Type of Study  MBS-Modified Barium Swallow Study    Previous Swallow Assessment  Oct 2019: oropharyngeal WNL; suspected primary esophageal dysphagia    Diet Prior to this Study  Regular;Thin liquids    Temperature Spikes Noted  No    Respiratory Status  Room air    History of Recent Intubation  No    Behavior/Cognition  Alert;Cooperative;Pleasant mood    Oral Cavity Assessment  Within Functional Limits    Oral Care Completed by SLP  No    Oral Cavity - Dentition  Adequate natural dentition    Vision  Functional for self feeding    Self-Feeding Abilities  Able to feed self    Patient Positioning  Upright in chair    Baseline Vocal Quality  Normal    Volitional Cough  Strong    Volitional  Swallow  Able to elicit    Anatomy  Within functional limits    Pharyngeal Secretions  Not observed secondary MBS         Oral Preparation/Oral Phase - 06/21/18 2157      Oral Preparation/Oral Phase   Oral Phase  Within functional limits      Electrical stimulation - Oral Phase   Was Electrical Stimulation Used  No       Pharyngeal Phase - 06/21/18 2158      Pharyngeal Phase   Pharyngeal Phase  Within functional limits      Pharyngeal - Thin   Pharyngeal- Thin Teaspoon  Within functional limits    Pharyngeal- Thin Cup  Within functional limits    Pharyngeal- Thin Straw  Within functional limits      Pharyngeal - Solids   Pharyngeal- Puree  Within functional limits    Pharyngeal- Regular  Within functional limits    Pharyngeal- Pill  Within functional limits      Electrical Stimulation - Pharyngeal Phase   Was Electrical Stimulation Used  No        Cricopharyngeal Phase - 06/21/18 2158      Cervical Esophageal Phase   Cervical Esophageal Phase  Impaired      Cervical Esophageal Phase - Thin   Thin Straw  Esophageal backflow into cervical esophagus      Cervical Esophageal Phase - Comment   Cervical Esophageal Comment  standing column of barium again noted in thoracic esophagus after all consistencies with some retrograde movement noted; Radiologist noted dysmotility         Plan - 06/21/18 2203    Clinical Impression Statement Pt presents with normal oropharyngeal swallow and suspected primary esophageal dysphagia as confirmed dysmotility noted by radiologist. Swallow trigger was elicited at the level of the valleculae across consistencies and textures, adequate hyolaryngeal excursion, epiglottic deflection, and laryngeal closure without penetration or aspiration or significant residuals post swallow. Esophageal sweep was completed after thin liquids and thoracic esophagus noted to be filled with barium. When Pt presented with sequential straw sips of thin, some retrograde movement noted within esophagus and a tiny amount of contrast from cervical esophagus moved back into pyriform sinuses. As study progressed, Pt noted to orally hold boluses a bit longer (with straw sips thin and barium tablet with thin), likely due to barium filled thoracic esophagus. Oropharyngeal swallow is negatively impacted by esophageal dysmotility. The barium tablet was transiently delayed near GEJ, but did eventually pass.   Recommend that Pt adhere to esophageal dysphagia precautions by eating smaller, more frequent meals while sitting upright and remain upright for 30-60 minutes after, avoid use of straws due to risk of retrograde movement of liquid bolus through UES to pyriforms. Pharyngeal swallowing exercises are not indicated. Pt may wish to follow up with GI, however these results are nearly identical to the MBSS ordered by [name] in October. No further  SLP services indicated at this time. These results and recommendations were reviewed with the Pt and her daughter.      Patient will benefit from skilled therapeutic intervention in order to improve the following deficits and impairments:   Dysphagia, pharyngoesophageal phase    Recommendations/Treatment - 06/21/18 2200      Swallow Evaluation Recommendations   SLP Diet Recommendations  Age appropriate regular;Thin    Liquid Administration via  Cup; Avoid straws due to retrograde movement in cervical esophagus   Medication Administration  Whole meds with liquid    Supervision  Patient able to self feed    Compensations  Slow rate;Small sips/bites   esophageal dysphagia precautions   Postural Changes  Seated upright at 90 degrees;Remain upright for at least 30 minutes after feeds/meals       Prognosis - 06/21/18 2200      Prognosis   Prognosis for Safe Diet Advancement  Good    Barriers to Reach Goals  Severity of deficits    Barriers/Prognosis Comment  Severity of suspected esophageal dyshagia      Individuals Consulted   Consulted and Agree with Results and Recommendations  Patient;MD;Family member/caregiver   radiologist   Family Member Consulted  daughter    Report Sent to   Referring physician       Problem List Patient Active Problem List   Diagnosis Date Noted  . Dysphagia 02/23/2018  . Diarrhea 02/23/2018  . Pressure injury of skin 12/27/2017  . Atrial fibrillation with RVR ([place]) 12/24/2017  . Chronic diastolic heart failure ([place]) 12/24/2017  . Periumbilical discomfort 19/75/8832  . Nausea with vomiting 05/05/2017  . Pyloric stenosis, acquired 11/06/2015  . Congenital hypertrophic pyloric stenosis   . Upper abdominal pain 09/13/2015  . Loss of weight 09/13/2015  . GERD (gastroesophageal reflux disease) 09/13/2015  . Esophageal motility disorder 02/21/2013  . Cough 02/21/2013  . Esophageal dysphagia 08/11/2012  . Chronic constipation 08/11/2012  .  Personal history of colonic polyps 08/11/2012  . Chest pain 07/27/2012  . HTN (hypertension) 07/26/2012  . Diabetes mellitus, type II ([place]) 07/26/2012  . Dyslipidemia 07/26/2012   Thank you,  Genene Churn, [place]  [facility] 06/21/2018, 10:04 PM  [place], [place], [address] Phone: [phone]   Fax:  [phone]  Name: ENORA TRILLO MRN: [id number] Date of Birth: [birth year]

## 2018-07-05 DIAGNOSIS — R52 Pain, unspecified: Secondary | ICD-10-CM | POA: Diagnosis not present

## 2018-07-05 DIAGNOSIS — W19XXXA Unspecified fall, initial encounter: Secondary | ICD-10-CM | POA: Diagnosis not present

## 2018-07-09 DIAGNOSIS — Z7901 Long term (current) use of anticoagulants: Secondary | ICD-10-CM | POA: Diagnosis not present

## 2018-07-09 DIAGNOSIS — R0902 Hypoxemia: Secondary | ICD-10-CM | POA: Diagnosis not present

## 2018-07-09 DIAGNOSIS — S0990XA Unspecified injury of head, initial encounter: Secondary | ICD-10-CM | POA: Diagnosis not present

## 2018-07-09 DIAGNOSIS — R609 Edema, unspecified: Secondary | ICD-10-CM | POA: Diagnosis not present

## 2018-07-09 DIAGNOSIS — S79911A Unspecified injury of right hip, initial encounter: Secondary | ICD-10-CM | POA: Diagnosis not present

## 2018-07-09 DIAGNOSIS — M25561 Pain in right knee: Secondary | ICD-10-CM | POA: Diagnosis not present

## 2018-07-09 DIAGNOSIS — Z96641 Presence of right artificial hip joint: Secondary | ICD-10-CM | POA: Diagnosis not present

## 2018-07-09 DIAGNOSIS — M25551 Pain in right hip: Secondary | ICD-10-CM | POA: Diagnosis not present

## 2018-07-09 DIAGNOSIS — I4891 Unspecified atrial fibrillation: Secondary | ICD-10-CM | POA: Diagnosis not present

## 2018-07-09 DIAGNOSIS — R52 Pain, unspecified: Secondary | ICD-10-CM | POA: Diagnosis not present

## 2018-07-09 DIAGNOSIS — W010XXA Fall on same level from slipping, tripping and stumbling without subsequent striking against object, initial encounter: Secondary | ICD-10-CM | POA: Diagnosis not present

## 2018-07-09 DIAGNOSIS — I10 Essential (primary) hypertension: Secondary | ICD-10-CM | POA: Diagnosis not present

## 2018-07-25 ENCOUNTER — Telehealth: Payer: Self-pay | Admitting: Orthopaedic Surgery

## 2018-07-25 NOTE — Telephone Encounter (Signed)
Patient called to relay that she thinks she needs appointment with Dr Luna Glasgow. States she has had 2 falls recently, and for one of them, she was taken by EMS to emergency room. Said she can't remember which hospital emergency room (possibly [facility], as she knows it is not [facility], but it is in [place] she had hit her head and had a lot of tests for a lot of things.  I relayed I am happy to help her and schedule an appointment, however, I will need some further information.  Also discussed being seen by primary care doctor for some of the issues related to the falls - said will also need to do that.  I asked if anyone was with her; said her daughter, but she is in the bed.  Will call back when daughter can help provide the information needed for the appointment.

## 2018-08-16 DIAGNOSIS — I4891 Unspecified atrial fibrillation: Secondary | ICD-10-CM | POA: Diagnosis not present

## 2018-08-16 DIAGNOSIS — H814 Vertigo of central origin: Secondary | ICD-10-CM | POA: Diagnosis not present

## 2018-08-16 DIAGNOSIS — R600 Localized edema: Secondary | ICD-10-CM | POA: Diagnosis not present

## 2018-08-16 DIAGNOSIS — F0390 Unspecified dementia without behavioral disturbance: Secondary | ICD-10-CM | POA: Diagnosis not present

## 2018-08-16 DIAGNOSIS — I5032 Chronic diastolic (congestive) heart failure: Secondary | ICD-10-CM | POA: Diagnosis not present

## 2018-08-16 DIAGNOSIS — F411 Generalized anxiety disorder: Secondary | ICD-10-CM | POA: Diagnosis not present

## 2018-08-29 ENCOUNTER — Other Ambulatory Visit: Payer: Self-pay | Admitting: Cardiology

## 2018-09-12 DIAGNOSIS — D519 Vitamin B12 deficiency anemia, unspecified: Secondary | ICD-10-CM | POA: Diagnosis not present

## 2018-09-12 DIAGNOSIS — I10 Essential (primary) hypertension: Secondary | ICD-10-CM | POA: Diagnosis not present

## 2018-09-12 DIAGNOSIS — E559 Vitamin D deficiency, unspecified: Secondary | ICD-10-CM | POA: Diagnosis not present

## 2018-09-12 DIAGNOSIS — E1122 Type 2 diabetes mellitus with diabetic chronic kidney disease: Secondary | ICD-10-CM | POA: Diagnosis not present

## 2018-09-12 DIAGNOSIS — E1169 Type 2 diabetes mellitus with other specified complication: Secondary | ICD-10-CM | POA: Diagnosis not present

## 2018-09-16 DIAGNOSIS — R600 Localized edema: Secondary | ICD-10-CM | POA: Diagnosis not present

## 2018-09-16 DIAGNOSIS — N183 Chronic kidney disease, stage 3 (moderate): Secondary | ICD-10-CM | POA: Diagnosis not present

## 2018-09-16 DIAGNOSIS — R1310 Dysphagia, unspecified: Secondary | ICD-10-CM | POA: Diagnosis not present

## 2018-09-16 DIAGNOSIS — I5032 Chronic diastolic (congestive) heart failure: Secondary | ICD-10-CM | POA: Diagnosis not present

## 2018-09-16 DIAGNOSIS — F411 Generalized anxiety disorder: Secondary | ICD-10-CM | POA: Diagnosis not present

## 2018-09-16 DIAGNOSIS — E1122 Type 2 diabetes mellitus with diabetic chronic kidney disease: Secondary | ICD-10-CM | POA: Diagnosis not present

## 2018-09-16 DIAGNOSIS — F0390 Unspecified dementia without behavioral disturbance: Secondary | ICD-10-CM | POA: Diagnosis not present

## 2018-09-16 DIAGNOSIS — H814 Vertigo of central origin: Secondary | ICD-10-CM | POA: Diagnosis not present

## 2018-09-16 DIAGNOSIS — E782 Mixed hyperlipidemia: Secondary | ICD-10-CM | POA: Diagnosis not present

## 2018-09-16 DIAGNOSIS — I4891 Unspecified atrial fibrillation: Secondary | ICD-10-CM | POA: Diagnosis not present

## 2018-09-16 DIAGNOSIS — M545 Low back pain: Secondary | ICD-10-CM | POA: Diagnosis not present

## 2018-09-16 DIAGNOSIS — I10 Essential (primary) hypertension: Secondary | ICD-10-CM | POA: Diagnosis not present

## 2018-09-23 ENCOUNTER — Telehealth: Payer: Self-pay | Admitting: *Deleted

## 2018-09-23 NOTE — Telephone Encounter (Signed)
-----   Message from Herminio Commons, MD sent at 09/22/2018  5:11 PM EDT ----- Lipids are reasonably well controlled for age.

## 2018-09-23 NOTE — Telephone Encounter (Signed)
Patient informed. 

## 2018-10-03 DIAGNOSIS — Z0000 Encounter for general adult medical examination without abnormal findings: Secondary | ICD-10-CM | POA: Diagnosis not present

## 2018-10-05 DIAGNOSIS — R600 Localized edema: Secondary | ICD-10-CM | POA: Diagnosis not present

## 2018-10-05 DIAGNOSIS — F411 Generalized anxiety disorder: Secondary | ICD-10-CM | POA: Diagnosis not present

## 2018-10-05 DIAGNOSIS — I4891 Unspecified atrial fibrillation: Secondary | ICD-10-CM | POA: Diagnosis not present

## 2018-10-05 DIAGNOSIS — I5032 Chronic diastolic (congestive) heart failure: Secondary | ICD-10-CM | POA: Diagnosis not present

## 2018-11-16 DIAGNOSIS — I5032 Chronic diastolic (congestive) heart failure: Secondary | ICD-10-CM | POA: Diagnosis not present

## 2018-11-16 DIAGNOSIS — F411 Generalized anxiety disorder: Secondary | ICD-10-CM | POA: Diagnosis not present

## 2018-11-16 DIAGNOSIS — I4891 Unspecified atrial fibrillation: Secondary | ICD-10-CM | POA: Diagnosis not present

## 2018-11-16 DIAGNOSIS — R600 Localized edema: Secondary | ICD-10-CM | POA: Diagnosis not present

## 2018-11-22 ENCOUNTER — Other Ambulatory Visit: Payer: Self-pay | Admitting: Cardiology

## 2018-11-23 DIAGNOSIS — E1151 Type 2 diabetes mellitus with diabetic peripheral angiopathy without gangrene: Secondary | ICD-10-CM | POA: Diagnosis not present

## 2018-11-23 DIAGNOSIS — L110 Acquired keratosis follicularis: Secondary | ICD-10-CM | POA: Diagnosis not present

## 2018-11-23 DIAGNOSIS — L609 Nail disorder, unspecified: Secondary | ICD-10-CM | POA: Diagnosis not present

## 2018-11-28 ENCOUNTER — Other Ambulatory Visit: Payer: Self-pay | Admitting: *Deleted

## 2018-11-28 MED ORDER — DILTIAZEM HCL ER COATED BEADS 120 MG PO CP24: 120.0000 mg | ORAL_CAPSULE | Freq: Every day | ORAL | 0 refills | Status: DC

## 2018-12-02 ENCOUNTER — Other Ambulatory Visit: Payer: Self-pay | Admitting: Cardiology

## 2019-01-23 ENCOUNTER — Other Ambulatory Visit: Payer: Self-pay | Admitting: Cardiology

## 2019-02-06 ENCOUNTER — Encounter

## 2019-02-06 ENCOUNTER — Ambulatory Visit (INDEPENDENT_AMBULATORY_CARE_PROVIDER_SITE_OTHER): Payer: Medicare Other | Admitting: Cardiology

## 2019-02-06 ENCOUNTER — Encounter: Payer: Self-pay | Admitting: Cardiology

## 2019-02-06 ENCOUNTER — Other Ambulatory Visit: Payer: Self-pay

## 2019-02-06 VITALS — BP 125/61 | HR 70 | Temp 98.3°F | Ht 59.0 in | Wt 134.2 lb

## 2019-02-06 DIAGNOSIS — I5032 Chronic diastolic (congestive) heart failure: Secondary | ICD-10-CM

## 2019-02-06 DIAGNOSIS — I4891 Unspecified atrial fibrillation: Secondary | ICD-10-CM | POA: Diagnosis not present

## 2019-02-06 NOTE — Progress Notes (Signed)
Clinical Summary Amanda Hale is a 85 y.o.female seen today for follow up for the following medical problems.     1. Afib - admit 12/2017 with afib with RVR - started on dilt 120mg  daily, lopressor increased to 100mg  bid.    - isolated episode of palpitations.  - no bleeding eliquis.   2. Chronic diastolic HF - occasional LE edema, overall mild - takes lasix prn, will take about once a month which controls her swelling   3. Vertigo - managed per pcp, has prn meclizine     SH: former wife of Mose Selvidge, also a patient of mine who passed away in Sep 05, 2015   Past Medical History:  Diagnosis Date  . Arthritis   . DM type 2 (diabetes mellitus, type 2) ([place])   . Dyslipidemia   . Hiatal hernia 09/04/12  . HTN (hypertension)   . Osteoporosis   . Reflux esophagitis 09/04/12   erosive  . Schatzki's ring September 04, 2012  . Tubular adenoma      No Known Allergies   Current Outpatient Medications  Medication Sig Dispense Refill  . ALPRAZolam (XANAX) 0.5 MG tablet Take 0.5 mg by mouth as needed.     Marland Kitchen amoxicillin (AMOXIL) 500 MG capsule Take 4 capsules by mouth as needed. Takes before going to dentist  0  . Casanthranol-Docusate Sodium 30-100 MG CAPS Take 1 capsule by mouth as needed.    . diltiazem (CARDIZEM CD) 120 MG 24 hr capsule Take 1 capsule (120 mg total) by mouth daily. 180 capsule 0  . donepezil (ARICEPT) 10 MG tablet Take 10 mg by mouth at bedtime.    Marland Kitchen ELIQUIS 2.5 MG TABS tablet TAKE 1 TABLET BY MOUTH TWICE DAILY 180 tablet 1  . furosemide (LASIX) 20 MG tablet TAKE 1 TABLET BY MOUTH DAILY AS NEEDED FOR swelling 90 tablet 1  . HYDROcodone-acetaminophen (NORCO/VICODIN) 5-325 MG tablet Take 1 tablet by mouth every 8 (eight) hours as needed. for pain  0  . meclizine (ANTIVERT) 25 MG tablet Take 25 mg by mouth 4 (four) times daily as needed.    . memantine (NAMENDA) 5 MG tablet Take 5 mg by mouth 2 (two) times daily.    . metFORMIN (GLUCOPHAGE) 500 MG tablet Take 500 mg by  mouth 2 (two) times daily.   2  . metoprolol tartrate (LOPRESSOR) 100 MG tablet Take 1 tablet (100 mg total) by mouth 2 (two) times daily. 180 tablet 1  . Multiple Vitamins-Minerals (PRESERVISION AREDS 2 PO) Take 1 tablet by mouth 2 (two) times daily.     No current facility-administered medications for this visit.      Past Surgical History:  Procedure Laterality Date  . ABDOMINAL HYSTERECTOMY    . APPENDECTOMY    . BREAST BIOPSY    . COLONOSCOPY N/A 09/08/2012   RMR: tubular adenoma, poor prep, needs surveillance April 2015   . COLONOSCOPY  1990/1992/1996   [facility], multiple hyperplastic polyps  . ESOPHAGOGASTRODUODENOSCOPY N/A 10/09/2015   RMR: pyloric stenosis dilated with scope.  . ESOPHAGOGASTRODUODENOSCOPY (EGD) WITH ESOPHAGEAL DILATION N/A 09/08/2012   RMR: Schatzki's ring s/p Sep 04, 2052- F dilation, erosive esophagitis, hiatal hernia, gastric/bulb erosions with mild pyloric stenosis. Negative path  . EYE SURGERY     both  . HIP SURGERY     right x 2  . RECTAL SURGERY     prolapsed rectum/hemorrhoids     No Known Allergies    Family History  Problem Relation Age of  Onset  . Hypertension Mother   . Hypertension Son   . Colon cancer Neg Hx      Social History Ms. Fosnaugh reports that she has never smoked. She has never used smokeless tobacco. Ms. Ruhe reports no history of alcohol use.   Review of Systems CONSTITUTIONAL: No weight loss, fever, chills, weakness or fatigue.  HEENT: Eyes: No visual loss, blurred vision, double vision or yellow sclerae.No hearing loss, sneezing, congestion, runny nose or sore throat.  SKIN: No rash or itching.  CARDIOVASCULAR: per hpi RESPIRATORY: No shortness of breath, cough or sputum.  GASTROINTESTINAL: No anorexia, nausea, vomiting or diarrhea. No abdominal pain or blood.  GENITOURINARY: No burning on urination, no polyuria NEUROLOGICAL: +vertigo MUSCULOSKELETAL: No muscle, back pain, joint pain or stiffness.   LYMPHATICS: No enlarged nodes. No history of splenectomy.  PSYCHIATRIC: No history of depression or anxiety.  ENDOCRINOLOGIC: No reports of sweating, cold or heat intolerance. No polyuria or polydipsia.  Marland Kitchen   Physical Examination Today's Vitals   02/06/19 1338  BP: 125/61  Pulse: 70  Temp: 98.3 F (36.8 C)  SpO2: 100%  Weight: 134 lb 3.2 oz (60.9 kg)  Height: 4\' 11"  (1.499 m)   Body mass index is 27.11 kg/m.  Gen: resting comfortably, no acute distress HEENT: no scleral icterus, pupils equal round and reactive, no palptable cervical adenopathy,  CV: irreg, no mr/g, no jvd Resp: Clear to auscultation bilaterally GI: abdomen is soft, non-tender, non-distended, normal bowel sounds, no hepatosplenomegaly MSK: extremities are warm, trace bilateral edema Skin: warm, no rash Neuro:  no focal deficits Psych: appropriate affect   Diagnostic Studies 07/2015 echo Study Conclusions  - Left ventricle: The cavity size was normal. Wall thickness was increased in a pattern of mild LVH. Systolic function was normal. The estimated ejection fraction was in the range of 60% to 65%. Wall motion was normal; there were no regional wall motion abnormalities. Doppler parameters are consistent with abnormal left ventricular relaxation (grade 1 diastolic dysfunction). - Aortic valve: Moderately calcified annulus. Mildly thickened, mildly calcified leaflets. - Mitral valve: Calcified annulus. Mildly thickened, mildly calcified leaflets .   12/2017 echo ------------------------------------------------------------------- Study Conclusions  - Left ventricle: The cavity size was normal. Wall thickness was normal. Systolic function was normal. The estimated ejection fraction was in the range of 50% to 55%. Wall motion was normal; there were no regional wall motion abnormalities. - Aortic valve: Mildly calcified annulus. Trileaflet; mildly thickened leaflets. Valve area  (VTI): 0.87 cm^2. Valve area (Vmax): 0.97 cm^2. Valve area (Vmean): 0.88 cm^2. - Mitral valve: Mildly calcified annulus. Mildly thickened leaflets . - Left atrium: The atrium was mildly dilated. - Right atrium: The atrium was mildly dilated. - Atrial septum: No defect or patent foramen ovale was identified.    Assessment and Plan  1. Afib -no symptmos, EKG today afib rate 62 -continue rate control. Weight essentially right at 60 kg, with her age we haved dosed eliquis 2.5 mg bid - continue current meds  2. Chronic diastolic  - continue prn lasix, currently euvolemic   F/u 6 months      Arnoldo Lenis, M.D

## 2019-02-06 NOTE — Patient Instructions (Addendum)

## 2019-02-07 DIAGNOSIS — E559 Vitamin D deficiency, unspecified: Secondary | ICD-10-CM | POA: Diagnosis not present

## 2019-02-07 DIAGNOSIS — E1169 Type 2 diabetes mellitus with other specified complication: Secondary | ICD-10-CM | POA: Diagnosis not present

## 2019-02-07 DIAGNOSIS — D519 Vitamin B12 deficiency anemia, unspecified: Secondary | ICD-10-CM | POA: Diagnosis not present

## 2019-02-07 DIAGNOSIS — I10 Essential (primary) hypertension: Secondary | ICD-10-CM | POA: Diagnosis not present

## 2019-02-07 DIAGNOSIS — E1122 Type 2 diabetes mellitus with diabetic chronic kidney disease: Secondary | ICD-10-CM | POA: Diagnosis not present

## 2019-02-07 DIAGNOSIS — E782 Mixed hyperlipidemia: Secondary | ICD-10-CM | POA: Diagnosis not present

## 2019-02-09 DIAGNOSIS — F0390 Unspecified dementia without behavioral disturbance: Secondary | ICD-10-CM | POA: Diagnosis not present

## 2019-02-09 DIAGNOSIS — R945 Abnormal results of liver function studies: Secondary | ICD-10-CM | POA: Diagnosis not present

## 2019-02-09 DIAGNOSIS — E782 Mixed hyperlipidemia: Secondary | ICD-10-CM | POA: Diagnosis not present

## 2019-02-09 DIAGNOSIS — I5032 Chronic diastolic (congestive) heart failure: Secondary | ICD-10-CM | POA: Diagnosis not present

## 2019-02-09 DIAGNOSIS — N183 Chronic kidney disease, stage 3 (moderate): Secondary | ICD-10-CM | POA: Diagnosis not present

## 2019-02-09 DIAGNOSIS — R1310 Dysphagia, unspecified: Secondary | ICD-10-CM | POA: Diagnosis not present

## 2019-02-09 DIAGNOSIS — E1122 Type 2 diabetes mellitus with diabetic chronic kidney disease: Secondary | ICD-10-CM | POA: Diagnosis not present

## 2019-02-09 DIAGNOSIS — R600 Localized edema: Secondary | ICD-10-CM | POA: Diagnosis not present

## 2019-02-09 DIAGNOSIS — H814 Vertigo of central origin: Secondary | ICD-10-CM | POA: Diagnosis not present

## 2019-02-09 DIAGNOSIS — I4891 Unspecified atrial fibrillation: Secondary | ICD-10-CM | POA: Diagnosis not present

## 2019-02-09 DIAGNOSIS — I10 Essential (primary) hypertension: Secondary | ICD-10-CM | POA: Diagnosis not present

## 2019-02-09 DIAGNOSIS — F411 Generalized anxiety disorder: Secondary | ICD-10-CM | POA: Diagnosis not present

## 2019-02-14 DIAGNOSIS — H353133 Nonexudative age-related macular degeneration, bilateral, advanced atrophic without subfoveal involvement: Secondary | ICD-10-CM | POA: Diagnosis not present

## 2019-02-16 DIAGNOSIS — L609 Nail disorder, unspecified: Secondary | ICD-10-CM | POA: Diagnosis not present

## 2019-02-16 DIAGNOSIS — E1151 Type 2 diabetes mellitus with diabetic peripheral angiopathy without gangrene: Secondary | ICD-10-CM | POA: Diagnosis not present

## 2019-02-16 DIAGNOSIS — L110 Acquired keratosis follicularis: Secondary | ICD-10-CM | POA: Diagnosis not present

## 2019-03-06 DIAGNOSIS — I4891 Unspecified atrial fibrillation: Secondary | ICD-10-CM | POA: Diagnosis not present

## 2019-03-06 DIAGNOSIS — F411 Generalized anxiety disorder: Secondary | ICD-10-CM | POA: Diagnosis not present

## 2019-03-06 DIAGNOSIS — I5032 Chronic diastolic (congestive) heart failure: Secondary | ICD-10-CM | POA: Diagnosis not present

## 2019-03-06 DIAGNOSIS — R600 Localized edema: Secondary | ICD-10-CM | POA: Diagnosis not present

## 2019-03-09 DIAGNOSIS — R269 Unspecified abnormalities of gait and mobility: Secondary | ICD-10-CM | POA: Diagnosis not present

## 2019-03-09 DIAGNOSIS — R600 Localized edema: Secondary | ICD-10-CM | POA: Diagnosis not present

## 2019-03-17 ENCOUNTER — Other Ambulatory Visit: Payer: Self-pay

## 2019-03-17 ENCOUNTER — Encounter (HOSPITAL_COMMUNITY): Payer: Self-pay | Admitting: Emergency Medicine

## 2019-03-17 ENCOUNTER — Inpatient Hospital Stay (HOSPITAL_COMMUNITY)
Admission: EM | Admit: 2019-03-17 | Discharge: 2019-03-20 | DRG: 690 | Disposition: A | Discharge status: Skilled Nursing Facility | Payer: Medicare Other | Attending: Internal Medicine | Admitting: Internal Medicine

## 2019-03-17 ENCOUNTER — Emergency Department (HOSPITAL_COMMUNITY): Payer: Medicare Other

## 2019-03-17 DIAGNOSIS — R609 Edema, unspecified: Secondary | ICD-10-CM

## 2019-03-17 DIAGNOSIS — E860 Dehydration: Secondary | ICD-10-CM | POA: Diagnosis present

## 2019-03-17 DIAGNOSIS — I10 Essential (primary) hypertension: Secondary | ICD-10-CM | POA: Diagnosis present

## 2019-03-17 DIAGNOSIS — F0390 Unspecified dementia without behavioral disturbance: Secondary | ICD-10-CM | POA: Diagnosis present

## 2019-03-17 DIAGNOSIS — Z20828 Contact with and (suspected) exposure to other viral communicable diseases: Secondary | ICD-10-CM | POA: Diagnosis present

## 2019-03-17 DIAGNOSIS — R0902 Hypoxemia: Secondary | ICD-10-CM | POA: Diagnosis not present

## 2019-03-17 DIAGNOSIS — I110 Hypertensive heart disease with heart failure: Secondary | ICD-10-CM | POA: Diagnosis present

## 2019-03-17 DIAGNOSIS — R5381 Other malaise: Secondary | ICD-10-CM | POA: Diagnosis not present

## 2019-03-17 DIAGNOSIS — K219 Gastro-esophageal reflux disease without esophagitis: Secondary | ICD-10-CM | POA: Diagnosis present

## 2019-03-17 DIAGNOSIS — Z8719 Personal history of other diseases of the digestive system: Secondary | ICD-10-CM

## 2019-03-17 DIAGNOSIS — E119 Type 2 diabetes mellitus without complications: Secondary | ICD-10-CM | POA: Diagnosis present

## 2019-03-17 DIAGNOSIS — I5032 Chronic diastolic (congestive) heart failure: Secondary | ICD-10-CM | POA: Diagnosis not present

## 2019-03-17 DIAGNOSIS — N3000 Acute cystitis without hematuria: Secondary | ICD-10-CM | POA: Diagnosis not present

## 2019-03-17 DIAGNOSIS — J189 Pneumonia, unspecified organism: Secondary | ICD-10-CM | POA: Diagnosis not present

## 2019-03-17 DIAGNOSIS — R531 Weakness: Secondary | ICD-10-CM | POA: Diagnosis not present

## 2019-03-17 DIAGNOSIS — R262 Difficulty in walking, not elsewhere classified: Secondary | ICD-10-CM | POA: Diagnosis not present

## 2019-03-17 DIAGNOSIS — Z66 Do not resuscitate: Secondary | ICD-10-CM | POA: Diagnosis present

## 2019-03-17 DIAGNOSIS — J90 Pleural effusion, not elsewhere classified: Secondary | ICD-10-CM | POA: Diagnosis not present

## 2019-03-17 DIAGNOSIS — R9431 Abnormal electrocardiogram [ECG] [EKG]: Secondary | ICD-10-CM | POA: Diagnosis present

## 2019-03-17 DIAGNOSIS — B9620 Unspecified Escherichia coli [E. coli] as the cause of diseases classified elsewhere: Secondary | ICD-10-CM | POA: Diagnosis not present

## 2019-03-17 DIAGNOSIS — I480 Paroxysmal atrial fibrillation: Secondary | ICD-10-CM | POA: Diagnosis present

## 2019-03-17 DIAGNOSIS — Z8249 Family history of ischemic heart disease and other diseases of the circulatory system: Secondary | ICD-10-CM

## 2019-03-17 DIAGNOSIS — R600 Localized edema: Secondary | ICD-10-CM | POA: Diagnosis not present

## 2019-03-17 DIAGNOSIS — J40 Bronchitis, not specified as acute or chronic: Secondary | ICD-10-CM | POA: Diagnosis present

## 2019-03-17 DIAGNOSIS — I4891 Unspecified atrial fibrillation: Secondary | ICD-10-CM | POA: Diagnosis not present

## 2019-03-17 DIAGNOSIS — Z7901 Long term (current) use of anticoagulants: Secondary | ICD-10-CM

## 2019-03-17 DIAGNOSIS — N390 Urinary tract infection, site not specified: Secondary | ICD-10-CM | POA: Diagnosis present

## 2019-03-17 LAB — BASIC METABOLIC PANEL
Anion gap: 9 (ref 5–15)
BUN: 24 mg/dL — ABNORMAL HIGH (ref 8–23)
CO2: 30 mmol/L (ref 22–32)
Calcium: 9.3 mg/dL (ref 8.9–10.3)
Chloride: 104 mmol/L (ref 98–111)
Creatinine, Ser: 1.01 mg/dL — ABNORMAL HIGH (ref 0.44–1.00)
GFR calc Af Amer: 56 mL/min — ABNORMAL LOW (ref >60)
GFR calc non Af Amer: 49 mL/min — ABNORMAL LOW (ref >60)
Glucose, Bld: 202 mg/dL — ABNORMAL HIGH (ref 70–99)
Potassium: 3.8 mmol/L (ref 3.5–5.1)
Sodium: 143 mmol/L (ref 135–145)

## 2019-03-17 LAB — URINALYSIS, ROUTINE W REFLEX MICROSCOPIC
Bilirubin Urine: NEGATIVE
Glucose, UA: NEGATIVE mg/dL
Hgb urine dipstick: NEGATIVE
Ketones, ur: NEGATIVE mg/dL
Nitrite: NEGATIVE
Protein, ur: NEGATIVE mg/dL
Specific Gravity, Urine: 1.024 (ref 1.005–1.030)
WBC, UA: >50 WBC/hpf — ABNORMAL HIGH (ref 0–5)
pH: 5 (ref 5.0–8.0)

## 2019-03-17 LAB — CBG MONITORING, ED: Glucose-Capillary: 199 mg/dL — ABNORMAL HIGH (ref 70–99)

## 2019-03-17 LAB — CBC
HCT: 35.5 % — ABNORMAL LOW (ref 36.0–46.0)
Hemoglobin: 10.6 g/dL — ABNORMAL LOW (ref 12.0–15.0)
MCH: 29.5 pg (ref 26.0–34.0)
MCHC: 29.9 g/dL — ABNORMAL LOW (ref 30.0–36.0)
MCV: 98.9 fL (ref 80.0–100.0)
Platelets: 238 10*3/uL (ref 150–400)
RBC: 3.59 MIL/uL — ABNORMAL LOW (ref 3.87–5.11)
RDW: 14.7 % (ref 11.5–15.5)
WBC: 6.2 10*3/uL (ref 4.0–10.5)
nRBC: 0 % (ref 0.0–0.2)

## 2019-03-17 LAB — BRAIN NATRIURETIC PEPTIDE: B Natriuretic Peptide: 240 pg/mL — ABNORMAL HIGH (ref 0.0–100.0)

## 2019-03-17 MED ORDER — AZITHROMYCIN 250 MG PO TABS
500.0000 mg | ORAL_TABLET | Freq: Once | ORAL | Status: AC
  Administered 2019-03-17: 500 mg via ORAL
  Filled 2019-03-17: qty 2

## 2019-03-17 MED ORDER — SODIUM CHLORIDE 0.9 % IV SOLN
1.0000 g | Freq: Once | INTRAVENOUS | Status: AC
  Administered 2019-03-17: 1 g via INTRAVENOUS
  Filled 2019-03-17: qty 10

## 2019-03-17 MED ORDER — ONDANSETRON HCL 4 MG/2ML IJ SOLN
4.0000 mg | Freq: Once | INTRAMUSCULAR | Status: AC
  Administered 2019-03-17: 4 mg via INTRAVENOUS
  Filled 2019-03-17: qty 2

## 2019-03-17 NOTE — ED Triage Notes (Signed)
Sent here by daughter due to unable to care for mother   No bath in a week  Diaper not changed in several days per EMS  DR Nevada Crane is PCP- but family not able to get in tough

## 2019-03-17 NOTE — ED Provider Notes (Signed)
[facility] EMERGENCY DEPARTMENT Provider Note   CSN: [id number] Arrival date & time: 03/17/19  2033     History   Chief Complaint Chief Complaint  Patient presents with   Weakness    HPI Amanda Hale is a 85 y.o. female with history of type 2 diabetes mellitus, hyperlipidemia, hypertension, GERD, dysphagia, CHF, A. fib presents brought in by EMS for evaluation of progressively worsening weakness for 2 weeks.  She tells me that she typically ambulates with the aid of a walker but for the last 2 weeks or so has had significant difficulty ambulating and states that her legs will shake and quiver when she attempts to ambulate and bear weight.  Reports that her daughter who is her primary caregiver has been having difficulty caring for her.  She notes lower extremity edema for which she occasionally takes Lasix.  Per chart review she saw her cardiologist on 02/06/2027, has had good control of her A. fib with diltiazem and Lopressor, takes Lasix about once a month for her lower extremity edema which generally is mild.  She denies urinary symptoms, abdominal pain, nausea, vomiting, chest pain, or shortness of breath.  No fevers or chills.  Denies headaches, or paresthesias of the extremities.  No recent falls.  She is currently anticoagulated on Eliquis for her A. Fib.  She is DNR per my conversation with her today.      The history is provided by the patient.    Past Medical History:  Diagnosis Date   Arthritis    DM type 2 (diabetes mellitus, type 2) ([place])    Dyslipidemia    Hiatal hernia 2014   HTN (hypertension)    Osteoporosis    Reflux esophagitis 2014   erosive   Schatzki's ring 2014   Tubular adenoma     Patient Active Problem List   Diagnosis Date Noted   Acute lower UTI 03/18/2019   CAP (community acquired pneumonia) 03/18/2019   Dysphagia 02/23/2018   Diarrhea 02/23/2018   Pressure injury of skin 12/27/2017   Atrial fibrillation with RVR ([place])  12/24/2017   Chronic diastolic heart failure ([place]) [id number]   Periumbilical discomfort [id number]   Nausea with vomiting 05/05/2017   Pyloric stenosis, acquired 11/06/2015   Congenital hypertrophic pyloric stenosis    Upper abdominal pain 09/13/2015   Loss of weight 09/13/2015   GERD (gastroesophageal reflux disease) 09/13/2015   Esophageal motility disorder 02/21/2013   Cough 02/21/2013   Esophageal dysphagia 08/11/2012   Chronic constipation 08/11/2012   Personal history of colonic polyps 08/11/2012   Chest pain 07/27/2012   HTN (hypertension) 07/26/2012   Diabetes mellitus, type II ([place]) 07/26/2012   Dyslipidemia 07/26/2012    Past Surgical History:  Procedure Laterality Date   ABDOMINAL HYSTERECTOMY     APPENDECTOMY     BREAST BIOPSY     COLONOSCOPY N/A 09/08/2012   RMR: tubular adenoma, poor prep, needs surveillance April 2015    COLONOSCOPY  1990/1992/1996   [facility], multiple hyperplastic polyps   ESOPHAGOGASTRODUODENOSCOPY N/A 10/09/2015   RMR: pyloric stenosis dilated with scope.   ESOPHAGOGASTRODUODENOSCOPY (EGD) WITH ESOPHAGEAL DILATION N/A 09/08/2012   RMR: Schatzki's ring s/p 54- F dilation, erosive esophagitis, hiatal hernia, gastric/bulb erosions with mild pyloric stenosis. Negative path   EYE SURGERY     both   HIP SURGERY     right x 2   RECTAL SURGERY     prolapsed rectum/hemorrhoids     OB History   No obstetric  history on file.      Home Medications    Prior to Admission medications   Medication Sig Start Date End Date Taking? Authorizing Provider  ALPRAZolam Duanne Moron) 0.5 MG tablet Take 0.5 mg by mouth as needed.     [provider]  diltiazem (CARDIZEM CD) 120 MG 24 hr capsule Take 1 capsule (120 mg total) by mouth daily. 11/28/18 02/06/20  Arnoldo Lenis, MD  donepezil (ARICEPT) 10 MG tablet Take 10 mg by mouth at bedtime.    [provider]  ELIQUIS 2.5 MG TABS tablet TAKE 1  TABLET BY MOUTH TWICE DAILY 11/22/18   Arnoldo Lenis, MD  furosemide (LASIX) 20 MG tablet TAKE 1 TABLET BY MOUTH DAILY AS NEEDED FOR swelling 01/24/19   Branch, Alphonse Guild, MD  HYDROcodone-acetaminophen (NORCO/VICODIN) 5-325 MG tablet Take 1 tablet by mouth every 8 (eight) hours as needed. for pain 11/10/17   [provider]  meclizine (ANTIVERT) 25 MG tablet Take 25 mg by mouth 4 (four) times daily as needed.    [provider]  memantine (NAMENDA) 5 MG tablet Take 5 mg by mouth 2 (two) times daily.    [provider]  metFORMIN (GLUCOPHAGE) 500 MG tablet Take 500 mg by mouth 2 (two) times daily.  11/04/17   [provider]  metoprolol tartrate (LOPRESSOR) 25 MG tablet Take 25 mg by mouth 2 (two) times daily. = 75mg  total    [provider]  metoprolol tartrate (LOPRESSOR) 50 MG tablet Take 50 mg by mouth 2 (two) times daily. = 75mg  total    [provider]  Multiple Vitamins-Minerals (PRESERVISION AREDS 2 PO) Take 1 tablet by mouth 2 (two) times daily.    [provider]    Family History Family History  Problem Relation Age of Onset   Hypertension Mother    Hypertension Son    Colon cancer Neg Hx     Social History Social History   Tobacco Use   Smoking status: Never Smoker   Smokeless tobacco: Never Used  Substance Use Topics   Alcohol use: No   Drug use: No     Allergies   Patient has no known allergies.   Review of Systems Review of Systems  Constitutional: Positive for fatigue. Negative for chills and fever.  Respiratory: Negative for cough and shortness of breath.   Cardiovascular: Positive for leg swelling. Negative for chest pain.  Gastrointestinal: Negative for abdominal pain, nausea and vomiting.  Genitourinary: Negative for dysuria, frequency, hematuria and urgency.  Neurological: Positive for weakness. Negative for syncope, numbness and headaches.  All other systems reviewed and are  negative.    Physical Exam Updated Vital Signs BP 123/68 (BP Location: Right Arm)    Pulse (!) 55    Temp 98.3 F (36.8 C) (Oral)    Resp 18    Ht 5\' 1"  (1.549 m)    Wt 68 kg    SpO2 97%    BMI 28.34 kg/m   Physical Exam Vitals signs and nursing note reviewed.  Constitutional:      General: She is not in acute distress.    Appearance: She is well-developed.     Comments: Resting comfortably in bed  HENT:     Head: Normocephalic and atraumatic.  Eyes:     General:        Right eye: No discharge.        Left eye: No discharge.     Extraocular Movements: Extraocular movements  intact.     Conjunctiva/sclera: Conjunctivae normal.     Pupils: Pupils are equal, round, and reactive to light.     Comments: Pupils are myotic but equal bilaterally, minimally reactive to light.  Neck:     Musculoskeletal: Neck supple.     Vascular: No JVD.     Trachea: No tracheal deviation.  Cardiovascular:     Rate and Rhythm: Normal rate. Rhythm irregular.     Pulses: Normal pulses.     Heart sounds: Normal heart sounds.     Comments: 2+ pitting edema of the bilateral lower extremities with some mild erythema distally, Homans' sign absent bilaterally.  Compartments are soft. Pulmonary:     Effort: Pulmonary effort is normal.     Breath sounds: Normal breath sounds.  Abdominal:     General: Bowel sounds are normal. There is no distension.     Palpations: Abdomen is soft.     Tenderness: There is no abdominal tenderness. There is no right CVA tenderness, left CVA tenderness, guarding or rebound.  Musculoskeletal:     Comments: No midline spine TTP, no paraspinal muscle tenderness, no deformity, crepitus, or step-off noted. In general appears physically deconditioned.  Skin:    General: Skin is warm and dry.     Findings: No erythema.  Neurological:     Mental Status: She is alert.     Comments: Speech is fluent with no evidence of aphasia.  Cranial nerves II through XII tested and intact.   Patient is oriented to person place month and knows who the President of the [place] is.  5/5 strength of BUE major muscle groups.  No pronator drift.  3/5 strength of bilateral hip flexors and 4/5 strength of the bilateral lower extremities with plantarflexion and dorsiflexion against resistance.  Sensation intact to light touch of bilateral upper and lower extremities.  Psychiatric:        Behavior: Behavior normal.      ED Treatments / Results  Labs (all labs ordered are listed, but only abnormal results are displayed) Labs Reviewed  BASIC METABOLIC PANEL - Abnormal; Notable for the following components:      Result Value   Glucose, Bld 202 (*)    BUN 24 (*)    Creatinine, Ser 1.01 (*)    GFR calc non Af Amer 49 (*)    GFR calc Af Amer 56 (*)    All other components within normal limits  CBC - Abnormal; Notable for the following components:   RBC 3.59 (*)    Hemoglobin 10.6 (*)    HCT 35.5 (*)    MCHC 29.9 (*)    All other components within normal limits  URINALYSIS, ROUTINE W REFLEX MICROSCOPIC - Abnormal; Notable for the following components:   APPearance HAZY (*)    Leukocytes,Ua MODERATE (*)    WBC, UA >50 (*)    Bacteria, UA MANY (*)    All other components within normal limits  BRAIN NATRIURETIC PEPTIDE - Abnormal; Notable for the following components:   B Natriuretic Peptide 240.0 (*)    All other components within normal limits  CBC - Abnormal; Notable for the following components:   RBC 3.74 (*)    Hemoglobin 11.2 (*)    All other components within normal limits  COMPREHENSIVE METABOLIC PANEL - Abnormal; Notable for the following components:   Glucose, Bld 111 (*)    BUN 26 (*)    Total Protein 6.1 (*)    Albumin 3.2 (*)  AST 100 (*)    ALT 65 (*)    GFR calc non Af Amer 57 (*)    All other components within normal limits  CBG MONITORING, ED - Abnormal; Notable for the following components:   Glucose-Capillary 199 (*)    All other components within  normal limits  CBG MONITORING, ED - Abnormal; Notable for the following components:   Glucose-Capillary 135 (*)    All other components within normal limits  CULTURE, BLOOD (ROUTINE X 2)  CULTURE, BLOOD (ROUTINE X 2)  URINE CULTURE  HIV ANTIBODY (ROUTINE TESTING W REFLEX)  LEGIONELLA PNEUMOPHILA SEROGP 1 UR AG  STREP PNEUMONIAE URINARY ANTIGEN    EKG EKG Interpretation  Date/Time:  Friday March 17 2019 20:47:51 EDT Ventricular Rate:  66 PR Interval:    QRS Duration: 78 QT Interval:  497 QTC Calculation: 521 R Axis:   16 Text Interpretation: Atrial fibrillation Low voltage, extremity and precordial leads Prolonged QT interval Abnormal ECG Confirmed by Carmin Muskrat [phone]) on 03/17/2019 9:15:23 PM   Radiology Dg Chest 2 View  Result Date: 03/17/2019 CLINICAL DATA:  85 year old female with weakness. EXAM: CHEST - 2 VIEW COMPARISON:  Chest radiograph dated 12/24/2017 and CT dated 08/08/2015 FINDINGS: Small left pleural effusion and associated left lung base atelectasis versus infiltrate, similar or slightly increased since the prior radiograph. The right lung is clear. There is no pneumothorax. Stable top-normal cardiac size. Atherosclerotic calcification of the aorta. Osteopenia with degenerative changes of the spine. No acute osseous pathology. IMPRESSION: Small left pleural effusion and associated left lung base atelectasis versus infiltrate, similar or slightly increased since the prior radiograph. Electronically Signed   By: Anner Crete M.D.   On: 03/17/2019 22:24   Ct Head Wo Contrast  Result Date: 03/18/2019 CLINICAL DATA:  Weakness. EXAM: CT HEAD WITHOUT CONTRAST TECHNIQUE: Contiguous axial images were obtained from the base of the skull through the vertex without intravenous contrast. COMPARISON:  07/09/2018 FINDINGS: Brain: No evidence of acute infarction, hemorrhage, hydrocephalus, extra-axial collection or mass lesion/mass effect. Again noted is atrophy and  advanced chronic microvascular ischemic changes. Vascular: No hyperdense vessel or unexpected calcification. Skull: Normal. Negative for fracture or focal lesion. Sinuses/Orbits: No acute finding. Other: None. IMPRESSION: No acute intracranial abnormality. Electronically Signed   By: Constance Holster M.D.   On: 03/18/2019 02:29   Ct Chest Wo Contrast  Result Date: 03/18/2019 CLINICAL DATA:  Pleural effusion. EXAM: CT CHEST WITHOUT CONTRAST TECHNIQUE: Multidetector CT imaging of the chest was performed following the standard protocol without IV contrast. COMPARISON:  CT August 08, 2015. FINDINGS: Cardiovascular: The heart size is enlarged with significant biatrial enlargement. Coronary artery calcifications are noted. Thoracic aortic calcifications are noted. There is no significant pericardial effusion. Mediastinum/Nodes: --No mediastinal or hilar lymphadenopathy. --No axillary lymphadenopathy. --No supraclavicular lymphadenopathy. --Normal thyroid gland. --The esophagus is unremarkable Lungs/Pleura: There is a stable 3 mm pulmonary nodule in the right middle lobe (axial series 4, image 78). There are trace bilateral pleural effusions. There is no pneumothorax. Upper Abdomen: There is cholelithiasis involving the partially visualized gallbladder. Musculoskeletal: No chest wall abnormality. No acute or significant osseous findings. Review of the MIP images confirms the above findings. IMPRESSION: 1. Trace bilateral pleural effusions. 2. No focal infiltrate. 3. There is cholelithiasis without secondary signs of acute cholecystitis. Aortic Atherosclerosis (ICD10-I70.0). Electronically Signed   By: Constance Holster M.D.   On: 03/18/2019 02:34    Procedures Procedures (including critical care time)  Medications Ordered in ED Medications  insulin aspart (novoLOG) injection 0-9 Units (has no administration in time range)  insulin aspart (novoLOG) injection 0-5 Units (1 Units Subcutaneous Given 03/18/19  1030)  cefTRIAXone (ROCEPHIN) 1 g in sodium chloride 0.9 % 100 mL IVPB (has no administration in time range)  HYDROcodone-acetaminophen (NORCO/VICODIN) 5-325 MG per tablet 1 tablet (1 tablet Oral Given 03/18/19 1029)  memantine (NAMENDA) tablet 5 mg (has no administration in time range)  donepezil (ARICEPT) tablet 10 mg (has no administration in time range)  apixaban (ELIQUIS) tablet 2.5 mg (has no administration in time range)  metoprolol tartrate (LOPRESSOR) tablet 75 mg (75 mg Oral Given 03/18/19 1029)  cefTRIAXone (ROCEPHIN) 1 g in sodium chloride 0.9 % 100 mL IVPB (0 g Intravenous Stopped 03/18/19 0031)  azithromycin (ZITHROMAX) tablet 500 mg (500 mg Oral Given 03/17/19 2314)  ondansetron (ZOFRAN) injection 4 mg (4 mg Intravenous Given 03/17/19 2314)     Initial Impression / Assessment and Plan / ED Course  I have reviewed the triage vital signs and the nursing notes.  Pertinent labs & imaging results that were available during my care of the patient were reviewed by me and considered in my medical decision making (see chart for details).        Patient presenting brought in by EMS for evaluation of weakness.  She tells me she is having difficulty ambulating with the aid of her walker.  She has peripheral edema on examination with some mild erythema but not obviously cellulitic.  Per chart review she has a history of CHF and takes Lasix as needed on average once a month, for management of mild pitting edema but on exam today she tells me that her edema is worse than normal.  She is afebrile, vital signs are stable.  She is nontoxic in appearance.  She does have some objective weakness of her lower extremities with strength testing but appears generally physically deconditioned with diffuse muscle wasting.  Will obtain lab work, UA, chest x-ray to assess for possible infectious etiology of her weakness.  No focal neurologic deficits noted on exam, doubt CVA.  Lab work today shows no  leukocytosis, mild anemia, mild elevation in BUN and creatinine.  Suspect the patient is possibly mildly dehydrated.  BNP mildly elevated.  10:00PM Signed out to oncoming provider PA Marshell Levan.  Pending chest x-ray and UA.  If evidence of infectious etiology of symptoms or possible CHF exacerbation then she will likely require admission to the hospital for further evaluation and management.  Otherwise, she will likely require case management or social work consultation for placement at a skilled nursing facility given her physical deconditioning, malnutrition, and inability for family to care for her.  She would likely benefit from some rehab.  Patient was seen and evaluated by Dr. Vanita Panda who agrees with assessment and plan at this time.  Final Clinical Impressions(s) / ED Diagnoses   Final diagnoses:  Community acquired pneumonia of left lower lobe of lung  Peripheral edema  Acute cystitis without hematuria  Weakness    ED Discharge Orders    None       Debroah Baller 03/18/19 1116    Carmin Muskrat, MD 03/19/19 [phone]

## 2019-03-17 NOTE — ED Notes (Signed)
Pt reports that she is at home with her daughter "and she doesn't want to help me"  Here for evaluation of weakness and possible placement per ems

## 2019-03-18 ENCOUNTER — Inpatient Hospital Stay (HOSPITAL_COMMUNITY): Payer: Medicare Other

## 2019-03-18 ENCOUNTER — Encounter (HOSPITAL_COMMUNITY): Payer: Self-pay | Admitting: Internal Medicine

## 2019-03-18 DIAGNOSIS — B9620 Unspecified Escherichia coli [E. coli] as the cause of diseases classified elsewhere: Secondary | ICD-10-CM | POA: Diagnosis present

## 2019-03-18 DIAGNOSIS — I6782 Cerebral ischemia: Secondary | ICD-10-CM | POA: Diagnosis not present

## 2019-03-18 DIAGNOSIS — K219 Gastro-esophageal reflux disease without esophagitis: Secondary | ICD-10-CM | POA: Diagnosis not present

## 2019-03-18 DIAGNOSIS — I480 Paroxysmal atrial fibrillation: Secondary | ICD-10-CM | POA: Diagnosis present

## 2019-03-18 DIAGNOSIS — Z8719 Personal history of other diseases of the digestive system: Secondary | ICD-10-CM | POA: Diagnosis not present

## 2019-03-18 DIAGNOSIS — E119 Type 2 diabetes mellitus without complications: Secondary | ICD-10-CM | POA: Diagnosis not present

## 2019-03-18 DIAGNOSIS — N390 Urinary tract infection, site not specified: Secondary | ICD-10-CM | POA: Diagnosis not present

## 2019-03-18 DIAGNOSIS — Z66 Do not resuscitate: Secondary | ICD-10-CM | POA: Diagnosis present

## 2019-03-18 DIAGNOSIS — J189 Pneumonia, unspecified organism: Secondary | ICD-10-CM

## 2019-03-18 DIAGNOSIS — I110 Hypertensive heart disease with heart failure: Secondary | ICD-10-CM | POA: Diagnosis present

## 2019-03-18 DIAGNOSIS — I5032 Chronic diastolic (congestive) heart failure: Secondary | ICD-10-CM | POA: Diagnosis present

## 2019-03-18 DIAGNOSIS — I10 Essential (primary) hypertension: Secondary | ICD-10-CM | POA: Diagnosis not present

## 2019-03-18 DIAGNOSIS — E860 Dehydration: Secondary | ICD-10-CM | POA: Diagnosis present

## 2019-03-18 DIAGNOSIS — R531 Weakness: Secondary | ICD-10-CM | POA: Diagnosis not present

## 2019-03-18 DIAGNOSIS — Z8249 Family history of ischemic heart disease and other diseases of the circulatory system: Secondary | ICD-10-CM | POA: Diagnosis not present

## 2019-03-18 DIAGNOSIS — J40 Bronchitis, not specified as acute or chronic: Secondary | ICD-10-CM | POA: Diagnosis present

## 2019-03-18 DIAGNOSIS — F0390 Unspecified dementia without behavioral disturbance: Secondary | ICD-10-CM | POA: Diagnosis present

## 2019-03-18 DIAGNOSIS — N3000 Acute cystitis without hematuria: Secondary | ICD-10-CM | POA: Diagnosis present

## 2019-03-18 DIAGNOSIS — Z7901 Long term (current) use of anticoagulants: Secondary | ICD-10-CM | POA: Diagnosis not present

## 2019-03-18 DIAGNOSIS — Z20828 Contact with and (suspected) exposure to other viral communicable diseases: Secondary | ICD-10-CM | POA: Diagnosis present

## 2019-03-18 DIAGNOSIS — J90 Pleural effusion, not elsewhere classified: Secondary | ICD-10-CM | POA: Diagnosis not present

## 2019-03-18 DIAGNOSIS — R609 Edema, unspecified: Secondary | ICD-10-CM | POA: Diagnosis not present

## 2019-03-18 DIAGNOSIS — R262 Difficulty in walking, not elsewhere classified: Secondary | ICD-10-CM | POA: Diagnosis present

## 2019-03-18 DIAGNOSIS — R9431 Abnormal electrocardiogram [ECG] [EKG]: Secondary | ICD-10-CM | POA: Diagnosis present

## 2019-03-18 LAB — COMPREHENSIVE METABOLIC PANEL
ALT: 65 U/L — ABNORMAL HIGH (ref 0–44)
AST: 100 U/L — ABNORMAL HIGH (ref 15–41)
Albumin: 3.2 g/dL — ABNORMAL LOW (ref 3.5–5.0)
Alkaline Phosphatase: 102 U/L (ref 38–126)
Anion gap: 7 (ref 5–15)
BUN: 26 mg/dL — ABNORMAL HIGH (ref 8–23)
CO2: 30 mmol/L (ref 22–32)
Calcium: 9.1 mg/dL (ref 8.9–10.3)
Chloride: 107 mmol/L (ref 98–111)
Creatinine, Ser: 0.89 mg/dL (ref 0.44–1.00)
GFR calc Af Amer: >60 mL/min (ref >60)
GFR calc non Af Amer: 57 mL/min — ABNORMAL LOW (ref >60)
Glucose, Bld: 111 mg/dL — ABNORMAL HIGH (ref 70–99)
Potassium: 3.8 mmol/L (ref 3.5–5.1)
Sodium: 144 mmol/L (ref 135–145)
Total Bilirubin: 0.4 mg/dL (ref 0.3–1.2)
Total Protein: 6.1 g/dL — ABNORMAL LOW (ref 6.5–8.1)

## 2019-03-18 LAB — CBC
HCT: 37 % (ref 36.0–46.0)
Hemoglobin: 11.2 g/dL — ABNORMAL LOW (ref 12.0–15.0)
MCH: 29.9 pg (ref 26.0–34.0)
MCHC: 30.3 g/dL (ref 30.0–36.0)
MCV: 98.9 fL (ref 80.0–100.0)
Platelets: 225 10*3/uL (ref 150–400)
RBC: 3.74 MIL/uL — ABNORMAL LOW (ref 3.87–5.11)
RDW: 14.6 % (ref 11.5–15.5)
WBC: 5.9 10*3/uL (ref 4.0–10.5)
nRBC: 0 % (ref 0.0–0.2)

## 2019-03-18 LAB — CBG MONITORING, ED
Glucose-Capillary: 135 mg/dL — ABNORMAL HIGH (ref 70–99)
Glucose-Capillary: 197 mg/dL — ABNORMAL HIGH (ref 70–99)

## 2019-03-18 LAB — HIV ANTIBODY (ROUTINE TESTING W REFLEX): HIV Screen 4th Generation wRfx: NONREACTIVE

## 2019-03-18 MED ORDER — INSULIN ASPART 100 UNIT/ML ~~LOC~~ SOLN
0.0000 [IU] | Freq: Three times a day (TID) | SUBCUTANEOUS | Status: DC
  Administered 2019-03-18: 3 [IU] via SUBCUTANEOUS
  Administered 2019-03-20 (×2): 1 [IU] via SUBCUTANEOUS
  Filled 2019-03-18 (×2): qty 1

## 2019-03-18 MED ORDER — SODIUM CHLORIDE 0.9 % IV SOLN
1.0000 g | INTRAVENOUS | Status: DC
  Administered 2019-03-19: 1 g via INTRAVENOUS
  Filled 2019-03-18: qty 10

## 2019-03-18 MED ORDER — MEMANTINE HCL 10 MG PO TABS
5.0000 mg | ORAL_TABLET | Freq: Two times a day (BID) | ORAL | Status: DC
  Administered 2019-03-18 – 2019-03-20 (×4): 5 mg via ORAL
  Filled 2019-03-18 (×7): qty 1

## 2019-03-18 MED ORDER — APIXABAN 2.5 MG PO TABS
2.5000 mg | ORAL_TABLET | Freq: Two times a day (BID) | ORAL | Status: DC
  Administered 2019-03-18: 2.5 mg via ORAL
  Filled 2019-03-18 (×5): qty 1

## 2019-03-18 MED ORDER — METOPROLOL TARTRATE 25 MG PO TABS: 25.0000 mg | ORAL_TABLET | Freq: Two times a day (BID) | ORAL | Status: DC

## 2019-03-18 MED ORDER — DONEPEZIL HCL 5 MG PO TABS
10.0000 mg | ORAL_TABLET | Freq: Every day | ORAL | Status: DC
  Administered 2019-03-19: 10 mg via ORAL
  Filled 2019-03-18 (×2): qty 1
  Filled 2019-03-18: qty 2

## 2019-03-18 MED ORDER — METOPROLOL TARTRATE 50 MG PO TABS
75.0000 mg | ORAL_TABLET | Freq: Two times a day (BID) | ORAL | Status: DC
  Administered 2019-03-18 – 2019-03-20 (×4): 75 mg via ORAL
  Filled 2019-03-18 (×4): qty 1

## 2019-03-18 MED ORDER — HYDROCODONE-ACETAMINOPHEN 5-325 MG PO TABS
1.0000 | ORAL_TABLET | Freq: Three times a day (TID) | ORAL | Status: DC | PRN
  Administered 2019-03-18 – 2019-03-20 (×3): 1 via ORAL
  Filled 2019-03-18 (×3): qty 1

## 2019-03-18 MED ORDER — SODIUM CHLORIDE 0.9 % IV SOLN: 500.0000 mg | INTRAVENOUS | Status: DC

## 2019-03-18 MED ORDER — ENOXAPARIN SODIUM 40 MG/0.4ML ~~LOC~~ SOLN: 40.0000 mg | SUBCUTANEOUS | Status: DC

## 2019-03-18 MED ORDER — METOPROLOL TARTRATE 50 MG PO TABS: 50.0000 mg | ORAL_TABLET | Freq: Two times a day (BID) | ORAL | Status: DC

## 2019-03-18 MED ORDER — INSULIN ASPART 100 UNIT/ML ~~LOC~~ SOLN
0.0000 [IU] | Freq: Every day | SUBCUTANEOUS | Status: DC
  Administered 2019-03-18: 1 [IU] via SUBCUTANEOUS

## 2019-03-18 NOTE — ED Notes (Signed)
Pts daughter called and updated on plan of care. States that she will be here in the morning to see pt.

## 2019-03-18 NOTE — H&P (Signed)
TRH H&P    Patient Demographics:    Amanda Hale, is a 85 y.o. female  MRN: [id number]  DOB - [birth year]  Admit Date - 03/17/2019  Referring MD/NP/PA:  Lily Kocher  Outpatient Primary MD for the patient is Celene Squibb, MD  Patient coming from: home  Chief complaint- weakness   HPI:    Amanda Hale  is a 85 y.o. female, w hypertension, hyperlipidemia, Dm2, Gerd/ Schatzki's ring, who presents with c/o generalized weakness.  Pt daughter notes that her fluid medication was increased for lower extremity edema about 1 week ago.  Pt denies headache, focal weakness, numbness, tingling.   In ED,  T 97.8 P 61, R 15, Bp 115/57  Pox 98% on RA Wt 68 kg  CXR IMPRESSION: Small left pleural effusion and associated left lung base atelectasis versus infiltrate, similar or slightly increased since the prior radiograph.  Na 143, K 3.8, Bun 24, Creatinine 1.01 Wbc 6.2, Hgb 10.6, Plt 238 BNP 240.0  Urinalysis wbc >50, rbc 0-5  Pt tx with rocephin 1gm iv x1 and zithromax 500mg  iv x1 in ED.   Pt will be admitted for generalized weakness secondary to UTI, CAP.      Review of systems:    In addition to the HPI above,  No Fever-chills, No Headache, No changes with Vision or hearing, No problems swallowing food or Liquids, No Chest pain, Cough or Shortness of Breath, No Abdominal pain, No Nausea or Vomiting, bowel movements are regular, No Blood in stool or Urine, No dysuria, No new skin rashes or bruises, No new joints pains-aches,  No new weakness, tingling, numbness in any extremity, No recent weight gain or loss, No polyuria, polydypsia or polyphagia, No significant Mental Stressors.  All other systems reviewed and are negative.    Past History of the following :    Past Medical History:  Diagnosis Date  . Arthritis   . DM type 2 (diabetes mellitus, type 2) ([place])   . Dyslipidemia   . Hiatal  hernia 2014  . HTN (hypertension)   . Osteoporosis   . Reflux esophagitis 2014   erosive  . Schatzki's ring 2014  . Tubular adenoma       Past Surgical History:  Procedure Laterality Date  . ABDOMINAL HYSTERECTOMY    . APPENDECTOMY    . BREAST BIOPSY    . COLONOSCOPY N/A 09/08/2012   RMR: tubular adenoma, poor prep, needs surveillance April 2015   . COLONOSCOPY  1990/1992/1996   [facility], multiple hyperplastic polyps  . ESOPHAGOGASTRODUODENOSCOPY N/A 10/09/2015   RMR: pyloric stenosis dilated with scope.  . ESOPHAGOGASTRODUODENOSCOPY (EGD) WITH ESOPHAGEAL DILATION N/A 09/08/2012   RMR: Schatzki's ring s/p 54- F dilation, erosive esophagitis, hiatal hernia, gastric/bulb erosions with mild pyloric stenosis. Negative path  . EYE SURGERY     both  . HIP SURGERY     right x 2  . RECTAL SURGERY     prolapsed rectum/hemorrhoids      Social History:      Social History  Tobacco Use  . Smoking status: Never Smoker  . Smokeless tobacco: Never Used  Substance Use Topics  . Alcohol use: No       Family History :     Family History  Problem Relation Age of Onset  . Hypertension Mother   . Hypertension Son   . Colon cancer Neg Hx        Home Medications:   Prior to Admission medications   Medication Sig Start Date End Date Taking? Authorizing Provider  ALPRAZolam Duanne Moron) 0.5 MG tablet Take 0.5 mg by mouth as needed.     [provider]  diltiazem (CARDIZEM CD) 120 MG 24 hr capsule Take 1 capsule (120 mg total) by mouth daily. 11/28/18 02/06/20  Arnoldo Lenis, MD  donepezil (ARICEPT) 10 MG tablet Take 10 mg by mouth at bedtime.    [provider]  ELIQUIS 2.5 MG TABS tablet TAKE 1 TABLET BY MOUTH TWICE DAILY 11/22/18   Arnoldo Lenis, MD  furosemide (LASIX) 20 MG tablet TAKE 1 TABLET BY MOUTH DAILY AS NEEDED FOR swelling 01/24/19   Branch, Alphonse Guild, MD  HYDROcodone-acetaminophen (NORCO/VICODIN) 5-325 MG tablet Take 1 tablet by  mouth every 8 (eight) hours as needed. for pain 11/10/17   [provider]  meclizine (ANTIVERT) 25 MG tablet Take 25 mg by mouth 4 (four) times daily as needed.    [provider]  memantine (NAMENDA) 5 MG tablet Take 5 mg by mouth 2 (two) times daily.    [provider]  metFORMIN (GLUCOPHAGE) 500 MG tablet Take 500 mg by mouth 2 (two) times daily.  11/04/17   [provider]  metoprolol tartrate (LOPRESSOR) 25 MG tablet Take 25 mg by mouth 2 (two) times daily. = 75mg  total    [provider]  metoprolol tartrate (LOPRESSOR) 50 MG tablet Take 50 mg by mouth 2 (two) times daily. = 75mg  total    [provider]  Multiple Vitamins-Minerals (PRESERVISION AREDS 2 PO) Take 1 tablet by mouth 2 (two) times daily.    [provider]     Allergies:    No Known Allergies   Physical Exam:   Vitals  Blood pressure (!) 134/115, pulse 76, temperature 97.8 F (36.6 C), temperature source Oral, resp. rate 15, height 5\' 1"  (1.549 m), weight 68 kg, SpO2 99 %.  1.  General: axoxo3 (person, hospital, year)  2. Psychiatric: euthymic  3. Neurologic: Cn 2-12 intact, reflexes 2+ symmetric, diffuse with no clonus, motor 5/5 in all 4 ext  4. HEENMT:  Anicteric, pupils 61mm symmetric, direct, consensual, intact Neck: no jvd  5. Respiratory : Slight crackles right lung base, no wheezing,  Slight decrease in bs, left lung base  6. Cardiovascular : rrr s1, s2, no m/g/r  7. Gastrointestinal:  Abd: soft, nt, nd, +bs  8. Skin:  Ext: no c/c/e,  No rash  9.Musculoskeletal:  Good ROM    Data Review:    CBC Recent Labs  Lab 03/17/19 2101  WBC 6.2  HGB 10.6*  HCT 35.5*  PLT 238  MCV 98.9  MCH 29.5  MCHC 29.9*  RDW 14.7   ------------------------------------------------------------------------------------------------------------------  Results for orders placed or performed during the hospital encounter of 03/17/19 (from the  past 48 hour(s))  CBG monitoring, ED     Status: Abnormal   Collection Time: 03/17/19  8:58 PM  Result Value Ref Range   Glucose-Capillary 199 (H) 70 - 99 mg/dL  Basic metabolic panel  Status: Abnormal   Collection Time: 03/17/19  9:01 PM  Result Value Ref Range   Sodium 143 135 - 145 mmol/L   Potassium 3.8 3.5 - 5.1 mmol/L   Chloride 104 98 - 111 mmol/L   CO2 30 22 - 32 mmol/L   Glucose, Bld 202 (H) 70 - 99 mg/dL   BUN 24 (H) 8 - 23 mg/dL   Creatinine, Ser 1.01 (H) 0.44 - 1.00 mg/dL   Calcium 9.3 8.9 - 10.3 mg/dL   GFR calc non Af Amer 49 (L) >60 mL/min   GFR calc Af Amer 56 (L) >60 mL/min   Anion gap 9 5 - 15    Comment: Performed at [facility], [address], [place], [place] [address]  CBC     Status: Abnormal   Collection Time: 03/17/19  9:01 PM  Result Value Ref Range   WBC 6.2 4.0 - 10.5 K/uL   RBC 3.59 (L) 3.87 - 5.11 MIL/uL   Hemoglobin 10.6 (L) 12.0 - 15.0 g/dL   HCT 35.5 (L) 36.0 - 46.0 %   MCV 98.9 80.0 - 100.0 fL   MCH 29.5 26.0 - 34.0 pg   MCHC 29.9 (L) 30.0 - 36.0 g/dL   RDW 14.7 11.5 - 15.5 %   Platelets 238 150 - 400 K/uL   nRBC 0.0 0.0 - 0.2 %    Comment: Performed at [facility], [address] [place], [place] [address]  Brain natriuretic peptide     Status: Abnormal   Collection Time: 03/17/19  9:01 PM  Result Value Ref Range   B Natriuretic Peptide 240.0 (H) 0.0 - 100.0 pg/mL    Comment: Performed at [facility], [address] [place], [place] [address]  Urinalysis, Routine w reflex microscopic     Status: Abnormal   Collection Time: 03/17/19 10:50 PM  Result Value Ref Range   Color, Urine YELLOW YELLOW   APPearance HAZY (A) CLEAR   Specific Gravity, Urine 1.024 1.005 - 1.030   pH 5.0 5.0 - 8.0   Glucose, UA NEGATIVE NEGATIVE mg/dL   Hgb urine dipstick NEGATIVE NEGATIVE   Bilirubin Urine NEGATIVE NEGATIVE   Ketones, ur NEGATIVE NEGATIVE mg/dL   Protein, ur NEGATIVE NEGATIVE mg/dL   Nitrite NEGATIVE NEGATIVE   Leukocytes,Ua MODERATE (A)  NEGATIVE   RBC / HPF 0-5 0 - 5 RBC/hpf   WBC, UA >50 (H) 0 - 5 WBC/hpf   Bacteria, UA MANY (A) NONE SEEN   WBC Clumps PRESENT    Mucus PRESENT    Hyaline Casts, UA PRESENT     Comment: Performed at [facility], [address], [place], [place] [address]    Chemistries  Recent Labs  Lab 03/17/19 2101  NA 143  K 3.8  CL 104  CO2 30  GLUCOSE 202*  BUN 24*  CREATININE 1.01*  CALCIUM 9.3   ------------------------------------------------------------------------------------------------------------------  ------------------------------------------------------------------------------------------------------------------ GFR: Estimated Creatinine Clearance: 32 mL/min (A) (by C-G formula based on SCr of 1.01 mg/dL (H)). Liver Function Tests: No results for input(s): AST, ALT, ALKPHOS, BILITOT, PROT, ALBUMIN in the last 168 hours. No results for input(s): LIPASE, AMYLASE in the last 168 hours. No results for input(s): AMMONIA in the last 168 hours. Coagulation Profile: No results for input(s): INR, PROTIME in the last 168 hours. Cardiac Enzymes: No results for input(s): CKTOTAL, CKMB, CKMBINDEX, TROPONINI in the last 168 hours. BNP (last 3 results) No results for input(s): PROBNP in the last 8760 hours. HbA1C: No results for input(s): HGBA1C  in the last 72 hours. CBG: Recent Labs  Lab 03/17/19 2058  GLUCAP 199*   Lipid Profile: No results for input(s): CHOL, HDL, LDLCALC, TRIG, CHOLHDL, LDLDIRECT in the last 72 hours. Thyroid Function Tests: No results for input(s): TSH, T4TOTAL, FREET4, T3FREE, THYROIDAB in the last 72 hours. Anemia Panel: No results for input(s): VITAMINB12, FOLATE, FERRITIN, TIBC, IRON, RETICCTPCT in the last 72 hours.  --------------------------------------------------------------------------------------------------------------- Urine analysis:    Component Value Date/Time   COLORURINE YELLOW 03/17/2019 2250   APPEARANCEUR HAZY (A) 03/17/2019 2250    LABSPEC 1.024 03/17/2019 2250   PHURINE 5.0 03/17/2019 2250   GLUCOSEU NEGATIVE 03/17/2019 2250   HGBUR NEGATIVE 03/17/2019 2250   BILIRUBINUR NEGATIVE 03/17/2019 2250   KETONESUR NEGATIVE 03/17/2019 2250   PROTEINUR NEGATIVE 03/17/2019 2250   NITRITE NEGATIVE 03/17/2019 2250   LEUKOCYTESUR MODERATE (A) 03/17/2019 2250      Imaging Results:    Dg Chest 2 View  Result Date: 03/17/2019 CLINICAL DATA:  85 year old female with weakness. EXAM: CHEST - 2 VIEW COMPARISON:  Chest radiograph dated 12/24/2017 and CT dated 08/08/2015 FINDINGS: Small left pleural effusion and associated left lung base atelectasis versus infiltrate, similar or slightly increased since the prior radiograph. The right lung is clear. There is no pneumothorax. Stable top-normal cardiac size. Atherosclerotic calcification of the aorta. Osteopenia with degenerative changes of the spine. No acute osseous pathology. IMPRESSION: Small left pleural effusion and associated left lung base atelectasis versus infiltrate, similar or slightly increased since the prior radiograph. Electronically Signed   By: Anner Crete M.D.   On: 03/17/2019 22:24   Afib at 65, nl axis, poor R progression, no st-t changes c/w ischemia   Assessment & Plan:    Principal Problem:   Acute lower UTI Active Problems:   HTN (hypertension)   Diabetes mellitus, type II (HCC)   GERD (gastroesophageal reflux disease)   CAP (community acquired pneumonia)  Generalized weakness likely secondary to UTI  Check CT brain  Acute lower UTI Urine culture Blood culture x2 Rocephin 1gm iv qday  Pleural effusion CAP Blood culture x2 Urine strep antigen Urine legionella antigen Check CT chest Rocephin as above Zithromax 500mg  iv qday  Edema Cont Lasix 20mg  po qday  Pafib Tele Cont Cardizem CD 120mg  po qday Cont Metoprolol 100mg  po bid Cont Eliquis  Dm2 Cont Metformin 500mg  po bid fsbs ac and qhs, ISS  Dementia Cont Aricept 10mg  po qday  Cont Namenda 5mg  po bid Cont Xanax prn    DVT Prophylaxis-   Lovenox - SCDs   AM Labs Ordered, also please review Full Orders  Family Communication: Admission, patients condition and plan of care including tests being ordered have been discussed with the patient who indicate understanding and agree with the plan and Code Status.  Code Status:  DNR per patient, daughter confirms DNR  Admission status: Inpatient: Based on patients clinical presentation and evaluation of above clinical data, I have made determination that patient meets Inpatient criteria at this time. Pt has CAP, acute lower uti,  Pt will require iv abx, at her age and with her comorbities she has high risk of clinical deterioration. Pt will require > 2 nites stay.   Time spent in minutes : 70    Jani Gravel M.D on 03/18/2019 at 1:11 AM

## 2019-03-18 NOTE — ED Provider Notes (Signed)
Received patient at end of shift.   Patient is a 85 year old female who presents to the emergency department with a complaint of weakness.  The patient stated that she typically ambulates with the aid of a walker, but over the last 2weeks she has had difficulty with walking and notices that her legs are weak and that she begins to shake and quiver when she attempts to ambulate or bear weight.  Work-up is ongoing.  Chest x-ray and urine analysis pending.  Chest x-ray shows a small left pleural effusion associated with left lung base atelectasis versus infiltrate.  This is similar or slightly increased since the prior radiograph according to radiology.  Patient started on Rocephin and Zithromax.   Urinalysis shows a hazy specimen with a specific gravity of 1.024.  There is moderate leukocyte esterase present, greater than 50 white blood cells, and many bacteria.  White blood cells are present in clumps.  Culture has been sent to the lab.  Patient is been started on Rocephin.  Case discussed with Dr.Kim.  Patient to be admitted.  Dx. 1 weakness, 2 urinary tract infection, 3 left lung infiltrate   Lily Kocher, PA-C 03/18/19 0153    Carmin Muskrat, MD 03/19/19 [phone]

## 2019-03-18 NOTE — Progress Notes (Signed)
Patient seen and examined. Admitted after midnight secondary to general weakness and not feeling well. VS are stable and CT head neg. Patient CT chest rule out active cardiopulmonary process. UA suggesting infection. Please refer to H&P written by Dr. Maudie Mercury for further info/details on exam.   Plan: -continue IV antibiotics for now. -follow culture results -will get PT evaluation and follow clinical response.  -close follow up of volume status while holding diuretics.   Barton Dubois MD [phone]

## 2019-03-19 ENCOUNTER — Other Ambulatory Visit: Payer: Self-pay

## 2019-03-19 DIAGNOSIS — R531 Weakness: Secondary | ICD-10-CM

## 2019-03-19 DIAGNOSIS — I5032 Chronic diastolic (congestive) heart failure: Secondary | ICD-10-CM

## 2019-03-19 DIAGNOSIS — K219 Gastro-esophageal reflux disease without esophagitis: Secondary | ICD-10-CM

## 2019-03-19 DIAGNOSIS — I10 Essential (primary) hypertension: Secondary | ICD-10-CM

## 2019-03-19 DIAGNOSIS — N3000 Acute cystitis without hematuria: Principal | ICD-10-CM

## 2019-03-19 LAB — GLUCOSE, CAPILLARY
Glucose-Capillary: 100 mg/dL — ABNORMAL HIGH (ref 70–99)
Glucose-Capillary: 181 mg/dL — ABNORMAL HIGH (ref 70–99)

## 2019-03-19 LAB — CBG MONITORING, ED
Glucose-Capillary: 127 mg/dL — ABNORMAL HIGH (ref 70–99)
Glucose-Capillary: 178 mg/dL — ABNORMAL HIGH (ref 70–99)

## 2019-03-19 MED ORDER — APIXABAN 5 MG PO TABS
5.0000 mg | ORAL_TABLET | Freq: Two times a day (BID) | ORAL | Status: DC
  Administered 2019-03-19 – 2019-03-20 (×2): 5 mg via ORAL
  Filled 2019-03-19 (×2): qty 1

## 2019-03-19 MED ORDER — FUROSEMIDE 20 MG PO TABS
20.0000 mg | ORAL_TABLET | Freq: Every day | ORAL | Status: DC
  Administered 2019-03-19 – 2019-03-20 (×2): 20 mg via ORAL
  Filled 2019-03-19 (×2): qty 1

## 2019-03-19 MED ORDER — ALPRAZOLAM 0.5 MG PO TABS
0.5000 mg | ORAL_TABLET | Freq: Once | ORAL | Status: AC
  Administered 2019-03-19: 0.5 mg via ORAL
  Filled 2019-03-19: qty 1

## 2019-03-19 NOTE — Progress Notes (Signed)
PROGRESS NOTE    Amanda Hale  [id number] DOB: [birth year] DOA: 03/17/2019 PCP: Celene Squibb, MD     Brief Narrative:  85 y.o. female, w hypertension, hyperlipidemia, Dm2, Gerd/ Schatzki's ring, who presents with c/o generalized weakness.  Pt daughter notes that her fluid medication was increased for lower extremity edema about 1 week ago.  Pt denies headache, focal weakness, numbness, tingling.   In ED,  T 97.8 P 61, R 15, Bp 115/57  Pox 98% on RA Wt 68 kg  CXR IMPRESSION: Small left pleural effusion and associated left lung base atelectasis versus infiltrate, similar or slightly increased since the prior radiograph.   Assessment & Plan: 1-Acute lower UTI -Follow culture results  -continue IV antibiotics -Advised to maintain adequate hydration.  2-HTN (hypertension) -Stable -Continue current antihypertensive medications.  3-Diabetes mellitus, type II ([place]) -Will use a sliding scale insulin -Holding oral hypoglycemic medications.  4-GERD (gastroesophageal reflux disease) -Continue PPI  5-Chronic diastolic heart failure (HCC) -Overall compensated -Continue Lasix -Encouraged to follow low-sodium diet -Follow daily weights and strict I's and O's.  6-pleural effusion -Appears to be stable -CTA demonstrating chronic bronchitic changes -patient afebrile, normal WBCs and no complaining of coughing or shortness of breath -will no pursuit abx's for PNA.  7-dementia -Mild -Continue Aricept and Namenda -No behavioral disturbances currently.  8-general weakness and deconditioning -physical therapy has recommended home health PT -Weakness most likely associated with dehydration and acute UTI -Hopefully home in the next 24-48 hours.  DVT prophylaxis: eliquis Code Status: DNR Family Communication: No family at bedside. Disposition Plan: Remains inpatient, continue IV antibiotics, follow urine culture results.  Consultants:   None  Procedures:   See  below for x-ray report.  Antimicrobials:  Anti-infectives (From admission, onward)   Start     Dose/Rate Route Frequency Ordered Stop   03/19/19 2200  cefTRIAXone (ROCEPHIN) 1 g in sodium chloride 0.9 % 100 mL IVPB     1 g 200 mL/hr over 30 Minutes Intravenous Every 24 hours 03/18/19 0141     03/19/19 2000  azithromycin (ZITHROMAX) 500 mg in sodium chloride 0.9 % 250 mL IVPB  Status:  Discontinued     500 mg 250 mL/hr over 60 Minutes Intravenous Every 24 hours 03/18/19 0141 03/18/19 0800   03/17/19 2300  cefTRIAXone (ROCEPHIN) 1 g in sodium chloride 0.9 % 100 mL IVPB     1 g 200 mL/hr over 30 Minutes Intravenous  Once 03/17/19 2252 03/18/19 0031   03/17/19 2300  azithromycin (ZITHROMAX) tablet 500 mg     500 mg Oral  Once 03/17/19 2252 03/17/19 2314      Subjective: No fever, no chest pain, no nausea, no vomiting.  Still feeling weak, tired and deconditioned.  Objective: Vitals:   03/19/19 1315 03/19/19 1328 03/19/19 1330 03/19/19 1411  BP:  122/86 116/68 (!) 136/58  Pulse: (!) 50 97 83 61  Resp:  16  17  Temp:  98.1 F (36.7 C)  98.3 F (36.8 C)  TempSrc:  Oral  Oral  SpO2: 96% 99% 97% 99%  Weight:      Height:       No intake or output data in the 24 hours ending 03/19/19 1625 Filed Weights   03/17/19 2040  Weight: 68 kg    Examination: General exam: Alert, awake, oriented x 3; feeling weak and tired. No CP, no palpitations. No fever. Respiratory system: Respiratory effort normal. No using accessory muscles. Cardiovascular system: Rate controlled. No rubs or  gallops. Gastrointestinal system: Abdomen is nondistended, soft and nontender. No organomegaly or masses felt. Normal bowel sounds heard. Central nervous system: Alert and oriented. No focal neurological deficits. Extremities: No Cyanosis or clubbing; 1+ edema bilaterally.  Skin: No rashes, no petechiae.  Psychiatry: Judgement and insight appear normal. Mood & affect appropriate.    Data Reviewed: I have  personally reviewed following labs and imaging studies  CBC: Recent Labs  Lab 03/17/19 2101 03/18/19 0215  WBC 6.2 5.9  HGB 10.6* 11.2*  HCT 35.5* 37.0  MCV 98.9 98.9  PLT 238 [id number]   Basic Metabolic Panel: Recent Labs  Lab 03/17/19 2101 03/18/19 0215  NA 143 144  K 3.8 3.8  CL 104 107  CO2 30 30  GLUCOSE 202* 111*  BUN 24* 26*  CREATININE 1.01* 0.89  CALCIUM 9.3 9.1   GFR: Estimated Creatinine Clearance: 36.3 mL/min (by C-G formula based on SCr of 0.89 mg/dL).   Liver Function Tests: Recent Labs  Lab 03/18/19 0215  AST 100*  ALT 65*  ALKPHOS 102  BILITOT 0.4  PROT 6.1*  ALBUMIN 3.2*   CBG: Recent Labs  Lab 03/17/19 2058 03/18/19 0817 03/18/19 1847 03/19/19 0839 03/19/19 1246  GLUCAP 199* 135* 197* 127* 178*    Urine analysis:    Component Value Date/Time   COLORURINE YELLOW 03/17/2019 2250   APPEARANCEUR HAZY (A) 03/17/2019 2250   LABSPEC 1.024 03/17/2019 2250   PHURINE 5.0 03/17/2019 2250   GLUCOSEU NEGATIVE 03/17/2019 2250   HGBUR NEGATIVE 03/17/2019 2250   BILIRUBINUR NEGATIVE 03/17/2019 2250   Eugenio Saenz 03/17/2019 2250   PROTEINUR NEGATIVE 03/17/2019 2250   NITRITE NEGATIVE 03/17/2019 2250   LEUKOCYTESUR MODERATE (A) 03/17/2019 2250    Recent Results (from the past 240 hour(s))  Culture, blood (routine x 2) Call MD if unable to obtain prior to antibiotics being given     Status: None (Preliminary result)   Collection Time: 03/18/19  2:09 AM   Specimen: BLOOD LEFT HAND  Result Value Ref Range Status   Specimen Description BLOOD LEFT HAND  Final   Special Requests   Final    BOTTLES DRAWN AEROBIC ONLY Blood Culture results may not be optimal due to an inadequate volume of blood received in culture bottles   Culture   Final    NO GROWTH 1 DAY Performed at [facility], [address], [place], [place] [address]    Report Status PENDING  Incomplete  Culture, blood (routine x 2) Call MD if unable to obtain prior to antibiotics  being given     Status: None (Preliminary result)   Collection Time: 03/18/19  2:15 AM   Specimen: Right Antecubital; Blood  Result Value Ref Range Status   Specimen Description RIGHT ANTECUBITAL  Final   Special Requests   Final    BOTTLES DRAWN AEROBIC AND ANAEROBIC Blood Culture adequate volume   Culture   Final    NO GROWTH 1 DAY Performed at [facility], [address] [place], [place] [address]    Report Status PENDING  Incomplete     Radiology Studies: Dg Chest 2 View  Result Date: 03/17/2019 CLINICAL DATA:  85 year old female with weakness. EXAM: CHEST - 2 VIEW COMPARISON:  Chest radiograph dated 12/24/2017 and CT dated 08/08/2015 FINDINGS: Small left pleural effusion and associated left lung base atelectasis versus infiltrate, similar or slightly increased since the prior radiograph. The right lung is clear. There is no pneumothorax. Stable top-normal cardiac size. Atherosclerotic calcification of the aorta.  Osteopenia with degenerative changes of the spine. No acute osseous pathology. IMPRESSION: Small left pleural effusion and associated left lung base atelectasis versus infiltrate, similar or slightly increased since the prior radiograph. Electronically Signed   By: Anner Crete M.D.   On: 03/17/2019 22:24   Ct Head Wo Contrast  Result Date: 03/18/2019 CLINICAL DATA:  Weakness. EXAM: CT HEAD WITHOUT CONTRAST TECHNIQUE: Contiguous axial images were obtained from the base of the skull through the vertex without intravenous contrast. COMPARISON:  07/09/2018 FINDINGS: Brain: No evidence of acute infarction, hemorrhage, hydrocephalus, extra-axial collection or mass lesion/mass effect. Again noted is atrophy and advanced chronic microvascular ischemic changes. Vascular: No hyperdense vessel or unexpected calcification. Skull: Normal. Negative for fracture or focal lesion. Sinuses/Orbits: No acute finding. Other: None. IMPRESSION: No acute intracranial abnormality. Electronically  Signed   By: Constance Holster M.D.   On: 03/18/2019 02:29   Ct Chest Wo Contrast  Result Date: 03/18/2019 CLINICAL DATA:  Pleural effusion. EXAM: CT CHEST WITHOUT CONTRAST TECHNIQUE: Multidetector CT imaging of the chest was performed following the standard protocol without IV contrast. COMPARISON:  CT August 08, 2015. FINDINGS: Cardiovascular: The heart size is enlarged with significant biatrial enlargement. Coronary artery calcifications are noted. Thoracic aortic calcifications are noted. There is no significant pericardial effusion. Mediastinum/Nodes: --No mediastinal or hilar lymphadenopathy. --No axillary lymphadenopathy. --No supraclavicular lymphadenopathy. --Normal thyroid gland. --The esophagus is unremarkable Lungs/Pleura: There is a stable 3 mm pulmonary nodule in the right middle lobe (axial series 4, image 78). There are trace bilateral pleural effusions. There is no pneumothorax. Upper Abdomen: There is cholelithiasis involving the partially visualized gallbladder. Musculoskeletal: No chest wall abnormality. No acute or significant osseous findings. Review of the MIP images confirms the above findings. IMPRESSION: 1. Trace bilateral pleural effusions. 2. No focal infiltrate. 3. There is cholelithiasis without secondary signs of acute cholecystitis. Aortic Atherosclerosis (ICD10-I70.0). Electronically Signed   By: Constance Holster M.D.   On: 03/18/2019 02:34    Scheduled Meds:  apixaban  5 mg Oral BID   donepezil  10 mg Oral QHS   furosemide  20 mg Oral Daily   insulin aspart  0-5 Units Subcutaneous QHS   insulin aspart  0-9 Units Subcutaneous TID WC   memantine  5 mg Oral BID   metoprolol tartrate  75 mg Oral BID   Continuous Infusions:  cefTRIAXone (ROCEPHIN)  IV       LOS: 1 day    Time spent: 30 minutes.   Barton Dubois, MD [facility] Pager [phone]   03/19/2019, 4:25 PM

## 2019-03-19 NOTE — Plan of Care (Signed)
  Problem: Acute Rehab PT Goals(only PT should resolve) Goal: Pt will Roll Supine to Side Outcome: Progressing Flowsheets (Taken 03/19/2019 1224) Pt will Roll Supine to Side: with min assist Goal: Pt Will Go Supine/Side To Sit Outcome: Progressing Flowsheets (Taken 03/19/2019 1224) Pt will go Supine/Side to Sit: with minimal assist Goal: Pt Will Go Sit To Supine/Side Outcome: Progressing Flowsheets (Taken 03/19/2019 1224) Pt will go Sit to Supine/Side: with minimal assist Goal: Patient Will Transfer Sit To/From Stand Outcome: Progressing Flowsheets (Taken 03/19/2019 1224) Patient will transfer sit to/from stand: with minimal assist Goal: Pt Will Transfer Bed To Chair/Chair To Bed Outcome: Progressing Flowsheets (Taken 03/19/2019 1224) Pt will Transfer Bed to Chair/Chair to Bed: min guard assist Goal: Pt Will Ambulate Outcome: Progressing Flowsheets (Taken 03/19/2019 1224) Pt will Ambulate:  with least restrictive assistive device  with min guard assist  100 feet   Pamala Hurry D. Hartnett-Rands, MS, PT Per [place] [phone] 03/19/2019

## 2019-03-19 NOTE — Evaluation (Signed)
Physical Therapy Evaluation Patient Details Name: Amanda Hale MRN: [id number] DOB: [birth year] Today's Date: 03/19/2019   History of Present Illness  Patient is a 85 year old female admittied to the ED 03/17/2019 with complaints of generalized weakness and diagnosis of acute lower UTI and CAP. PMH: w hypertension, hyperlipidemia, Dm2, Gerd/ Schatzki's ring, hiatal hernia, osteoporosis, arthritis, tubular adenoma, hip surgery x2 right. A and O x3.    Clinical Impression  Pt admitted with above diagnosis. Patient slow to move with increased effort and requires min to mod assist for bed mobility, transfers and ambulation with RW. Patient reported she has a towel bar at entry to shower and a suction cup grab bar in shower itself. Patient does not trust these sources for safe support in getting into and out of shower. PT agrees. Patient has 24/7 supervision and assistance at home with daughter and personal care assistance (3 days/wk x 5 hours/day). Pt currently with functional limitations due to the deficits listed below (see PT Problem List). Pt will benefit from skilled PT to increase their independence and safety with mobility to allow discharge to the venue listed below.       Follow Up Recommendations Home health PT;Supervision/Assistance - 24 hour    Equipment Recommendations  Other (comment)(HHPT assessment of shower access and safety)    Recommendations for Other Services       Precautions / Restrictions Precautions Precautions: Fall Restrictions Weight Bearing Restrictions: No      Mobility  Bed Mobility Overal bed mobility: Needs Assistance Bed Mobility: Supine to Sit;Sit to Supine     Supine to sit: Min assist Sit to supine: Mod assist   General bed mobility comments: slow, labored effort to get in/out of bed; commented gets stuck at home sometimes  Transfers Overall transfer level: Needs assistance Equipment used: Rolling walker (2 wheeled) Transfers: Sit to/from  [facility] Sit to Stand: Min assist Stand pivot transfers: Min guard       General transfer comment: transferring from high bed today; reports requires a riser on toilet to get off toilet at home  Ambulation/Gait Ambulation/Gait assistance: Min guard Gait Distance (Feet): 80 Feet Assistive device: Rolling walker (2 wheeled) Gait Pattern/deviations: Step-through pattern;Decreased step length - right;Decreased step length - left;Decreased stride length;Trunk flexed;Wide base of support Gait velocity: decreased   General Gait Details: slow, labored gait, limited by fatigue; involuntary whole body muscle fasciculations, arms, legs, and jaw noted  Stairs            Wheelchair Mobility    Modified Rankin (Stroke Patients Only)       Balance Overall balance assessment: Needs assistance Sitting-balance support: Bilateral upper extremity supported;Feet supported Sitting balance-Leahy Scale: Fair     Standing balance support: Bilateral upper extremity supported;During functional activity Standing balance-Leahy Scale: Fair Standing balance comment: fair/poor with RW                             Pertinent Vitals/Pain Pain Assessment: 0-10 Pain Score: 7  Pain Location: R hip, R shoulder, low back(history of 2 right THA) Pain Descriptors / Indicators: Burning Pain Intervention(s): Limited activity within patient's tolerance;Monitored during session    [place] expects to be discharged to:: Private residence Living Arrangements: Children(65 yr old daughter unable to assist much due to back pain) Available Help at Discharge: Personal care attendant;Available PRN/intermittently(3 days/wk possibly 5 hours/day) Type of Home: House Home Access: Level entry  Home Layout: One level Home Equipment: Shower seat;Bedside commode;Walker - 2 wheels;Walker - 4 wheels;Cane - single point;Wheelchair - manual;Grab bars - tub/shower;Grab bars  - toilet(grab bars in shower are suction cup kind)      Prior Function Level of Independence: Needs assistance   Gait / Transfers Assistance Needed: househild distances with 4 wheeled walker  ADL's / Homemaking Assistance Needed: assistance with ADL's and IADL's  Comments: daughter or personal assistant with patient at all times     Hand Dominance   Dominant Hand: Right    Extremity/Trunk Assessment   Upper Extremity Assessment Upper Extremity Assessment: Generalized weakness    Lower Extremity Assessment Lower Extremity Assessment: Generalized weakness    Cervical / Trunk Assessment Cervical / Trunk Assessment: Kyphotic  Communication   Communication: HOH  Cognition Arousal/Alertness: Awake/alert Behavior During Therapy: WFL for tasks assessed/performed Overall Cognitive Status: Within Functional Limits for tasks assessed                                        General Comments      Exercises     Assessment/Plan    PT Assessment Patient needs continued PT services  PT Problem List Decreased strength;Decreased activity tolerance;Decreased balance;Decreased mobility;Decreased knowledge of use of DME;Pain       PT Treatment Interventions DME instruction;Gait training;Therapeutic activities;Therapeutic exercise;Balance training;Neuromuscular re-education;Patient/family education    PT Goals (Current goals can be found in the Care Plan section)  Acute Rehab PT Goals Patient Stated Goal: get help making shower entry safer with sturdy grab bars PT Goal Formulation: With patient Time For Goal Achievement: 04/02/19 Potential to Achieve Goals: Fair    Frequency Min 3X/week   Barriers to discharge        Co-evaluation               AM-PAC PT "6 Clicks" Mobility  Outcome Measure Help needed turning from your back to your side while in a flat bed without using bedrails?: A Little Help needed moving from lying on your back to sitting on the  side of a flat bed without using bedrails?: A Lot Help needed moving to and from a bed to a chair (including a wheelchair)?: A Lot Help needed standing up from a chair using your arms (e.g., wheelchair or bedside chair)?: A Lot Help needed to walk in hospital room?: A Little Help needed climbing 3-5 steps with a railing? : A Lot 6 Click Score: 14    End of Session Equipment Utilized During Treatment: Gait belt Activity Tolerance: Patient tolerated treatment well;Patient limited by fatigue Patient left: in bed;with call bell/phone within reach Nurse Communication: Mobility status;Patient requests pain meds PT Visit Diagnosis: Unsteadiness on feet (R26.81);Other abnormalities of gait and mobility (R26.89);Muscle weakness (generalized) (M62.81);Pain Pain - Right/Left: Right Pain - part of body: Hip;Shoulder    Time: 1120-1208 PT Time Calculation (min) (ACUTE ONLY): 48 min   Charges:   PT Evaluation $PT Eval Moderate Complexity: 1 Mod PT Treatments $Therapeutic Activity: 8-22 mins $Self Care/Home Management: 8-22        Floria Raveling. Hartnett-Rands, MS, PT Per [place] [id number] 03/19/2019, 12:23 PM

## 2019-03-20 ENCOUNTER — Inpatient Hospital Stay
Admission: RE | Admit: 2019-03-20 | Discharge: 2019-04-06 | Disposition: A | Discharge status: Home / Self Care | Payer: Medicare Other | Source: Ambulatory Visit | Attending: Internal Medicine | Admitting: Internal Medicine

## 2019-03-20 DIAGNOSIS — R600 Localized edema: Secondary | ICD-10-CM

## 2019-03-20 DIAGNOSIS — R531 Weakness: Secondary | ICD-10-CM

## 2019-03-20 DIAGNOSIS — R609 Edema, unspecified: Secondary | ICD-10-CM

## 2019-03-20 LAB — GLUCOSE, CAPILLARY
Glucose-Capillary: 135 mg/dL — ABNORMAL HIGH (ref 70–99)
Glucose-Capillary: 121 mg/dL — ABNORMAL HIGH (ref 70–99)

## 2019-03-20 LAB — BASIC METABOLIC PANEL
Anion gap: 12 (ref 5–15)
BUN: 24 mg/dL — ABNORMAL HIGH (ref 8–23)
CO2: 26 mmol/L (ref 22–32)
Calcium: 8.8 mg/dL — ABNORMAL LOW (ref 8.9–10.3)
Chloride: 105 mmol/L (ref 98–111)
Creatinine, Ser: 0.98 mg/dL (ref 0.44–1.00)
GFR calc Af Amer: 58 mL/min — ABNORMAL LOW (ref >60)
GFR calc non Af Amer: 50 mL/min — ABNORMAL LOW (ref >60)
Glucose, Bld: 147 mg/dL — ABNORMAL HIGH (ref 70–99)
Potassium: 4.2 mmol/L (ref 3.5–5.1)
Sodium: 143 mmol/L (ref 135–145)

## 2019-03-20 LAB — CBC
HCT: 36 % (ref 36.0–46.0)
Hemoglobin: 10.8 g/dL — ABNORMAL LOW (ref 12.0–15.0)
MCH: 29.4 pg (ref 26.0–34.0)
MCHC: 30 g/dL (ref 30.0–36.0)
MCV: 98.1 fL (ref 80.0–100.0)
Platelets: 249 10*3/uL (ref 150–400)
RBC: 3.67 MIL/uL — ABNORMAL LOW (ref 3.87–5.11)
RDW: 14.5 % (ref 11.5–15.5)
WBC: 6.9 10*3/uL (ref 4.0–10.5)
nRBC: 0 % (ref 0.0–0.2)

## 2019-03-20 LAB — SARS CORONAVIRUS 2 (TAT 6-24 HRS): SARS Coronavirus 2: NEGATIVE

## 2019-03-20 LAB — STREP PNEUMONIAE URINARY ANTIGEN: Strep Pneumo Urinary Antigen: NEGATIVE

## 2019-03-20 MED ORDER — CEFDINIR 300 MG PO CAPS: 300.0000 mg | ORAL_CAPSULE | Freq: Two times a day (BID) | ORAL | 0 refills | Status: DC

## 2019-03-20 MED ORDER — ALPRAZOLAM 0.5 MG PO TABS: 0.5000 mg | ORAL_TABLET | ORAL | 0 refills | Status: DC | PRN

## 2019-03-20 MED ORDER — HYDROCODONE-ACETAMINOPHEN 5-325 MG PO TABS: 1.0000 | ORAL_TABLET | Freq: Three times a day (TID) | ORAL | 0 refills | Status: DC | PRN

## 2019-03-20 NOTE — Care Management Important Message (Signed)
Important Message  Patient Details  Name: Amanda Hale MRN: [id number] Date of Birth: [birth year]   Medicare Important Message Given:  Yes     Tommy Medal 03/20/2019, 3:36 PM

## 2019-03-20 NOTE — NC FL2 (Signed)
[place] LEVEL OF CARE SCREENING TOOL     IDENTIFICATION  Patient Name: Amanda Hale Birthdate: [birth year] Sex: female Admission Date (Current Location): 03/17/2019  [place] and [place] Number:  [place] [id number] [place] and Address:  [facility] [address], [place]      Provider Number: [id number]  Attending Physician Name and Address:  Barton Dubois, MD  Relative Name and Phone Number:  DTRRodena [phone]    Current Level of Care: Hospital Recommended Level of Care: [place] Prior Approval Number:    Date Approved/Denied:   PASRR Number:    Discharge Plan: SNF    Current Diagnoses: Patient Active Problem List   Diagnosis Date Noted  . Acute lower UTI 03/18/2019  . CAP (community acquired pneumonia) 03/18/2019  . Dysphagia 02/23/2018  . Diarrhea 02/23/2018  . Pressure injury of skin 12/27/2017  . Atrial fibrillation with RVR ([place]) 12/24/2017  . Chronic diastolic heart failure ([place]) 12/24/2017  . Periumbilical discomfort [id number]  . Nausea with vomiting 05/05/2017  . Pyloric stenosis, acquired 11/06/2015  . Congenital hypertrophic pyloric stenosis   . Upper abdominal pain 09/13/2015  . Loss of weight 09/13/2015  . GERD (gastroesophageal reflux disease) 09/13/2015  . Esophageal motility disorder 02/21/2013  . Cough 02/21/2013  . Esophageal dysphagia 08/11/2012  . Chronic constipation 08/11/2012  . Personal history of colonic polyps 08/11/2012  . Chest pain 07/27/2012  . HTN (hypertension) 07/26/2012  . Diabetes mellitus, type II ([place]) 07/26/2012  . Dyslipidemia 07/26/2012    Orientation RESPIRATION BLADDER Height & Weight     Self  Normal External catheter Weight: 69.1 kg Height:  5\' 1"  (154.9 cm)  BEHAVIORAL SYMPTOMS/MOOD NEUROLOGICAL BOWEL NUTRITION STATUS      Continent Diet  AMBULATORY STATUS COMMUNICATION OF NEEDS Skin   Limited Assist Verbally Normal                        Personal Care Assistance Level of Assistance  Bathing, Feeding, Dressing Bathing Assistance: Limited assistance Feeding assistance: Independent Dressing Assistance: Limited assistance     Functional Limitations Info  Sight, Hearing, Speech Sight Info: Adequate Hearing Info: Adequate Speech Info: Adequate    SPECIAL CARE FACTORS FREQUENCY  PT (By licensed PT)     PT Frequency: 5x/week              Contractures Contractures Info: Not present    Additional Factors Info  Code Status, Allergies, Psychotropic Code Status Info: DNR Allergies Info: NKA Psychotropic Info: xanax, aricpet, namenda         Current Medications (03/20/2019):  This is the current hospital active medication list Current Facility-Administered Medications  Medication Dose Route Frequency Provider Last Rate Last Dose  . apixaban (ELIQUIS) tablet 5 mg  5 mg Oral BID Barton Dubois, MD   5 mg at 03/20/19 [id number]  . cefTRIAXone (ROCEPHIN) 1 g in sodium chloride 0.9 % 100 mL IVPB  1 g Intravenous Q24H Jani Gravel, MD 200 mL/hr at 03/19/19 2212 1 g at 03/19/19 2212  . donepezil (ARICEPT) tablet 10 mg  10 mg Oral QHS Barton Dubois, MD   10 mg at 03/19/19 2208  . furosemide (LASIX) tablet 20 mg  20 mg Oral Daily Barton Dubois, MD   20 mg at 03/20/19 0904  . HYDROcodone-acetaminophen (NORCO/VICODIN) 5-325 MG per tablet 1 tablet  1 tablet Oral Q8H PRN Barton Dubois, MD   1 tablet at 03/20/19 1246  .  insulin aspart (novoLOG) injection 0-5 Units  0-5 Units Subcutaneous QHS Jani Gravel, MD   1 Units at 03/18/19 1030  . insulin aspart (novoLOG) injection 0-9 Units  0-9 Units Subcutaneous TID WC Jani Gravel, MD   1 Units at 03/20/19 1150  . memantine (NAMENDA) tablet 5 mg  5 mg Oral BID Barton Dubois, MD   5 mg at 03/20/19 0904  . metoprolol tartrate (LOPRESSOR) tablet 75 mg  75 mg Oral BID Barton Dubois, MD   75 mg at 03/20/19 [id number]     Discharge Medications: Please see discharge summary for a list of  discharge medications.  Relevant Imaging Results:  Relevant Lab Results:   Additional Information SSN [id number]  Haifa Hatton, Chauncey [place], RN

## 2019-03-20 NOTE — Progress Notes (Signed)
Report called to the [facility]. Patient discharged from room via wheelchair accompanied by [facility] staff, prescriptions and AVS sent with patient.

## 2019-03-20 NOTE — Discharge Summary (Signed)
Physician Discharge Summary  Amanda Hale [id number] DOB: March 12, 1980 DOA: 03/17/2019  PCP: Celene Squibb, MD  Admit date: 03/17/2019 Discharge date: 03/20/2019  Time spent: 35 minutes  Recommendations for Outpatient Follow-up:  1. Repeat basic metabolic panel in 1 week to follow electrolytes renal function 2. Reassess volume status and if needed adjust diuretic regimen. 3. Follow final urine culture/sensitivity report and adjust antibiotic therapy as required.   Discharge Diagnoses:  Principal Problem:   Acute lower UTI Active Problems:   HTN (hypertension)   Diabetes mellitus, type II (HCC)   GERD (gastroesophageal reflux disease)   Chronic diastolic heart failure (HCC)   Peripheral edema   Weakness   Discharge Condition: Stable and improved.  Patient discharged to a skilled nursing facility for further care and rehabilitation.  Patient is DNR.  Follow-up with PCP in 2 weeks after discharge from skilled nursing facility.  Diet recommendation: Heart healthy diet.  Filed Weights   03/17/19 2040 03/20/19 0531  Weight: 68 kg 69.1 kg    Brief history of present illness:  85 y.o.female,w hypertension, hyperlipidemia, Dm2, Gerd/ Schatzki's ring, who presents with c/o generalized weakness. Pt daughter notes that her fluid medication was increased for lower extremity edema about 1 week ago. Pt denies headache, focal weakness, numbness, tingling.   In ED,  T 97.8 P 61, R 15, Bp 115/57 Pox 98% on RA Wt 68 kg  CXR IMPRESSION: Small left pleural effusion and associated left lung base atelectasis versus infiltrate, similar or slightly increased since the prior radiograph.  Hospital Course:  1-Acute lower E. coli UTI -Excellent response to the use of IV Rocephin -No nausea, no vomiting, no fever, normal WBCs -Patient denies dysuria -Patient will be discharged on cefdinir to complete antibiotic therapy (5 more days at discharge). -Follow final culture results and  make any adjustment to antibiotic therapy as required. -Advised to maintain adequate hydration.  2-HTN (hypertension) -Stable -Continue current antihypertensive medications.  3-Diabetes mellitus, type II ([place]) -Resume home oral hypoglycemic regimen -Follow-up patient's CBGs/A1c for further adjustment to medications as required.  4-GERD (gastroesophageal reflux disease) -Continue PPI  5-Chronic diastolic heart failure (HCC) -Overall compensated -Continue Lasix -Encouraged to follow low-sodium diet -Follow daily weights and adequate hydration.  6-pleural effusion -Appears to be stable -CTA demonstrating chronic bronchitic changes, but not acute infiltrates. -patient afebrile, normal WBCs and no complaining of coughing or shortness of breath. -Treatment for acute UTI will also assist with any bronchitis component.  7-dementia -Mild -Continue Aricept and Namenda -No behavioral disturbances appreciated at this point.  8-general weakness and deconditioning -physical therapy has recommended home health PT and 24 hour assistance; but family unable to assist currently. Will discharge to SNF rehab. -Weakness most likely associated with dehydration and acute UTI   Procedures:  See below for x-ray reports.  Consultations:  None  Discharge Exam: Vitals:   03/20/19 0531 03/20/19 1437  BP: 121/63 104/60  Pulse: (!) 113 81  Resp: 18 16  Temp: 98.1 F (36.7 C) 98.3 F (36.8 C)  SpO2: 96% 96%    General: Afebrile, no chest pain, no nausea, no vomiting.  Feeling weak and deconditioned but overall better.  Denies dysuria currently. Cardiovascular: Rate controlled, no rubs, no gallops, no JVD. Respiratory: Good air movement bilaterally, no wheezing, no crackles, normal respiratory effort.  Good O2 sat on room air. Abdomen: Soft, nontender, nondistended, positive bowel sounds Extremities: Trace edema bilaterally appreciated on exam; no cyanosis or clubbing.  Discharge  Instructions   Discharge  Instructions    (HEART FAILURE PATIENTS) Call MD:  Anytime you have any of the following symptoms: 1) 3 pound weight gain in 24 hours or 5 pounds in 1 week 2) shortness of breath, with or without a dry hacking cough 3) swelling in the hands, feet or stomach 4) if you have to sleep on extra pillows at night in order to breathe.   Complete by: As directed    Diet - low sodium heart healthy   Complete by: As directed    Discharge instructions   Complete by: As directed    Take medications as prescribed Maintain adequate hydration 5 more days of abx's Rehabilitation and PT as per SNF protocol Heart healthy/low sodium diet and daily weights.     Allergies as of 03/20/2019   No Known Allergies     Medication List    TAKE these medications   ALPRAZolam 0.5 MG tablet Commonly known as: XANAX Take 1 tablet (0.5 mg total) by mouth as needed for anxiety. What changed: reasons to take this   cefdinir 300 MG capsule Commonly known as: OMNICEF Take 1 capsule (300 mg total) by mouth 2 (two) times daily for 5 days.   diltiazem 120 MG 24 hr capsule Commonly known as: CARDIZEM CD Take 1 capsule (120 mg total) by mouth daily.   donepezil 10 MG tablet Commonly known as: ARICEPT Take 10 mg by mouth at bedtime.   Eliquis 2.5 MG Tabs tablet Generic drug: apixaban TAKE 1 TABLET BY MOUTH TWICE DAILY   furosemide 20 MG tablet Commonly known as: LASIX TAKE 1 TABLET BY MOUTH DAILY AS NEEDED FOR swelling   HYDROcodone-acetaminophen 5-325 MG tablet Commonly known as: NORCO/VICODIN Take 1 tablet by mouth every 8 (eight) hours as needed for severe pain. for pain What changed: reasons to take this   meclizine 25 MG tablet Commonly known as: ANTIVERT Take 25 mg by mouth 4 (four) times daily as needed. Take 2 tabs po qam and 2 tabs po qpm.   memantine 5 MG tablet Commonly known as: NAMENDA Take 5 mg by mouth 2 (two) times daily.   metoprolol tartrate 50 MG  tablet Commonly known as: LOPRESSOR Take 50 mg by mouth 2 (two) times daily. = 75mg  total   metoprolol tartrate 25 MG tablet Commonly known as: LOPRESSOR Take 25 mg by mouth 2 (two) times daily. = 75mg  total   PRESERVISION AREDS 2 PO Take 1 tablet by mouth 2 (two) times daily.   risperiDONE 0.25 MG tablet Commonly known as: RISPERDAL Take 0.25-0.5 mg by mouth at bedtime.      No Known Allergies  Contact information for follow-up providers    Celene Squibb, MD. Schedule an appointment as soon as possible for a visit in 2 week(s).   Specialty: Internal Medicine Why: after discharge from SNF Contact information: [facility] [id number] [phone]        Arnoldo Lenis, MD .   Specialty: Cardiology Contact information: [address] [place] [address] [phone]            Contact information for after-discharge care    [facility] .   Service: Skilled Nursing Contact information: [address] [place] [phone]                  The results of significant diagnostics from this hospitalization (including imaging, microbiology, ancillary and laboratory) are  listed below for reference.    Significant Diagnostic Studies: Dg Chest 2 View  Result Date: 03/17/2019 CLINICAL DATA:  85 year old female with weakness. EXAM: CHEST - 2 VIEW COMPARISON:  Chest radiograph dated 12/24/2017 and CT dated 08/08/2015 FINDINGS: Small left pleural effusion and associated left lung base atelectasis versus infiltrate, similar or slightly increased since the prior radiograph. The right lung is clear. There is no pneumothorax. Stable top-normal cardiac size. Atherosclerotic calcification of the aorta. Osteopenia with degenerative changes of the spine. No acute osseous pathology. IMPRESSION: Small left pleural effusion and associated left lung base atelectasis versus infiltrate,  similar or slightly increased since the prior radiograph. Electronically Signed   By: Anner Crete M.D.   On: 03/17/2019 22:24   Ct Head Wo Contrast  Result Date: 03/18/2019 CLINICAL DATA:  Weakness. EXAM: CT HEAD WITHOUT CONTRAST TECHNIQUE: Contiguous axial images were obtained from the base of the skull through the vertex without intravenous contrast. COMPARISON:  07/09/2018 FINDINGS: Brain: No evidence of acute infarction, hemorrhage, hydrocephalus, extra-axial collection or mass lesion/mass effect. Again noted is atrophy and advanced chronic microvascular ischemic changes. Vascular: No hyperdense vessel or unexpected calcification. Skull: Normal. Negative for fracture or focal lesion. Sinuses/Orbits: No acute finding. Other: None. IMPRESSION: No acute intracranial abnormality. Electronically Signed   By: Constance Holster M.D.   On: 03/18/2019 02:29   Ct Chest Wo Contrast  Result Date: 03/18/2019 CLINICAL DATA:  Pleural effusion. EXAM: CT CHEST WITHOUT CONTRAST TECHNIQUE: Multidetector CT imaging of the chest was performed following the standard protocol without IV contrast. COMPARISON:  CT August 08, 2015. FINDINGS: Cardiovascular: The heart size is enlarged with significant biatrial enlargement. Coronary artery calcifications are noted. Thoracic aortic calcifications are noted. There is no significant pericardial effusion. Mediastinum/Nodes: --No mediastinal or hilar lymphadenopathy. --No axillary lymphadenopathy. --No supraclavicular lymphadenopathy. --Normal thyroid gland. --The esophagus is unremarkable Lungs/Pleura: There is a stable 3 mm pulmonary nodule in the right middle lobe (axial series 4, image 78). There are trace bilateral pleural effusions. There is no pneumothorax. Upper Abdomen: There is cholelithiasis involving the partially visualized gallbladder. Musculoskeletal: No chest wall abnormality. No acute or significant osseous findings. Review of the MIP images confirms the above  findings. IMPRESSION: 1. Trace bilateral pleural effusions. 2. No focal infiltrate. 3. There is cholelithiasis without secondary signs of acute cholecystitis. Aortic Atherosclerosis (ICD10-I70.0). Electronically Signed   By: Constance Holster M.D.   On: 03/18/2019 02:34    Microbiology: Recent Results (from the past 240 hour(s))  Urine Culture     Status: Abnormal (Preliminary result)   Collection Time: 03/18/19 12:12 AM   Specimen: Urine, Clean Catch  Result Value Ref Range Status   Specimen Description   Final    URINE, CLEAN CATCH Performed at [facility], [address] [place], [place] [address]    Special Requests   Final    NONE Performed at [facility], [address], [place], [place] [address]    Culture (A)  Final    >=100,000 COLONIES/mL ESCHERICHIA COLI SUSCEPTIBILITIES TO FOLLOW Performed at [facility] [address] [place], [place] [address]    Report Status PENDING  Incomplete  Culture, blood (routine x 2) Call MD if unable to obtain prior to antibiotics being given     Status: None (Preliminary result)   Collection Time: 03/18/19  2:09 AM   Specimen: BLOOD LEFT HAND  Result Value Ref Range Status   Specimen Description BLOOD LEFT HAND  Final   Special Requests  Final    BOTTLES DRAWN AEROBIC ONLY Blood Culture results may not be optimal due to an inadequate volume of blood received in culture bottles   Culture   Final    NO GROWTH 2 DAYS Performed at [facility], [address], [place], [place] [address]    Report Status PENDING  Incomplete  Culture, blood (routine x 2) Call MD if unable to obtain prior to antibiotics being given     Status: None (Preliminary result)   Collection Time: 03/18/19  2:15 AM   Specimen: Right Antecubital; Blood  Result Value Ref Range Status   Specimen Description RIGHT ANTECUBITAL  Final   Special Requests   Final    BOTTLES DRAWN AEROBIC AND ANAEROBIC Blood Culture adequate volume   Culture   Final    NO GROWTH  2 DAYS Performed at [facility], [address], [place], [place] [address]    Report Status PENDING  Incomplete  SARS CORONAVIRUS 2 (TAT 6-24 HRS) Nasopharyngeal Nasopharyngeal Swab     Status: None   Collection Time: 03/19/19  2:50 PM   Specimen: Nasopharyngeal Swab  Result Value Ref Range Status   SARS Coronavirus 2 NEGATIVE NEGATIVE Final    Comment: (NOTE) SARS-CoV-2 target nucleic acids are NOT DETECTED. The SARS-CoV-2 RNA is generally detectable in upper and lower respiratory specimens during the acute phase of infection. Negative results do not preclude SARS-CoV-2 infection, do not rule out co-infections with other pathogens, and should not be used as the sole basis for treatment or other patient management decisions. Negative results must be combined with clinical observations, patient history, and epidemiological information. The expected result is Negative. Fact Sheet for Patients: SugarRoll.be Fact Sheet for Healthcare Providers: https://www.woods-mathews.com/ This test is not yet approved or cleared by the [place] FDA and  has been authorized for detection and/or diagnosis of SARS-CoV-2 by FDA under an Emergency Use Authorization (EUA). This EUA will remain  in effect (meaning this test can be used) for the duration of the COVID-19 declaration under Section 56 4(b)(1) of the Act, 21 U.S.C. section 360bbb-3(b)(1), unless the authorization is terminated or revoked sooner. Performed at [facility], [place] [address] [place], [place] [address]      Labs: Basic Metabolic Panel: Recent Labs  Lab 03/17/19 2101 03/18/19 0215 03/20/19 0450  NA 143 144 143  K 3.8 3.8 4.2  CL 104 107 105  CO2 30 30 26   GLUCOSE 202* 111* 147*  BUN 24* 26* 24*  CREATININE 1.01* 0.89 0.98  CALCIUM 9.3 9.1 8.8*   Liver Function Tests: Recent Labs  Lab 03/18/19 0215  AST 100*  ALT 65*  ALKPHOS 102  BILITOT 0.4  PROT 6.1*   ALBUMIN 3.2*   CBC: Recent Labs  Lab 03/17/19 2101 03/18/19 0215 03/20/19 0450  WBC 6.2 5.9 6.9  HGB 10.6* 11.2* 10.8*  HCT 35.5* 37.0 36.0  MCV 98.9 98.9 98.1  PLT 238 225 249   BNP (last 3 results) Recent Labs    03/17/19 2101  BNP 240.0*    CBG: Recent Labs  Lab 03/19/19 1246 03/19/19 1646 03/19/19 2126 03/20/19 0736 03/20/19 1140  GLUCAP 178* 100* 181* 135* 121*     Signed:  Barton Dubois MD.  [facility] 03/20/2019, 3:17 PM

## 2019-03-20 NOTE — TOC Transition Note (Signed)
Transition of Care [facility]) - CM/SW Discharge Note   Patient Details  Name: Amanda Hale MRN: [id number] Date of Birth: 1926/02/21  [facility]) CM/SW Contact:  Lonzie Simmer, Chauncey [place], RN Phone Number: 03/20/2019, 3:18 PM   Clinical Narrative:   DC to [facility]. Clinicals sent.       Barriers to Discharge: SNF Pending bed offer, Family Issues   Patient Goals and CMS Choice Patient states their goals for this hospitalization and ongoing recovery are:: daughter would like SNF for rehab and return home CMS Medicare.gov Compare Post Acute Care list provided to:: Other (Comment Required)(daughter) Choice offered to / list presented to : Adult Children  Discharge Placement              Patient chooses bed at: [facility] Patient to be transferred to facility by: [facility] staff Name of family member notified: [name] Patient and family notified of of transfer: 03/20/19  Discharge Plan and Services   Discharge Planning Services: CM Consult Post Acute Care Choice: [place]                               Social Determinants of Health (SDOH) Interventions     Readmission Risk Interventions No flowsheet data found.

## 2019-03-20 NOTE — TOC Initial Note (Addendum)
Transition of Care [facility]) - Initial/Assessment Note    Patient Details  Name: Amanda Hale MRN: [id number] Date of Birth: 13-Nov-1925  Transition of Care [facility]) CM/SW Contact:    Jendayi Berling, Chauncey Reading, RN Phone Number: 03/20/2019, 10:44 AM  Clinical Narrative:      Patient admitted with confusion, UTI and pneumonia. From home with daughter. Seen by PT, recommended for home with [facility] PT and supervision. She has aide [place] 10:30-3:30. Long discussion with daughter, she reports she has back problems and even if patient comes home with [facility] PT, patient will only participate with PT and then daughter struggles to care for her when she is weak like she is currently. Daughter would like to pursue short term rehab and take patient home.   We discuss long term care options. If patient continues to decline, she may wish to transition from a short term rehab to long term. Patient does have Medicare and Medicaid. Daughter wants to discuss long term care plan with family and PCP. She reports CAP aide service can be increased to 30 hours if needed, currently only utilizing only 15 hours.   Daughter is concerned that she will not get to visit patient while at rehab but feels it is needed at this time. Will send referrals and discuss bed offers with daughter when available.          Expected Discharge Plan: Skilled Nursing Facility Barriers to Discharge: SNF Pending bed offer, Family Issues   Patient Goals and CMS Choice Patient states their goals for this hospitalization and ongoing recovery are:: daughter would like SNF for rehab and return home CMS Medicare.gov Compare Post Acute Care list provided to:: Other (Comment Required)(daughter) Choice offered to / list presented to : Adult Children  Expected Discharge Plan and Services Expected Discharge Plan: [place]   Discharge Planning Services: CM Consult Post Acute Care Choice: [place] Living arrangements for the past 2  months: Single Family Home                       Prior Living Arrangements/Services Living arrangements for the past 2 months: Single Family Home Lives with:: Adult Children Patient language and need for interpreter reviewed:: Yes        Need for Family Participation in Patient Care: Yes (Comment) Care giver support system in place?: Yes (comment) Current home services: DME Criminal Activity/Legal Involvement Pertinent to Current Situation/Hospitalization: No - Comment as needed  Activities of Daily Living Home Assistive Devices/Equipment: Environmental consultant (specify type), Shower chair with back, Eyeglasses ADL Screening (condition at time of admission) Patient's cognitive ability adequate to safely complete daily activities?: Yes Is the patient deaf or have difficulty hearing?: Yes Does the patient have difficulty seeing, even when wearing glasses/contacts?: No Does the patient have difficulty concentrating, remembering, or making decisions?: No Patient able to express need for assistance with ADLs?: Yes Does the patient have difficulty dressing or bathing?: Yes Independently performs ADLs?: No Communication: Needs assistance Is this a change from baseline?: Pre-admission baseline Dressing (OT): Needs assistance Is this a change from baseline?: Pre-admission baseline Grooming: Needs assistance Is this a change from baseline?: Pre-admission baseline Feeding: Independent Bathing: Needs assistance Is this a change from baseline?: Pre-admission baseline Toileting: Needs assistance Is this a change from baseline?: Pre-admission baseline In/Out Bed: Independent with device (comment) Is this a change from baseline?: Pre-admission baseline Walks in Home: Independent with device (comment) Does the patient have difficulty walking or climbing stairs?:  Yes Weakness of Legs: Both Weakness of Arms/Hands: None     Admission diagnosis:  Peripheral edema [R60.9] Weakness [R53.1] Acute cystitis  without hematuria [N30.00] Community acquired pneumonia of left lower lobe of lung [J18.9] CAP (community acquired pneumonia) [J18.9] Patient Active Problem List   Diagnosis Date Noted  . Acute lower UTI 03/18/2019  . CAP (community acquired pneumonia) 03/18/2019  . Dysphagia 02/23/2018  . Diarrhea 02/23/2018  . Pressure injury of skin 12/27/2017  . Atrial fibrillation with RVR ([place]) 12/24/2017  . Chronic diastolic heart failure ([place]) 12/24/2017  . Periumbilical discomfort [id number]  . Nausea with vomiting 05/05/2017  . Pyloric stenosis, acquired 11/06/2015  . Congenital hypertrophic pyloric stenosis   . Upper abdominal pain 09/13/2015  . Loss of weight 09/13/2015  . GERD (gastroesophageal reflux disease) 09/13/2015  . Esophageal motility disorder 02/21/2013  . Cough 02/21/2013  . Esophageal dysphagia 08/11/2012  . Chronic constipation 08/11/2012  . Personal history of colonic polyps 08/11/2012  . Chest pain 07/27/2012  . HTN (hypertension) 07/26/2012  . Diabetes mellitus, type II ([place]) 07/26/2012  . Dyslipidemia 07/26/2012   PCP:  Celene Squibb, MD Pharmacy:   [facility], [place] [id number] [place] [id number] Phone: [phone] Fax: [phone]     Social Determinants of Health (SDOH) Interventions    Readmission Risk Interventions No flowsheet data found.

## 2019-03-20 NOTE — Care Management (Signed)
Map marker for [place]  [place]  [place], [place] [address] [phone]        [place]  to My [place] in a new window    5 out of 5 starsfootnote   Much Above Average 5 out of 5 starsfootnote   Much Above Average 3 out of 5 starsfootnote   Average 3 out of 5 starsfootnote   Average 0.3  [place]    Map marker for [place]  [place]  [place], [place] [address] [phone]        [place]  to My [place]avorites- [place] in a new window    2 out of 5 starsfootnote   Below Average 2 out of 5 starsfootnote   Below Average 2 out of 5 starsfootnote   Below Average 2 out of 5 starsfootnote   Below Average 0.4  [employer] marker for [place]  [address] [place], [place] [address] [phone]        Add [facility]  to My [place] in a new window    4 out of 5 starsfootnote   Above Average 4 out of 5 starsfootnote   Above Average 3 out of 5 starsfootnote   Average 4 out of 5 starsfootnote   Above Average 13.6  [employer] marker for [place]  [place], [place] [address] [phone]        [facility]  to My [place] in a new window    3 out of 5 starsfootnote   Average 3 out of 5 starsfootnote   Average Not Available45footnote 3 out of 5 starsfootnote   Average 15.4  [employer] marker for [place]  [place], [place] [address] [phone]        [place]  to My Favorites- [place] in a new window    1 out of 5 starsfootnote   Much Below  Average 2 out of 5 starsfootnote   Below Average 1 out of 5 starsfootnote   Much Below Average 3 out of 5 starsfootnote   Average 16.7  [employer] marker for [place]  [place], [place] [address] [phone]        [facility]  to My [place] in a new window    3 out of 5 starsfootnote   Average 3 out of 5 starsfootnote   Average 2 out of 5 starsfootnote   Below Average 2 out of 5 starsfootnote   Below Average 22.6  [employer] marker for [place]  [address]  [place], [place] [address] [phone]        [place]  to My [place] in a new window    1 out of 5 starsfootnote   Much Below Average 1 out of 5  starsfootnote   Much Below Average 2 out of 5 starsfootnote   Below Average 2 out of 5 starsfootnote   Below Average 23.2  [employer] marker for [place]  [address]  [place], [place] [address] [phone]        Add [place]  to My Favorites- [place] in a new window    4 out of 5 starsfootnote   Above Average 3 out of 5 starsfootnote   Average 2 out of 5 starsfootnote   Below Average 5 out of 5 starsfootnote   Much Above Average 23.3  [employer] marker for [place]  [place]  [place], [place] [address] [phone]        [place]  to My Favorites- [place] in a new window    1 out of 5 starsfootnote   Much Below Average 1 out of 5 starsfootnote   Much Below Average 2 out of 5 starsfootnote   Below Average 2 out of 5 starsfootnote   Below Average 23.3  [employer] marker for [place]   [address] [place], [place] [address] [phone]        [place]  to My Favorites- [place] in a new window    5 out of 5 starsfootnote   Much Above Average 5 out of 5 starsfootnote   Much Above Average Not Available55footnote 2 out of 5 starsfootnote   Below Average 24.5  [employer] marker for [place]  [address]  [place], [place] [address] [phone]        [place]  to My Favorites- [place] in a new window    1 out of 5 starsfootnote   Much Below Average 2 out of 5 starsfootnote   Below Average 1 out of 5 starsfootnote   Much Below Average 3 out of 5 starsfootnote   Average

## 2019-03-20 NOTE — TOC Transition Note (Signed)
Transition of Care [facility]) - CM/SW Discharge Note   Patient Details  Name: Amanda Hale MRN: [id number] Date of Birth: [birth year]  [facility]) CM/SW Contact:  Danella Philson, [place], RN Phone Number: 03/20/2019, 2:10 PM   Clinical Narrative:   Patient/daughter has accepted bed offer at [facility] Patient to DC today. Will send DC clinicals when available. Bedside RN to call report.       Barriers to Discharge: SNF Pending bed offer, Family Issues   Patient Goals and CMS Choice Patient states their goals for this hospitalization and ongoing recovery are:: daughter would like SNF for rehab and return home CMS Medicare.gov Compare Post Acute Care list provided to:: Other (Comment Required)(daughter) Choice offered to / list presented to : Adult Children  Discharge Placement                       Discharge Plan and Services   Discharge Planning Services: CM Consult Post Acute Care Choice: Skilled Nursing Facility                               Social Determinants of Health (SDOH) Interventions     Readmission Risk Interventions No flowsheet data found.

## 2019-03-20 NOTE — Progress Notes (Signed)
Physical Therapy Treatment Patient Details Name: Amanda Hale MRN: [id number] DOB: [birth year] Today's Date: 03/20/2019    History of Present Illness Patient is a 85 year old female admittied to the ED 03/17/2019 with complaints of generalized weakness and diagnosis of acute lower UTI and CAP. PMH: w hypertension, hyperlipidemia, Dm2, Gerd/ Schatzki's ring, hiatal hernia, osteoporosis, arthritis, tubular adenoma, hip surgery x2 right. A and O x3.    PT Comments    Pt ambulated x 20 ft with supervision, then she was assisted to restroom. PT had BM, after washing hands she was fatigued therefore ambulation was limited.  PT give toothbrush and washcloth and was able to wash her face/ brush teeth on own but unable to do so standing.   Follow Up Recommendations  Home health PT;Supervision/Assistance - 24 hour     Equipment Recommendations  Other (comment)(HHPT assessment of shower access and safety)    Recommendations for Other Services       Precautions / Restrictions Precautions Precautions: None Restrictions Weight Bearing Restrictions: No    Mobility  Bed Mobility Overal bed mobility: Modified Independent Bed Mobility: Supine to Sit     Supine to sit: Modified independent (Device/Increase time)     General bed mobility comments: slow, labored effort to get in/out of bed; commented gets stuck at home sometimes  Transfers Overall transfer level: Needs assistance Equipment used: Rolling walker (2 wheeled) Transfers: Sit to/from [facility] Sit to Stand: Min assist Stand pivot transfers: Supervision          Ambulation/Gait Ambulation/Gait assistance: Supervision Gait Distance (Feet): 70 Feet Assistive device: Rolling walker (2 wheeled) Gait Pattern/deviations: Step-through pattern;Decreased step length - right;Decreased step length - left;Decreased stride length;Trunk flexed;Wide base of support Gait velocity: decreased Gait velocity  interpretation: <1.8 ft/sec, indicate of risk for recurrent falls General Gait Details: slow, labored gait, limited by fatigue;   Stairs             Wheelchair Mobility    Modified Rankin (Stroke Patients Only)       Balance                                            Cognition Arousal/Alertness: Awake/alert Behavior During Therapy: WFL for tasks assessed/performed Overall Cognitive Status: Within Functional Limits for tasks assessed                                        Exercises General Exercises - Lower Extremity Ankle Circles/Pumps: Both;10 reps Quad Sets: Both;10 reps Long Arc Quad: Both;10 reps Heel Slides: Both;5 reps Straight Leg Raises: Both;10 reps    General Comments        Pertinent Vitals/Pain Pain Assessment: 0-10 Pain Score: 6  Pain Location: both knees Pain Descriptors / Indicators: Aching Pain Intervention(s): Limited activity within patient's tolerance    Home Living Family/patient expects to be discharged to:: Private residence Living Arrangements: Children(65 yr old daughter unable to assist much due to back pain) Available Help at Discharge: Personal care attendant;Available PRN/intermittently(3 days/wk possibly 5 hours/day) Type of Home: House Home Access: Level entry   Home Layout: One level Home Equipment: Shower seat;Bedside commode;Walker - 2 wheels;Walker - 4 wheels;Cane - single point;Wheelchair - manual;Grab bars - tub/shower;Grab bars - toilet(grab bars in shower are suction  cup kind)      Prior Function Level of Independence: Needs assistance  Gait / Transfers Assistance Needed: househild distances with 4 wheeled walker ADL's / Homemaking Assistance Needed: assistance with ADL's and IADL's Comments: daughter or personal assistant with patient at all times   PT Goals (current goals can now be found in the care plan section) Acute Rehab PT Goals Patient Stated Goal: get help making shower  entry safer with sturdy grab bars PT Goal Formulation: With patient Time For Goal Achievement: 04/02/19 Potential to Achieve Goals: Fair    Frequency    Min 3X/week      PT Plan  SNF/HH    M-PAC PT "6 Clicks" Mobility   Outcome Measure  Help needed turning from your back to your side while in a flat bed without using bedrails?: A Little Help needed moving from lying on your back to sitting on the side of a flat bed without using bedrails?: A Little Help needed moving to and from a bed to a chair (including a wheelchair)?: A Little Help needed standing up from a chair using your arms (e.g., wheelchair or bedside chair)?: A Little Help needed to walk in hospital room?: A Little Help needed climbing 3-5 steps with a railing? : A Lot 6 Click Score: 17    End of Session Equipment Utilized During Treatment: Gait belt Activity Tolerance: Patient tolerated treatment well;Patient limited by fatigue Patient left: in bed;with call bell/phone within reach Nurse Communication: Mobility status;Patient requests pain meds PT Visit Diagnosis: Unsteadiness on feet (R26.81);Other abnormalities of gait and mobility (R26.89);Muscle weakness (generalized) (M62.81);Pain Pain - Right/Left: Right Pain - part of body: Hip;Shoulder     Time: 1100-1140 PT Time Calculation (min) (ACUTE ONLY): 40 min  Charges:  $Gait Training: 8-22 mins $Therapeutic Exercise: 8-22 mins                        Rayetta Humphrey, PT CLT [phone] 03/20/2019, 11:41 AM

## 2019-03-21 ENCOUNTER — Non-Acute Institutional Stay (SKILLED_NURSING_FACILITY): Payer: Medicare Other | Admitting: Adult Health

## 2019-03-21 ENCOUNTER — Encounter: Payer: Self-pay | Admitting: Adult Health

## 2019-03-21 DIAGNOSIS — E1159 Type 2 diabetes mellitus with other circulatory complications: Secondary | ICD-10-CM | POA: Diagnosis not present

## 2019-03-21 DIAGNOSIS — I5032 Chronic diastolic (congestive) heart failure: Secondary | ICD-10-CM

## 2019-03-21 DIAGNOSIS — I4891 Unspecified atrial fibrillation: Secondary | ICD-10-CM

## 2019-03-21 DIAGNOSIS — B9620 Unspecified Escherichia coli [E. coli] as the cause of diseases classified elsewhere: Secondary | ICD-10-CM | POA: Insufficient documentation

## 2019-03-21 DIAGNOSIS — I10 Essential (primary) hypertension: Secondary | ICD-10-CM

## 2019-03-21 DIAGNOSIS — I152 Hypertension secondary to endocrine disorders: Secondary | ICD-10-CM

## 2019-03-21 DIAGNOSIS — R52 Pain, unspecified: Secondary | ICD-10-CM

## 2019-03-21 DIAGNOSIS — F0390 Unspecified dementia without behavioral disturbance: Secondary | ICD-10-CM | POA: Insufficient documentation

## 2019-03-21 DIAGNOSIS — N390 Urinary tract infection, site not specified: Secondary | ICD-10-CM

## 2019-03-21 DIAGNOSIS — E119 Type 2 diabetes mellitus without complications: Secondary | ICD-10-CM | POA: Diagnosis not present

## 2019-03-21 DIAGNOSIS — G8929 Other chronic pain: Secondary | ICD-10-CM

## 2019-03-21 DIAGNOSIS — R42 Dizziness and giddiness: Secondary | ICD-10-CM

## 2019-03-21 DIAGNOSIS — F419 Anxiety disorder, unspecified: Secondary | ICD-10-CM

## 2019-03-21 LAB — URINE CULTURE: Culture: >=100000 — AB

## 2019-03-21 LAB — LEGIONELLA PNEUMOPHILA SEROGP 1 UR AG: L. pneumophila Serogp 1 Ur Ag: NEGATIVE

## 2019-03-21 NOTE — Progress Notes (Signed)
Location:    [place] Room Number: 159/P Place of Service:  SNF (31)   CODE STATUS: DNR  No Known Allergies  Chief Complaint  Patient presents with   Hospitalization Follow-up    Hospitalization Follow Up    HPI:  Amanda Hale is a 85 year old woman who has been hospitalized from 03-17-19 through 03-20-19. Amanda Hale present to the hospital due to increased weakness. Amanda Hale had recently had her fluid medication increased. Amanda Hale was found to have e-coli UTI and will need to complete her abt. Amanda Hale has pleural effusion with her cta demonstrating chronic bronchitic changes but no infection. Amanda Hale is here for short term rehab with her goal to return back home. Amanda Hale has chronic back and shoulder pain. Amanda Hale had been taking tylenol twice daily at home with an as needed hydrocodone. Amanda Hale denies any changes in her appetite; no anxiety or depressive thoughts. Amanda Hale will continue to be followed for her chronic illnesses including: back pain; dementia; afib.   Past Medical History:  Diagnosis Date   Arthritis    DM type 2 (diabetes mellitus, type 2) ([place])    Dyslipidemia    Hiatal hernia 2014   HTN (hypertension)    Osteoporosis    Reflux esophagitis 2014   erosive   Schatzki's ring 2014   Tubular adenoma     Past Surgical History:  Procedure Laterality Date   ABDOMINAL HYSTERECTOMY     APPENDECTOMY     BREAST BIOPSY     COLONOSCOPY N/A 09/08/2012   RMR: tubular adenoma, poor prep, needs surveillance April 2015    COLONOSCOPY  1990/1992/1996   [facility], multiple hyperplastic polyps   ESOPHAGOGASTRODUODENOSCOPY N/A 10/09/2015   RMR: pyloric stenosis dilated with scope.   ESOPHAGOGASTRODUODENOSCOPY (EGD) WITH ESOPHAGEAL DILATION N/A 09/08/2012   RMR: Schatzki's ring s/p 54- F dilation, erosive esophagitis, hiatal hernia, gastric/bulb erosions with mild pyloric stenosis. Negative path   EYE SURGERY     both   HIP SURGERY     right x 2   RECTAL SURGERY      prolapsed rectum/hemorrhoids    Social History   Socioeconomic History   Marital status: Divorced    Spouse name: Not on file   Number of children: Not on file   Years of education: Not on file   Highest education level: Not on file  Occupational History   Not on file  Social Needs   Financial resource strain: Not on file   Food insecurity    Worry: Not on file    Inability: Not on file   Transportation needs    Medical: Not on file    Non-medical: Not on file  Tobacco Use   Smoking status: Never Smoker   Smokeless tobacco: Never Used  Substance and Sexual Activity   Alcohol use: No   Drug use: No   Sexual activity: Never  Lifestyle   Physical activity    Days per week: Not on file    Minutes per session: Not on file   Stress: Not on file  Relationships   Social connections    Talks on phone: Not on file    Gets together: Not on file    Attends religious service: Not on file    Active member of club or organization: Not on file    Attends meetings of clubs or organizations: Not on file    Relationship status: Not on file   Intimate partner violence    Fear  of current or ex partner: Not on file    Emotionally abused: Not on file    Physically abused: Not on file    Forced sexual activity: Not on file  Other Topics Concern   Not on file  Social History Narrative   Not on file   Family History  Problem Relation Age of Onset   Hypertension Mother    Hypertension Son    Colon cancer Neg Hx       VITAL SIGNS BP (!) 141/86    Pulse 82    Temp (!) 97.1 F (36.2 C) (Oral)    Resp 20    Ht _0  (1.499 m)    Wt 136 lb 12.8 oz (62.1 kg)    BMI 27.63 kg/m   Outpatient Encounter Medications as of 03/21/2019  Medication Sig   acetaminophen (TYLENOL) 650 MG CR tablet Take 650 mg by mouth 2 (two) times daily. For chronic shoulder and back pain   ALPRAZolam (XANAX) 0.5 MG tablet Take 1 tablet (0.5 mg total) by mouth as needed for  anxiety.   Blood Glucose Monitoring Suppl KIT Use once a day   cefdinir (OMNICEF) 300 MG capsule Take 1 capsule (300 mg total) by mouth 2 (two) times daily for 5 days.   diltiazem (CARDIZEM CD) 120 MG 24 hr capsule Take 1 capsule (120 mg total) by mouth daily.   donepezil (ARICEPT) 10 MG tablet Take 10 mg by mouth at bedtime.   ELIQUIS 2.5 MG TABS tablet TAKE 1 TABLET BY MOUTH TWICE DAILY   furosemide (LASIX) 20 MG tablet TAKE 1 TABLET BY MOUTH DAILY AS NEEDED FOR swelling   HYDROcodone-acetaminophen (NORCO/VICODIN) 5-325 MG tablet Take 1 tablet by mouth every 8 (eight) hours as needed for severe pain. for pain   meclizine (ANTIVERT) 25 MG tablet Take 25 mg by mouth 4 (four) times daily as needed. Take 2 tabs po qam and 2 tabs po qpm.   memantine (NAMENDA) 5 MG tablet Take 5 mg by mouth 2 (two) times daily.   metFORMIN (GLUCOPHAGE) 500 MG tablet Take 500 mg by mouth daily with breakfast.   Metoprolol Tartrate 75 MG TABS Take 75 mg by mouth 2 (two) times daily.   Multiple Vitamins-Minerals (PRESERVISION AREDS 2 PO) Take 1 tablet by mouth 2 (two) times daily.   NON FORMULARY Diet- Liquid: Regular  Diet: Regular NAS, Consistent Carbohydrate   risperiDONE (RISPERDAL) 0.25 MG tablet Take 0.25 mg by mouth at bedtime.    [DISCONTINUED] metoprolol tartrate (LOPRESSOR) 25 MG tablet Take 25 mg by mouth 2 (two) times daily. = 17m total   [DISCONTINUED] metoprolol tartrate (LOPRESSOR) 50 MG tablet Take 50 mg by mouth 2 (two) times daily. = 793mtotal   No facility-administered encounter medications on file as of 03/21/2019.      SIGNIFICANT DIAGNOSTIC EXAMS  TODAY;   03-17-19: chest x-ray; Small left pleural effusion and associated left lung base atelectasis versus infiltrate, similar or slightly increased since the prior radiograph.  03-18-19: ct of head: No acute intracranial abnormality.   03-18-19: ct of chest:  1. Trace bilateral pleural effusions. 2. No focal  infiltrate. 3. There is cholelithiasis without secondary signs of acute cholecystitis. Aortic Atherosclerosis   LABS REVIEWED TODAY;   03-17-19; wbc 6.2; hgb 10.6; hct 35.5; mcv 98.9; plt 238; glucose 202; bun 24; creat 1.01; k+ 3.8; na++ 143; ca 9.3; BNP 240.0 03-18-19: wbc 5.9; hgb 11.2; hct 37.0; mcv 98.9 plt 225; glucose 111; bun 26; creat  0.89; k+ 3.8; na++ 144; ca 9.1; ast 100; alt 65; albumin 3.2; urine culture: e-coli 03-20-19: wbc 6.9; hgb 10.8; hct 36.0; mcv 98.1 plt 249; glucose 147; bun 24; creat 0.98; k+ 4.2; na++ 143; ca 8.8   Review of Systems  Constitutional: Negative for malaise/fatigue.  Respiratory: Negative for cough and shortness of breath.   Cardiovascular: Negative for chest pain, palpitations and leg swelling.  Gastrointestinal: Negative for abdominal pain, constipation and heartburn.  Musculoskeletal: Positive for back pain and joint pain. Negative for myalgias.       Has chronic back and shoulder pain   Skin: Negative.   Neurological: Negative for dizziness.  Psychiatric/Behavioral: The patient is not nervous/anxious.      Physical Exam Constitutional:      General: Amanda Hale is not in acute distress.    Appearance: Amanda Hale is well-developed. Amanda Hale is not diaphoretic.  Neck:     Musculoskeletal: Neck supple.     Thyroid: No thyromegaly.  Cardiovascular:     Rate and Rhythm: Normal rate and regular rhythm.     Pulses: Normal pulses.     Heart sounds: Normal heart sounds.  Pulmonary:     Effort: Pulmonary effort is normal. No respiratory distress.     Breath sounds: Normal breath sounds.  Abdominal:     General: Bowel sounds are normal. There is no distension.     Palpations: Abdomen is soft.     Tenderness: There is no abdominal tenderness.  Musculoskeletal:     Right lower leg: No edema.     Left lower leg: No edema.     Comments: Is able to move all extremities   Lymphadenopathy:     Cervical: No cervical adenopathy.  Skin:    General: Skin is warm and  dry.  Neurological:     Mental Status: Amanda Hale is alert and oriented to person, place, and time.       ASSESSMENT/ PLAN:  TODAY;   1. Chronic diastolic heart failure: is stable will continue lasix 20 mg daily as needed; lopressor 75 mg twice daily   2. Atrial fibrillation with RVR: heart rate is stable will continue cardizem cd 120 mg daily and lopressor 75 mg twice daily for rate control; will continue eliquis 2.5 mg twice daily   3. Hypertension associated with type 2 diabetes mellitus: is stable b/p 141/86 will continue lopressor 75 mg twice daily   4. E-coli UTI: is stable due to sensitivity will change to cipro 250 mg twice daily through 03-25-19.  5. Dementia without behavioral disturbance unspecified dementia type: is without change; weight is 136 pounds will continue aricept 10 mg daily and namenda 5 mg twice daily   6. Chronic vertigo: is stable will reduce her antivert 25 mg twice daily. More than likely Amanda Hale is not receiving much benefit from this medication.   7. Type 2 diabetes mellitus without complication without insulin: is stable her creatinine clearance is 36.63 will lower her metformin to 500 gm daily will monitor  8. Anxiety: is stable will continue xanax 0.5 mg daily as needed through 04-03-19  9. 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  Chronic generalized pain: has chronic back and bilateral shoulder pain:  is stable will begin tylenol cr 650 mg twice daily and will continue vicodin 5/325 mg every 8 hours as needed through 03-25-19 will monitor her status.    Check cbc; bmp 03-27-19      MD  is aware of resident's narcotic use and is in agreement with current plan of care. We will attempt to wean resident as appropriate.  Ok Edwards NP [facility]  Contact [phone] Monday through Friday 8am- 5pm  After hours call [phone]

## 2019-03-22 ENCOUNTER — Encounter: Payer: Self-pay | Admitting: Internal Medicine

## 2019-03-22 ENCOUNTER — Non-Acute Institutional Stay (SKILLED_NURSING_FACILITY): Payer: Medicare Other | Admitting: Internal Medicine

## 2019-03-22 DIAGNOSIS — I10 Essential (primary) hypertension: Secondary | ICD-10-CM

## 2019-03-22 DIAGNOSIS — B9620 Unspecified Escherichia coli [E. coli] as the cause of diseases classified elsewhere: Secondary | ICD-10-CM | POA: Diagnosis not present

## 2019-03-22 DIAGNOSIS — N390 Urinary tract infection, site not specified: Secondary | ICD-10-CM

## 2019-03-22 DIAGNOSIS — I5032 Chronic diastolic (congestive) heart failure: Secondary | ICD-10-CM | POA: Diagnosis not present

## 2019-03-22 DIAGNOSIS — I480 Paroxysmal atrial fibrillation: Secondary | ICD-10-CM | POA: Diagnosis not present

## 2019-03-22 DIAGNOSIS — F0390 Unspecified dementia without behavioral disturbance: Secondary | ICD-10-CM

## 2019-03-22 DIAGNOSIS — I152 Hypertension secondary to endocrine disorders: Secondary | ICD-10-CM

## 2019-03-22 DIAGNOSIS — E1159 Type 2 diabetes mellitus with other circulatory complications: Secondary | ICD-10-CM

## 2019-03-22 NOTE — Progress Notes (Signed)
: Provider:  Hennie Duos., MD Location:  [facility] Room Number: 159-P Place of Service:  SNF ([phone])  PCP: Celene Squibb, MD Patient Care Team: Celene Squibb, MD as PCP - General (Internal Medicine) Harl Bowie Alphonse Guild, MD as PCP - Cardiology (Cardiology) Josue Hector, MD as Attending Physician (Cardiology) Gala Romney [name], MD as Attending Physician (Gastroenterology)  Extended Emergency Contact Information Primary Emergency Contact: Astrid Divine Address: [place], [place] [address] Johnnette Litter of [place] Phone: [phone] Mobile Phone: [phone] Relation: Daughter Secondary Emergency Contact: Sherren Mocha [place] of [place] Phone: [phone] Mobile Phone: [phone] Relation: Daughter     Allergies: Patient has no known allergies.  Chief Complaint  Patient presents with   New Admit To SNF    New admission to [facility]    HPI: Patient is a 85 y.o. female hypertension, hyperlipidemia, diabetes mellitus type 2, GERD/Schatzki's ring, who presented with generalized weakness to [facility].  Daughter noted that her fluid medication was increased for lower extremity edema for a week prior.  Patient denied any headache focal weakness or numbness or tingling.  In the ED vital signs were stable, chest x-ray showed left pleural effusion and associated left lung base atelectasis versus infiltrate similar slight increase since prior radiograph, BNP 240 and urinalysis with greater than 50 WBCs.  Patient was admitted to [facility] from 10/30-11/2 for UTI, which turned out to be E. coli UTI..  All other problems being stable patient is admitted to skilled nursing facility for OT/PT.  While at skilled nursing facility patient will be followed for hypertension treated with diltiazem Lasix and metoprolol,, dementia treated with Aricept and Namenda, and atrial fibrillation treated with  metoprolol and Eliquis.  Past Medical History:  Diagnosis Date   Arthritis    DM type 2 (diabetes mellitus, type 2) ([place])    Dyslipidemia    Hiatal hernia 2014   HTN (hypertension)    Osteoporosis    Reflux esophagitis 2014   erosive   Schatzki's ring 2014   Tubular adenoma     Past Surgical History:  Procedure Laterality Date   ABDOMINAL HYSTERECTOMY     APPENDECTOMY     BREAST BIOPSY     COLONOSCOPY N/A 09/08/2012   RMR: tubular adenoma, poor prep, needs surveillance April 2015    COLONOSCOPY  1990/1992/1996   [facility], multiple hyperplastic polyps   ESOPHAGOGASTRODUODENOSCOPY N/A 10/09/2015   RMR: pyloric stenosis dilated with scope.   ESOPHAGOGASTRODUODENOSCOPY (EGD) WITH ESOPHAGEAL DILATION N/A 09/08/2012   RMR: Schatzki's ring s/p 54- F dilation, erosive esophagitis, hiatal hernia, gastric/bulb erosions with mild pyloric stenosis. Negative path   EYE SURGERY     both   HIP SURGERY     right x 2   RECTAL SURGERY     prolapsed rectum/hemorrhoids    Allergies as of 03/22/2019   No Known Allergies     Medication List    Notice   This visit is during an admission. Changes to the med list made in this visit will be reflected in the After Visit Summary of the admission.    Current Outpatient Medications on File Prior to Visit  Medication Sig Dispense Refill   acetaminophen (TYLENOL) 650 MG CR tablet Take 650 mg by mouth 2 (two) times daily. For chronic shoulder and back pain     ALPRAZolam (XANAX) 0.5 MG tablet  Take 1 tablet (0.5 mg total) by mouth as needed for anxiety. 10 tablet 0   ciprofloxacin (CIPRO) 250 MG tablet Take 250 mg by mouth 2 (two) times daily.     diltiazem (CARDIZEM CD) 120 MG 24 hr capsule Take 1 capsule (120 mg total) by mouth daily. 180 capsule 0   donepezil (ARICEPT) 10 MG tablet Take 10 mg by mouth at bedtime.     ELIQUIS 2.5 MG TABS tablet TAKE 1 TABLET BY MOUTH TWICE DAILY 180 tablet 1    furosemide (LASIX) 20 MG tablet TAKE 1 TABLET BY MOUTH DAILY AS NEEDED FOR swelling 90 tablet 1   meclizine (ANTIVERT) 25 MG tablet Take 25 mg by mouth 2 (two) times daily.      memantine (NAMENDA) 5 MG tablet Take 5 mg by mouth 2 (two) times daily.     Metoprolol Tartrate 75 MG TABS Take 75 mg by mouth 2 (two) times daily.     Multiple Vitamins-Minerals (PRESERVISION AREDS 2 PO) Take 1 tablet by mouth 2 (two) times daily.     NON FORMULARY Diet- Liquid: Regular  Diet: Regular NAS, Consistent Carbohydrate     risperiDONE (RISPERDAL) 0.25 MG tablet Take 0.25 mg by mouth at bedtime.      Blood Glucose Monitoring Suppl KIT Use once a day     No current facility-administered medications on file prior to visit.      No orders of the defined types were placed in this encounter.    There is no immunization history on file for this patient.  Social History   Tobacco Use   Smoking status: Never Smoker   Smokeless tobacco: Never Used  Substance Use Topics   Alcohol use: No    Family history is   Family History  Problem Relation Age of Onset   Hypertension Mother    Hypertension Son    Colon cancer Neg Hx       Review of Systems  DATA OBTAINED: from patient, nurse GENERAL:  no fevers, fatigue, appetite changes SKIN: No itching, or rash EYES: No eye pain, redness, discharge EARS: No earache, tinnitus, change in hearing NOSE: No congestion, drainage or bleeding  MOUTH/THROAT: No mouth or tooth pain, No sore throat RESPIRATORY: No cough, wheezing, SOB CARDIAC: No chest pain, palpitations, says her legs are full of water GI: No abdominal pain, No N/V/D or constipation, No heartburn or reflux  GU: No dysuria, frequency or urgency, or incontinence  MUSCULOSKELETAL: No unrelieved bone/joint pain NEUROLOGIC: No headache, dizziness or focal weakness PSYCHIATRIC: No c/o anxiety or sadness   Vitals:   03/22/19 0944  BP: 110/67  Pulse: 61  Resp: 20  Temp: (!) 97.5  F (36.4 C)    SpO2 Readings from Last 1 Encounters:  03/20/19 96%   Body mass index is 27.31 kg/m.     Physical Exam  GENERAL APPEARANCE: Alert, conversant,  No acute distress.  SKIN: No diaphoresis rash HEAD: Normocephalic, atraumatic  EYES: Conjunctiva/lids clear. Pupils round, reactive. EOMs intact.  EARS: External exam WNL, canals clear. Hearing grossly normal.  NOSE: No deformity or discharge.  MOUTH/THROAT: Lips w/o lesions  RESPIRATORY: Breathing is even, unlabored. Lung sounds are clear   CARDIOVASCULAR: Heart RRR no murmurs, rubs or gallops.  Trace peripheral edema.   GASTROINTESTINAL: Abdomen is soft, non-tender, not distended w/ normal bowel sounds. GENITOURINARY: Bladder non tender, not distended  MUSCULOSKELETAL: No abnormal joints or musculature NEUROLOGIC:  Cranial nerves 2-12 grossly intact. Moves all extremities  PSYCHIATRIC:  Mood and affect appropriate to situation, no behavioral issues  Patient Active Problem List   Diagnosis Date Noted   Hypertension associated with type 2 diabetes mellitus ([place]) 03/21/2019   E. 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 ([place]) 03/21/2019   Chronic generalized pain 03/21/2019   Peripheral edema    Weakness    Acute lower UTI 03/18/2019   Dysphagia 02/23/2018   Diarrhea 02/23/2018   Pressure injury of skin 12/27/2017   Atrial fibrillation with RVR ([place]) 12/24/2017   Chronic diastolic heart failure (HCC) 09/62/8366   Periumbilical discomfort 29/47/6546   Nausea with vomiting 05/05/2017   Pyloric stenosis, acquired 11/06/2015   Congenital hypertrophic pyloric stenosis    Upper abdominal pain 09/13/2015   Loss of weight 09/13/2015   GERD (gastroesophageal reflux disease) 09/13/2015   Esophageal motility disorder 02/21/2013   Cough 02/21/2013    Esophageal dysphagia 08/11/2012   Chronic constipation 08/11/2012   Personal history of colonic polyps 08/11/2012   Chest pain 07/27/2012   HTN (hypertension) 07/26/2012   Diabetes mellitus, type II ([place]) 07/26/2012   Dyslipidemia 07/26/2012      Labs reviewed: Basic Metabolic Panel:    Component Value Date/Time   NA 143 03/20/2019 0450   NA 139 07/07/2012   K 4.2 03/20/2019 0450   K 4.1 07/07/2012   CL 105 03/20/2019 0450   CO2 26 03/20/2019 0450   GLUCOSE 147 (H) 03/20/2019 0450   BUN 24 (H) 03/20/2019 0450   BUN 14 07/07/2012   CREATININE 0.98 03/20/2019 0450   CREATININE 0.8 07/07/2012   CALCIUM 8.8 (L) 03/20/2019 0450   CALCIUM 9.3 07/07/2012   PROT 6.1 (L) 03/18/2019 0215   ALBUMIN 3.2 (L) 03/18/2019 0215   ALBUMIN 3.9 07/07/2012   AST 100 (H) 03/18/2019 0215   AST 18 07/07/2012   ALT 65 (H) 03/18/2019 0215   ALKPHOS 102 03/18/2019 0215   ALKPHOS 75 07/07/2012   BILITOT 0.4 03/18/2019 0215   BILITOT 0.6 07/07/2012   GFRNONAA 50 (L) 03/20/2019 0450   GFRAA 58 (L) 03/20/2019 0450    Recent Labs    03/17/19 2101 03/18/19 0215 03/20/19 0450  NA 143 144 143  K 3.8 3.8 4.2  CL 104 107 105  CO2 '30 30 26  ' GLUCOSE 202* 111* 147*  BUN 24* 26* 24*  CREATININE 1.01* 0.89 0.98  CALCIUM 9.3 9.1 8.8*   Liver Function Tests: Recent Labs    03/18/19 0215  AST 100*  ALT 65*  ALKPHOS 102  BILITOT 0.4  PROT 6.1*  ALBUMIN 3.2*   No results for input(s): LIPASE, AMYLASE in the last 8760 hours. No results for input(s): AMMONIA in the last 8760 hours. CBC: Recent Labs    03/17/19 2101 03/18/19 0215 03/20/19 0450  WBC 6.2 5.9 6.9  HGB 10.6* 11.2* 10.8*  HCT 35.5* 37.0 36.0  MCV 98.9 98.9 98.1  PLT 238 225 249   Lipid No results for input(s): CHOL, HDL, LDLCALC, TRIG in the last 8760 hours.  Cardiac Enzymes: No results for input(s): CKTOTAL, CKMB, CKMBINDEX, TROPONINI in the last 8760 hours. BNP: Recent Labs    03/17/19 2101  BNP 240.0*    No results found for: MICROALBUR No results found for: HGBA1C Lab Results  Component Value Date   TSH 1.685 12/25/2017   No results found for: VITAMINB12 No results found for: FOLATE No results  found for: IRON, TIBC, FERRITIN  Imaging and Procedures obtained prior to SNF admission: No results found.   Not all labs, radiology exams or other studies done during hospitalization come through on my EPIC note; however they are reviewed by me.    Assessment and Plan  E. coli UTI-responded well to IV Rocephin and discharged on p.o. antibiotics SNF-admitted for OT/PT; continue Omnicef 300 mg twice daily for 5 more days  Hypertension SNF-controlled; continue diltiazem 120 mg daily, Lasix 20 mg daily scheduled not as needed, and metoprolol 75 mg twice daily  Dementia SNF-continue Aricept 10 mg daily and Namenda 5 mg twice daily  Atrial fibrillation SNF-controlled continue; continue metoprolol 75 mg twice daily with prophylaxis with Eliquis 2.5 mg twice daily  Chronic diastolic congestive heart failure SNF-compensated continue metoprolol 75 mg twice daily and Lasix 20 mg daily   Time spent greater than 45 minutes;> 50% of time with patient was spent reviewing records, labs, tests and studies, counseling and developing plan of care  Hennie Duos, MD

## 2019-03-23 ENCOUNTER — Non-Acute Institutional Stay (SKILLED_NURSING_FACILITY): Payer: Medicare Other | Admitting: Adult Health

## 2019-03-23 ENCOUNTER — Encounter: Payer: Self-pay | Admitting: Adult Health

## 2019-03-23 DIAGNOSIS — I4891 Unspecified atrial fibrillation: Secondary | ICD-10-CM | POA: Diagnosis not present

## 2019-03-23 DIAGNOSIS — I5032 Chronic diastolic (congestive) heart failure: Secondary | ICD-10-CM

## 2019-03-23 DIAGNOSIS — F0390 Unspecified dementia without behavioral disturbance: Secondary | ICD-10-CM

## 2019-03-23 DIAGNOSIS — F411 Generalized anxiety disorder: Secondary | ICD-10-CM | POA: Diagnosis not present

## 2019-03-23 DIAGNOSIS — R600 Localized edema: Secondary | ICD-10-CM | POA: Diagnosis not present

## 2019-03-23 LAB — CULTURE, BLOOD (ROUTINE X 2)
Culture: NO GROWTH
Culture: NO GROWTH
Special Requests: ADEQUATE

## 2019-03-23 NOTE — Progress Notes (Signed)
Location:   [facility] Room Number: 159/P Place of Service:  SNF (31)   CODE STATUS: dnr  No Known Allergies  Chief Complaint  Patient presents with  . Acute Visit    72-Hour Care Plan Meeting    HPI:  We have come together for her 71 hour care plan meeting. Her goal is to return back home. She lives with her daughter. She odes have a wheelchair at home. There are no steps to get into the home. She denies any uncontrolled pain; no insomnia; no anxiety. She continues to participate in therapy. She continues to be followed for her chronic illnesses including: chf; dementia; psychosis   Past Medical History:  Diagnosis Date  . Arthritis   . DM type 2 (diabetes mellitus, type 2) ([place])   . Dyslipidemia   . Hiatal hernia 2014  . HTN (hypertension)   . Osteoporosis   . Reflux esophagitis 2014   erosive  . Schatzki's ring 2014  . Tubular adenoma     Past Surgical History:  Procedure Laterality Date  . ABDOMINAL HYSTERECTOMY    . APPENDECTOMY    . BREAST BIOPSY    . COLONOSCOPY N/A 09/08/2012   RMR: tubular adenoma, poor prep, needs surveillance April 2015   . COLONOSCOPY  1990/1992/1996   [facility], multiple hyperplastic polyps  . ESOPHAGOGASTRODUODENOSCOPY N/A 10/09/2015   RMR: pyloric stenosis dilated with scope.  . ESOPHAGOGASTRODUODENOSCOPY (EGD) WITH ESOPHAGEAL DILATION N/A 09/08/2012   RMR: Schatzki's ring s/p 56- F dilation, erosive esophagitis, hiatal hernia, gastric/bulb erosions with mild pyloric stenosis. Negative path  . EYE SURGERY     both  . HIP SURGERY     right x 2  . RECTAL SURGERY     prolapsed rectum/hemorrhoids    Social History   Socioeconomic History  . Marital status: Divorced    Spouse name: Not on file  . Number of children: Not on file  . Years of education: Not on file  . Highest education level: Not on file  Occupational History  . Not on file  Social Needs  . Financial resource strain: Not on file  . Food  insecurity    Worry: Not on file    Inability: Not on file  . Transportation needs    Medical: Not on file    Non-medical: Not on file  Tobacco Use  . Smoking status: Never Smoker  . Smokeless tobacco: Never Used  Substance and Sexual Activity  . Alcohol use: No  . Drug use: No  . Sexual activity: Never  Lifestyle  . Physical activity    Days per week: Not on file    Minutes per session: Not on file  . Stress: Not on file  Relationships  . Social Herbalist on phone: Not on file    Gets together: Not on file    Attends religious service: Not on file    Active member of club or organization: Not on file    Attends meetings of clubs or organizations: Not on file    Relationship status: Not on file  . Intimate partner violence    Fear of current or ex partner: Not on file    Emotionally abused: Not on file    Physically abused: Not on file    Forced sexual activity: Not on file  Other Topics Concern  . Not on file  Social History Narrative  . Not on file   Family History  Problem Relation Age of Onset  . Hypertension Mother   . Hypertension Son   . Colon cancer Neg Hx       VITAL SIGNS BP (!) 108/58   Pulse 73   Temp 98.2 F (36.8 C) (Oral)   Resp 18   Ht '4\' 11"'  (1.499 m)   Wt 134 lb 6.4 oz (61 kg)   BMI 27.15 kg/m   Outpatient Encounter Medications as of 03/23/2019  Medication Sig  . acetaminophen (TYLENOL) 650 MG CR tablet Take 650 mg by mouth 2 (two) times daily. For chronic shoulder and back pain  . ALPRAZolam (XANAX) 0.5 MG tablet Take 1 tablet (0.5 mg total) by mouth as needed for anxiety.  . Blood Glucose Monitoring Suppl KIT Use once a day  . ciprofloxacin (CIPRO) 250 MG tablet Take 250 mg by mouth 2 (two) times daily.  Marland Kitchen diltiazem (CARDIZEM CD) 120 MG 24 hr capsule Take 1 capsule (120 mg total) by mouth daily.  Marland Kitchen donepezil (ARICEPT) 10 MG tablet Take 10 mg by mouth at bedtime.  Marland Kitchen ELIQUIS 2.5 MG TABS tablet TAKE 1 TABLET BY MOUTH TWICE  DAILY  . furosemide (LASIX) 20 MG tablet TAKE 1 TABLET BY MOUTH DAILY AS NEEDED FOR swelling  . HYDROcodone-acetaminophen (NORCO/VICODIN) 5-325 MG tablet Take 1 tablet by mouth every 8 (eight) hours as needed for severe pain. for pain  . meclizine (ANTIVERT) 25 MG tablet Take 25 mg by mouth 2 (two) times daily.   . memantine (NAMENDA) 5 MG tablet Take 5 mg by mouth 2 (two) times daily.  . Metoprolol Tartrate 75 MG TABS Take 75 mg by mouth 2 (two) times daily.  . Multiple Vitamins-Minerals (PRESERVISION AREDS 2 PO) Take 1 tablet by mouth 2 (two) times daily.  . NON FORMULARY Diet- Liquid: Regular  Diet: Regular NAS, Consistent Carbohydrate  . risperiDONE (RISPERDAL) 0.25 MG tablet Take 0.25 mg by mouth at bedtime.   . [DISCONTINUED] metFORMIN (GLUCOPHAGE) 500 MG tablet Take 500 mg by mouth daily with breakfast.   No facility-administered encounter medications on file as of 03/23/2019.      SIGNIFICANT DIAGNOSTIC EXAMS  PREVIOUS;   03-17-19: chest x-ray; Small left pleural effusion and associated left lung base atelectasis versus infiltrate, similar or slightly increased since the prior radiograph.  03-18-19: ct of head: No acute intracranial abnormality.   03-18-19: ct of chest:  1. Trace bilateral pleural effusions. 2. No focal infiltrate. 3. There is cholelithiasis without secondary signs of acute cholecystitis. Aortic Atherosclerosis   NO NEW EXAMS.   LABS REVIEWED PREVIOUS ;   03-17-19; wbc 6.2; hgb 10.6; hct 35.5; mcv 98.9; plt 238; glucose 202; bun 24; creat 1.01; k+ 3.8; na++ 143; ca 9.3; BNP 240.0 03-18-19: wbc 5.9; hgb 11.2; hct 37.0; mcv 98.9 plt 225; glucose 111; bun 26; creat 0.89; k+ 3.8; na++ 144; ca 9.1; ast 100; alt 65; albumin 3.2; urine culture: e-coli 03-20-19: wbc 6.9; hgb 10.8; hct 36.0; mcv 98.1 plt 249; glucose 147; bun 24; creat 0.98; k+ 4.2; na++ 143; ca 8.8   NO NEW LABS.   Review of Systems  Constitutional: Negative for malaise/fatigue.  Respiratory:  Negative for cough and shortness of breath.   Cardiovascular: Negative for chest pain, palpitations and leg swelling.  Gastrointestinal: Negative for abdominal pain, constipation and heartburn.  Musculoskeletal: Negative for back pain, joint pain and myalgias.  Skin: Negative.   Neurological: Negative for dizziness.  Psychiatric/Behavioral: The patient is not nervous/anxious.  Physical Exam Constitutional:      General: She is not in acute distress.    Appearance: She is well-developed. She is not diaphoretic.  Neck:     Musculoskeletal: Neck supple.     Thyroid: No thyromegaly.  Cardiovascular:     Rate and Rhythm: Normal rate and regular rhythm.     Pulses: Normal pulses.     Heart sounds: Normal heart sounds.  Pulmonary:     Effort: Pulmonary effort is normal. No respiratory distress.     Breath sounds: Normal breath sounds.  Abdominal:     General: Bowel sounds are normal. There is no distension.     Palpations: Abdomen is soft.     Tenderness: There is no abdominal tenderness.  Musculoskeletal: Normal range of motion.     Right lower leg: No edema.     Left lower leg: No edema.  Lymphadenopathy:     Cervical: No cervical adenopathy.  Skin:    General: Skin is warm and dry.  Neurological:     Mental Status: She is alert. Mental status is at baseline.  Psychiatric:        Mood and Affect: Mood normal.      ASSESSMENT/ PLAN:  TODAY;   1. Chronic diastolic heart failure 2. Dementia without behavioral disturbance unspecified dementia type 2. 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   Will continue therapy as directed Will continue current medications Will continue current plan of care Will continue to monitor her status Will most likely need a walker       MD is aware of resident's narcotic use and is in agreement with current plan of care. We will attempt to wean resident as appropriate.  Ok Edwards NP [facility]   Contact [phone] Monday through Friday 8am- 5pm  After hours call [phone]

## 2019-03-24 ENCOUNTER — Encounter: Payer: Self-pay | Admitting: Adult Health

## 2019-03-24 ENCOUNTER — Non-Acute Institutional Stay (SKILLED_NURSING_FACILITY): Payer: Medicare Other | Admitting: Adult Health

## 2019-03-24 DIAGNOSIS — G8929 Other chronic pain: Secondary | ICD-10-CM | POA: Diagnosis not present

## 2019-03-24 DIAGNOSIS — R52 Pain, unspecified: Secondary | ICD-10-CM | POA: Diagnosis not present

## 2019-03-24 NOTE — Progress Notes (Signed)
Location:  [place] Room Number: 159-P Place of Service:  SNF (31)   CODE STATUS: DNR  No Known Allergies  Chief Complaint  Patient presents with  . Acute Visit    Patient is seen for pain management    HPI:  She is being treated with vicodin 5/325 mg every 8 hours as needed for pain management for her chronic back and shoulder pain. She has not required the use of this pain medication in 48 hours. She denies any uncontrolled pain. She does participate in therapy. There are no reports of changes in appetite; no reports of agitation or anxiety.    Past Medical History:  Diagnosis Date  . Arthritis   . DM type 2 (diabetes mellitus, type 2) ([place])   . Dyslipidemia   . Hiatal hernia 2014  . HTN (hypertension)   . Osteoporosis   . Reflux esophagitis 2014   erosive  . Schatzki's ring 2014  . Tubular adenoma     Past Surgical History:  Procedure Laterality Date  . ABDOMINAL HYSTERECTOMY    . APPENDECTOMY    . BREAST BIOPSY    . COLONOSCOPY N/A 09/08/2012   RMR: tubular adenoma, poor prep, needs surveillance April 2015   . COLONOSCOPY  1990/1992/1996   [facility] multiple hyperplastic polyps  . ESOPHAGOGASTRODUODENOSCOPY N/A 10/09/2015   RMR: pyloric stenosis dilated with scope.  . ESOPHAGOGASTRODUODENOSCOPY (EGD) WITH ESOPHAGEAL DILATION N/A 09/08/2012   RMR: Schatzki's ring s/p 54- F dilation, erosive esophagitis, hiatal hernia, gastric/bulb erosions with mild pyloric stenosis. Negative path  . EYE SURGERY     both  . HIP SURGERY     right x 2  . RECTAL SURGERY     prolapsed rectum/hemorrhoids    Social History   Socioeconomic History  . Marital status: Divorced    Spouse name: Not on file  . Number of children: Not on file  . Years of education: Not on file  . Highest education level: Not on file  Occupational History  . Not on file  Social Needs  . Financial resource strain: Not on file  . Food insecurity   Worry: Not on file    Inability: Not on file  . Transportation needs    Medical: Not on file    Non-medical: Not on file  Tobacco Use  . Smoking status: Never Smoker  . Smokeless tobacco: Never Used  Substance and Sexual Activity  . Alcohol use: No  . Drug use: No  . Sexual activity: Never  Lifestyle  . Physical activity    Days per week: Not on file    Minutes per session: Not on file  . Stress: Not on file  Relationships  . Social Herbalist on phone: Not on file    Gets together: Not on file    Attends religious service: Not on file    Active member of club or organization: Not on file    Attends meetings of clubs or organizations: Not on file    Relationship status: Not on file  . Intimate partner violence    Fear of current or ex partner: Not on file    Emotionally abused: Not on file    Physically abused: Not on file    Forced sexual activity: Not on file  Other Topics Concern  . Not on file  Social History Narrative  . Not on file   Family History  Problem Relation Age of Onset  .  Hypertension Mother   . Hypertension Son   . Colon cancer Neg Hx       VITAL SIGNS BP 121/76   Pulse 80   Temp 97.6 F (36.4 C) (Oral)   Resp 20   Ht '4\' 11"'  (1.499 m)   Wt 135 lb 3.2 oz (61.3 kg)   BMI 27.31 kg/m   Outpatient Encounter Medications as of 03/24/2019  Medication Sig  . acetaminophen (TYLENOL) 650 MG CR tablet Take 650 mg by mouth 2 (two) times daily. For chronic shoulder and back pain  . ALPRAZolam (XANAX) 0.5 MG tablet Take 1 tablet (0.5 mg total) by mouth as needed for anxiety.  . Blood Glucose Monitoring Suppl KIT Use once a day  . ciprofloxacin (CIPRO) 250 MG tablet Take 250 mg by mouth 2 (two) times daily.  Marland Kitchen diltiazem (CARDIZEM CD) 120 MG 24 hr capsule Take 1 capsule (120 mg total) by mouth daily.  Marland Kitchen donepezil (ARICEPT) 10 MG tablet Take 10 mg by mouth at bedtime.  Marland Kitchen ELIQUIS 2.5 MG TABS tablet TAKE 1 TABLET BY MOUTH TWICE DAILY  . furosemide  (LASIX) 20 MG tablet TAKE 1 TABLET BY MOUTH DAILY AS NEEDED FOR swelling  . meclizine (ANTIVERT) 25 MG tablet Take 25 mg by mouth 2 (two) times daily.   . memantine (NAMENDA) 5 MG tablet Take 5 mg by mouth 2 (two) times daily.  . Metoprolol Tartrate 75 MG TABS Take 75 mg by mouth 2 (two) times daily.  . Multiple Vitamins-Minerals (PRESERVISION AREDS 2 PO) Take 1 tablet by mouth 2 (two) times daily.  . NON FORMULARY Diet- Liquid: Regular  Diet: Regular NAS, Consistent Carbohydrate  . risperiDONE (RISPERDAL) 0.25 MG tablet Take 0.25 mg by mouth at bedtime.   . [DISCONTINUED] HYDROcodone-acetaminophen (NORCO/VICODIN) 5-325 MG tablet Take 1 tablet by mouth every 8 (eight) hours as needed for severe pain. for pain   No facility-administered encounter medications on file as of 03/24/2019.      SIGNIFICANT DIAGNOSTIC EXAMS   PREVIOUS;   03-17-19: chest x-ray; Small left pleural effusion and associated left lung base atelectasis versus infiltrate, similar or slightly increased since the prior radiograph.  03-18-19: ct of head: No acute intracranial abnormality.   03-18-19: ct of chest:  1. Trace bilateral pleural effusions. 2. No focal infiltrate. 3. There is cholelithiasis without secondary signs of acute cholecystitis. Aortic Atherosclerosis   NO NEW EXAMS.   LABS REVIEWED PREVIOUS ;   03-17-19; wbc 6.2; hgb 10.6; hct 35.5; mcv 98.9; plt 238; glucose 202; bun 24; creat 1.01; k+ 3.8; na++ 143; ca 9.3; BNP 240.0 03-18-19: wbc 5.9; hgb 11.2; hct 37.0; mcv 98.9 plt 225; glucose 111; bun 26; creat 0.89; k+ 3.8; na++ 144; ca 9.1; ast 100; alt 65; albumin 3.2; urine culture: e-coli 03-20-19: wbc 6.9; hgb 10.8; hct 36.0; mcv 98.1 plt 249; glucose 147; bun 24; creat 0.98; k+ 4.2; na++ 143; ca 8.8   NO NEW LABS.   Review of Systems  Constitutional: Negative for malaise/fatigue.  Respiratory: Negative for cough and shortness of breath.   Cardiovascular: Negative for chest pain, palpitations  and leg swelling.  Gastrointestinal: Negative for abdominal pain, constipation and heartburn.  Musculoskeletal: Negative for back pain, joint pain and myalgias.  Skin: Negative.   Neurological: Negative for dizziness.  Psychiatric/Behavioral: The patient is not nervous/anxious.       Physical Exam Constitutional:      General: She is not in acute distress.    Appearance: She is well-developed.  She is not diaphoretic.  Neck:     Musculoskeletal: Neck supple.     Thyroid: No thyromegaly.  Cardiovascular:     Rate and Rhythm: Normal rate and regular rhythm.     Pulses: Normal pulses.     Heart sounds: Normal heart sounds.  Pulmonary:     Effort: Pulmonary effort is normal. No respiratory distress.     Breath sounds: Normal breath sounds.  Abdominal:     General: Bowel sounds are normal. There is no distension.     Palpations: Abdomen is soft.     Tenderness: There is no abdominal tenderness.  Musculoskeletal: Normal range of motion.     Right lower leg: No edema.     Left lower leg: No edema.  Lymphadenopathy:     Cervical: No cervical adenopathy.  Skin:    General: Skin is warm and dry.  Neurological:     Mental Status: She is alert. Mental status is at baseline.  Psychiatric:        Mood and Affect: Mood normal.      ASSESSMENT/ PLAN:  TODAY;   1. Chronic generalized pain: her pain is presently being managed; will stop vicodin at this time will monitor her status.     MD is aware of resident's narcotic use and is in agreement with current plan of care. We will attempt to wean resident as appropriate.  Ok Edwards NP [facility] Adult Medicine  Contact [phone] Monday through Friday 8am- 5pm  After hours call [phone]

## 2019-03-25 ENCOUNTER — Encounter: Payer: Self-pay | Admitting: Internal Medicine

## 2019-03-25 DIAGNOSIS — I4891 Unspecified atrial fibrillation: Secondary | ICD-10-CM | POA: Insufficient documentation

## 2019-03-27 ENCOUNTER — Encounter (HOSPITAL_COMMUNITY)
Admission: RE | Admit: 2019-03-27 | Discharge: 2019-03-27 | Disposition: A | Discharge status: Home / Self Care | Payer: Medicare Other | Source: Skilled Nursing Facility | Attending: Internal Medicine | Admitting: Internal Medicine

## 2019-03-27 DIAGNOSIS — I10 Essential (primary) hypertension: Secondary | ICD-10-CM | POA: Insufficient documentation

## 2019-03-27 LAB — CBC
HCT: 36.4 % (ref 36.0–46.0)
Hemoglobin: 11.1 g/dL — ABNORMAL LOW (ref 12.0–15.0)
MCH: 29.3 pg (ref 26.0–34.0)
MCHC: 30.5 g/dL (ref 30.0–36.0)
MCV: 96 fL (ref 80.0–100.0)
Platelets: 280 10*3/uL (ref 150–400)
RBC: 3.79 MIL/uL — ABNORMAL LOW (ref 3.87–5.11)
RDW: 14.3 % (ref 11.5–15.5)
WBC: 7.1 10*3/uL (ref 4.0–10.5)
nRBC: 0 % (ref 0.0–0.2)

## 2019-03-27 LAB — BASIC METABOLIC PANEL
Anion gap: 2 — ABNORMAL LOW (ref 5–15)
BUN: 32 mg/dL — ABNORMAL HIGH (ref 8–23)
CO2: 25 mmol/L (ref 22–32)
Calcium: 8.2 mg/dL — ABNORMAL LOW (ref 8.9–10.3)
Chloride: 112 mmol/L — ABNORMAL HIGH (ref 98–111)
Creatinine, Ser: 0.91 mg/dL (ref 0.44–1.00)
GFR calc Af Amer: >60 mL/min (ref >60)
GFR calc non Af Amer: 55 mL/min — ABNORMAL LOW (ref >60)
Glucose, Bld: 140 mg/dL — ABNORMAL HIGH (ref 70–99)
Potassium: 4 mmol/L (ref 3.5–5.1)
Sodium: 139 mmol/L (ref 135–145)

## 2019-03-27 LAB — HEMOGLOBIN A1C
Hgb A1c MFr Bld: 6.3 % — ABNORMAL HIGH (ref 4.8–5.6)
Mean Plasma Glucose: 134.11 mg/dL

## 2019-03-28 ENCOUNTER — Other Ambulatory Visit: Payer: Self-pay | Admitting: *Deleted

## 2019-03-28 ENCOUNTER — Encounter: Payer: Self-pay | Admitting: Adult Health

## 2019-03-28 ENCOUNTER — Non-Acute Institutional Stay (SKILLED_NURSING_FACILITY): Payer: Medicare Other | Admitting: Adult Health

## 2019-03-28 DIAGNOSIS — I10 Essential (primary) hypertension: Secondary | ICD-10-CM | POA: Diagnosis not present

## 2019-03-28 DIAGNOSIS — E1159 Type 2 diabetes mellitus with other circulatory complications: Secondary | ICD-10-CM

## 2019-03-28 DIAGNOSIS — I4891 Unspecified atrial fibrillation: Secondary | ICD-10-CM

## 2019-03-28 DIAGNOSIS — I5032 Chronic diastolic (congestive) heart failure: Secondary | ICD-10-CM | POA: Diagnosis not present

## 2019-03-28 NOTE — Progress Notes (Signed)
Location:    [place] Room Number: 159/P Place of Service:  SNF (31)   CODE STATUS: DNR  No Known Allergies  Chief Complaint  Patient presents with  . Medical Management of Chronic Issues       Chronic diastolic heart failure: Atrial fibrillation with RVR Hypertension associated with type 2 diabetes mellitus:  Weekly follow up for the first 30 days post hospitalization.      HPI:  She is a 85 year old short term rehab patient being seen for the management of her chronic illnesses: heart failure; afib; hypertension; she denies any uncontrolled pain is off vicodin. She denies any changes in her appetite; denies any anxiety. No reports of insomnia.   Past Medical History:  Diagnosis Date  . Arthritis   . DM type 2 (diabetes mellitus, type 2) ([place])   . Dyslipidemia   . Hiatal hernia 2014  . HTN (hypertension)   . Osteoporosis   . Reflux esophagitis 2014   erosive  . Schatzki's ring 2014  . Tubular adenoma     Past Surgical History:  Procedure Laterality Date  . ABDOMINAL HYSTERECTOMY    . APPENDECTOMY    . BREAST BIOPSY    . COLONOSCOPY N/A 09/08/2012   RMR: tubular adenoma, poor prep, needs surveillance April 2015   . COLONOSCOPY  1990/1992/1996   [facility], multiple hyperplastic polyps  . ESOPHAGOGASTRODUODENOSCOPY N/A 10/09/2015   RMR: pyloric stenosis dilated with scope.  . ESOPHAGOGASTRODUODENOSCOPY (EGD) WITH ESOPHAGEAL DILATION N/A 09/08/2012   RMR: Schatzki's ring s/p 54- F dilation, erosive esophagitis, hiatal hernia, gastric/bulb erosions with mild pyloric stenosis. Negative path  . EYE SURGERY     both  . HIP SURGERY     right x 2  . RECTAL SURGERY     prolapsed rectum/hemorrhoids    Social History   Socioeconomic History  . Marital status: Divorced    Spouse name: Not on file  . Number of children: Not on file  . Years of education: Not on file  . Highest education level: Not on file  Occupational History   . Not on file  Social Needs  . Financial resource strain: Not on file  . Food insecurity    Worry: Not on file    Inability: Not on file  . Transportation needs    Medical: Not on file    Non-medical: Not on file  Tobacco Use  . Smoking status: Never Smoker  . Smokeless tobacco: Never Used  Substance and Sexual Activity  . Alcohol use: No  . Drug use: No  . Sexual activity: Never  Lifestyle  . Physical activity    Days per week: Not on file    Minutes per session: Not on file  . Stress: Not on file  Relationships  . Social Herbalist on phone: Not on file    Gets together: Not on file    Attends religious service: Not on file    Active member of club or organization: Not on file    Attends meetings of clubs or organizations: Not on file    Relationship status: Not on file  . Intimate partner violence    Fear of current or ex partner: Not on file    Emotionally abused: Not on file    Physically abused: Not on file    Forced sexual activity: Not on file  Other Topics Concern  . Not on file  Social History Narrative  .  Not on file   Family History  Problem Relation Age of Onset  . Hypertension Mother   . Hypertension Son   . Colon cancer Neg Hx       VITAL SIGNS BP (!) 108/59   Pulse 67   Temp (!) 97.3 F (36.3 C) (Oral)   Resp 20   Ht '4\' 11"'  (1.499 m)   Wt 138 lb 3.2 oz (62.7 kg)   BMI 27.91 kg/m   Outpatient Encounter Medications as of 03/28/2019  Medication Sig  . acetaminophen (TYLENOL) 650 MG CR tablet Take 650 mg by mouth 2 (two) times daily. For chronic shoulder and back pain  . ALPRAZolam (XANAX) 0.5 MG tablet Take 1 tablet (0.5 mg total) by mouth as needed for anxiety.  . Amino Acids-Protein Hydrolys (FEEDING SUPPLEMENT, PRO-STAT SUGAR FREE 64,) LIQD Take 30 mLs by mouth daily.  . Blood Glucose Monitoring Suppl KIT Use once a day  . diltiazem (CARDIZEM CD) 120 MG 24 hr capsule Take 1 capsule (120 mg total) by mouth daily.  Marland Kitchen donepezil  (ARICEPT) 10 MG tablet Take 10 mg by mouth at bedtime.  Marland Kitchen ELIQUIS 2.5 MG TABS tablet TAKE 1 TABLET BY MOUTH TWICE DAILY  . furosemide (LASIX) 20 MG tablet TAKE 1 TABLET BY MOUTH DAILY AS NEEDED FOR swelling  . meclizine (ANTIVERT) 25 MG tablet Take 25 mg by mouth 2 (two) times daily.   . memantine (NAMENDA) 5 MG tablet Take 5 mg by mouth 2 (two) times daily.  . Metoprolol Tartrate 75 MG TABS Take 75 mg by mouth 2 (two) times daily.  . Multiple Vitamin (MULTIVITAMIN WITH MINERALS) TABS tablet Take 1 tablet by mouth daily.  . Multiple Vitamins-Minerals (PRESERVISION AREDS 2 PO) Take 1 tablet by mouth 2 (two) times daily.  . NON FORMULARY Diet- Liquid: Regular  Diet: Regular NAS, Consistent Carbohydrate  . risperiDONE (RISPERDAL) 0.25 MG tablet Take 0.25 mg by mouth at bedtime.    No facility-administered encounter medications on file as of 03/28/2019.      SIGNIFICANT DIAGNOSTIC EXAMS   PREVIOUS;   03-17-19: chest x-ray; Small left pleural effusion and associated left lung base atelectasis versus infiltrate, similar or slightly increased since the prior radiograph.  03-18-19: ct of head: No acute intracranial abnormality.   03-18-19: ct of chest:  1. Trace bilateral pleural effusions. 2. No focal infiltrate. 3. There is cholelithiasis without secondary signs of acute cholecystitis. Aortic Atherosclerosis   NO NEW EXAMS.   LABS REVIEWED PREVIOUS ;   03-17-19; wbc 6.2; hgb 10.6; hct 35.5; mcv 98.9; plt 238; glucose 202; bun 24; creat 1.01; k+ 3.8; na++ 143; ca 9.3; BNP 240.0 03-18-19: wbc 5.9; hgb 11.2; hct 37.0; mcv 98.9 plt 225; glucose 111; bun 26; creat 0.89; k+ 3.8; na++ 144; ca 9.1; ast 100; alt 65; albumin 3.2; urine culture: e-coli 03-20-19: wbc 6.9; hgb 10.8; hct 36.0; mcv 98.1 plt 249; glucose 147; bun 24; creat 0.98; k+ 4.2; na++ 143; ca 8.8   TODAY;   03-27-19: wbc 7.1; hgb 11.1; hct 36.4; mcv 96.0 plt 280; glucose 140; bun 32; creat 0.91; k+ 4.0; na++ 139; ca 8.2; hgb  a1c 6.3   Review of Systems  Constitutional: Negative for malaise/fatigue.  Respiratory: Negative for cough and shortness of breath.   Cardiovascular: Negative for chest pain, palpitations and leg swelling.  Gastrointestinal: Negative for abdominal pain, constipation and heartburn.  Musculoskeletal: Negative for back pain, joint pain and myalgias.  Skin: Negative.   Neurological: Negative for  dizziness.  Psychiatric/Behavioral: The patient is not nervous/anxious.    Physical Exam Constitutional:      General: She is not in acute distress.    Appearance: She is well-developed. She is not diaphoretic.  Eyes:     Comments: History of bilateral eye surgery  Neck:     Musculoskeletal: Neck supple.     Thyroid: No thyromegaly.  Cardiovascular:     Rate and Rhythm: Normal rate and regular rhythm.     Pulses: Normal pulses.     Heart sounds: Normal heart sounds.  Pulmonary:     Effort: Pulmonary effort is normal. No respiratory distress.     Breath sounds: Normal breath sounds.  Abdominal:     General: Bowel sounds are normal. There is no distension.     Palpations: Abdomen is soft.     Tenderness: There is no abdominal tenderness.     Comments: History of prolapse rectum and hemorrhoids   Musculoskeletal: Normal range of motion.     Right lower leg: No edema.     Left lower leg: No edema.     Comments: History of right hip surgery X 2   Lymphadenopathy:     Cervical: No cervical adenopathy.  Skin:    General: Skin is warm and dry.  Neurological:     Mental Status: She is alert. Mental status is at baseline.  Psychiatric:        Mood and Affect: Mood normal.    .    ASSESSMENT/ PLAN:  TODAY;   1. Chronic diastolic heart failure: is stable will continue lopressor 75 mg twice daily and lasix 20 mg daily as needed  2. Atrial fibrillation with RVR heart rate is stable will continue lopressor 75 mg twice daily; cardiizem cd 120 mg daily for rate control and eliquis 2.5 mg  twice daily   3. Hypertension associated with type 2 diabetes mellitus: is stable b/p 108/59 will continue lopressor 75 mg twice daily   PREVIOUS  4. Dementia without behavioral disturbance unspecified dementia type: is without change; weight is 138 pounds will continue aricept 10 mg daily and namenda 5 mg twice daily   5. Chronic vertigo: is stable will reduce her antivert 25 mg twice daily. More than likely she is not receiving much benefit from this medication.   6. Type 2 diabetes mellitus without complication without insulin: is stable hgb a1c hgb a1c 6.3 is off medications will monitor  7. Anxiety: is stable will continue xanax 0.5 mg daily as needed through 04-03-19  8. 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  Chronic generalized pain: has chronic back and bilateral shoulder pain:  is stable will begin tylenol cr 650 mg twice daily  will monitor her status.       MD is aware of resident's narcotic use and is in agreement with current plan of care. We will attempt to wean resident as appropriate.  Ok Edwards NP [facility] Adult Medicine  Contact [phone] Monday through Friday 8am- 5pm  After hours call [phone]

## 2019-03-28 NOTE — Patient Outreach (Signed)
Member assessed for potential [facility] Care Management needs as a benefit of  [place] [employer].  Member is currently receiving rehab therapy at [facility].  Member discussed in weekly telephonic IDT meeting with  facility staff, [facility] UM team, and writer.  Facility reports member is progressing well with therapy. She lived with her daughter and had CAP aide assistance prior to admission. Daughter will be contact person.  Will plan on outreach to daughter to discuss disposition plans and potential [facility] Care Management services.    Marthenia Rolling, MSN-Ed, RN,BSN [place] Acute Care Coordinator [phone] [facility]) [phone]  (Toll free office)

## 2019-04-03 ENCOUNTER — Encounter: Payer: Self-pay | Admitting: Adult Health

## 2019-04-03 ENCOUNTER — Other Ambulatory Visit: Payer: Self-pay | Admitting: Adult Health

## 2019-04-03 MED ORDER — ALPRAZOLAM 0.5 MG PO TABS: 0.5000 mg | ORAL_TABLET | Freq: Every day | ORAL | 0 refills | Status: DC | PRN

## 2019-04-03 NOTE — Progress Notes (Signed)
Location:  [place] Room Number: 159-P Place of Service:  SNF (31)   CODE STATUS: DNR  No Known Allergies  Chief Complaint  Patient presents with  . Acute Visit    Patient is seen for anxiety    HPI:    Past Medical History:  Diagnosis Date  . Arthritis   . DM type 2 (diabetes mellitus, type 2) ([place])   . Dyslipidemia   . Hiatal hernia 2014  . HTN (hypertension)   . Osteoporosis   . Reflux esophagitis 2014   erosive  . Schatzki's ring 2014  . Tubular adenoma     Past Surgical History:  Procedure Laterality Date  . ABDOMINAL HYSTERECTOMY    . APPENDECTOMY    . BREAST BIOPSY    . COLONOSCOPY N/A 09/08/2012   RMR: tubular adenoma, poor prep, needs surveillance April 2015   . COLONOSCOPY  1990/1992/1996   [facility], multiple hyperplastic polyps  . ESOPHAGOGASTRODUODENOSCOPY N/A 10/09/2015   RMR: pyloric stenosis dilated with scope.  . ESOPHAGOGASTRODUODENOSCOPY (EGD) WITH ESOPHAGEAL DILATION N/A 09/08/2012   RMR: Schatzki's ring s/p 56- F dilation, erosive esophagitis, hiatal hernia, gastric/bulb erosions with mild pyloric stenosis. Negative path  . EYE SURGERY     both  . HIP SURGERY     right x 2  . RECTAL SURGERY     prolapsed rectum/hemorrhoids    Social History   Socioeconomic History  . Marital status: Divorced    Spouse name: Not on file  . Number of children: Not on file  . Years of education: Not on file  . Highest education level: Not on file  Occupational History  . Not on file  Social Needs  . Financial resource strain: Not on file  . Food insecurity    Worry: Not on file    Inability: Not on file  . Transportation needs    Medical: Not on file    Non-medical: Not on file  Tobacco Use  . Smoking status: Never Smoker  . Smokeless tobacco: Never Used  Substance and Sexual Activity  . Alcohol use: No  . Drug use: No  . Sexual activity: Never  Lifestyle  . Physical activity    Days per week:  Not on file    Minutes per session: Not on file  . Stress: Not on file  Relationships  . Social Herbalist on phone: Not on file    Gets together: Not on file    Attends religious service: Not on file    Active member of club or organization: Not on file    Attends meetings of clubs or organizations: Not on file    Relationship status: Not on file  . Intimate partner violence    Fear of current or ex partner: Not on file    Emotionally abused: Not on file    Physically abused: Not on file    Forced sexual activity: Not on file  Other Topics Concern  . Not on file  Social History Narrative  . Not on file   Family History  Problem Relation Age of Onset  . Hypertension Mother   . Hypertension Son   . Colon cancer Neg Hx       VITAL SIGNS BP 121/73   Pulse 78   Temp 98 F (36.7 C) (Oral)   Resp 19   Ht _0  (1.499 m)   Wt 138 lb (62.6 kg)   BMI 27.87 kg/m  Outpatient Encounter Medications as of 04/03/2019  Medication Sig  . acetaminophen (TYLENOL) 650 MG CR tablet Take 650 mg by mouth 2 (two) times daily. For chronic shoulder and back pain  . ALPRAZolam (XANAX) 0.5 MG tablet Take 1 tablet (0.5 mg total) by mouth daily as needed for up to 14 days for anxiety.  . Amino Acids-Protein Hydrolys (FEEDING SUPPLEMENT, PRO-STAT SUGAR FREE 64,) LIQD Take 30 mLs by mouth daily.  . Blood Glucose Monitoring Suppl KIT Use once a day  . diltiazem (CARDIZEM CD) 120 MG 24 hr capsule Take 1 capsule (120 mg total) by mouth daily.  Marland Kitchen donepezil (ARICEPT) 10 MG tablet Take 10 mg by mouth at bedtime.  Marland Kitchen ELIQUIS 2.5 MG TABS tablet TAKE 1 TABLET BY MOUTH TWICE DAILY  . furosemide (LASIX) 20 MG tablet TAKE 1 TABLET BY MOUTH DAILY AS NEEDED FOR swelling  . meclizine (ANTIVERT) 25 MG tablet Take 25 mg by mouth 2 (two) times daily.   . memantine (NAMENDA) 5 MG tablet Take 5 mg by mouth 2 (two) times daily.  . Metoprolol Tartrate 75 MG TABS Take 75 mg by mouth 2 (two) times daily.  .  Multiple Vitamin (MULTIVITAMIN WITH MINERALS) TABS tablet Take 1 tablet by mouth daily.  . Multiple Vitamins-Minerals (PRESERVISION AREDS 2 PO) Take 1 tablet by mouth 2 (two) times daily.  . NON FORMULARY Diet- Liquid: Regular  Diet: Regular NAS, Consistent Carbohydrate  . risperiDONE (RISPERDAL) 0.25 MG tablet Take 0.25 mg by mouth at bedtime.    No facility-administered encounter medications on file as of 04/03/2019.      SIGNIFICANT DIAGNOSTIC EXAMS       ASSESSMENT/ PLAN:    MD is aware of resident's narcotic use and is in agreement with current plan of care. We will attempt to wean resident as appropriate.  Ok Edwards NP [facility] Adult Medicine  Contact [phone] Monday through Friday 8am- 5pm  After hours call [phone]

## 2019-04-04 ENCOUNTER — Other Ambulatory Visit: Payer: Self-pay | Admitting: *Deleted

## 2019-04-04 ENCOUNTER — Encounter: Payer: Self-pay | Admitting: Adult Health

## 2019-04-04 ENCOUNTER — Telehealth (INDEPENDENT_AMBULATORY_CARE_PROVIDER_SITE_OTHER): Payer: Medicare Other | Admitting: Cardiology

## 2019-04-04 ENCOUNTER — Non-Acute Institutional Stay (SKILLED_NURSING_FACILITY): Payer: Medicare Other | Admitting: Adult Health

## 2019-04-04 VITALS — BP 137/81 | HR 77 | Temp 97.8°F | Resp 20 | Ht 59.0 in | Wt 143.0 lb

## 2019-04-04 DIAGNOSIS — F0390 Unspecified dementia without behavioral disturbance: Secondary | ICD-10-CM | POA: Diagnosis not present

## 2019-04-04 DIAGNOSIS — I4891 Unspecified atrial fibrillation: Secondary | ICD-10-CM

## 2019-04-04 DIAGNOSIS — I5032 Chronic diastolic (congestive) heart failure: Secondary | ICD-10-CM

## 2019-04-04 DIAGNOSIS — I10 Essential (primary) hypertension: Secondary | ICD-10-CM

## 2019-04-04 NOTE — Progress Notes (Signed)
Virtual Visit via Telephone Note   This visit type was conducted due to national recommendations for restrictions regarding the COVID-19 Pandemic (e.g. social distancing) in an effort to limit this patient's exposure Amanda mitigate transmission in our community.  Due to her co-morbid illnesses, this patient is at least at moderate risk for complications without adequate follow up.  This format is felt to be most appropriate for this patient at this time.  The patient did not have access to video technology/had technical difficulties with video requiring transitioning to audio format only (telephone).  All issues noted in this document were discussed Amanda addressed.  No physical exam could be performed with this format.  Please refer to the patient's chart for her  consent to telehealth for [facility].   Date:  04/04/2019   ID:  Edmund Hilda, DOB 1925-09-05, MRN [id number]  Patient Location: Home Provider Location: Home  PCP:  Celene Squibb, MD  Cardiologist:  Carlyle Dolly, MD  Electrophysiologist:  None   Evaluation Performed:  Follow-Up Visit  Chief Complaint:  Follow up visit  History of Present Illness:    ADREONNA HINCK is a 85 y.o. female seen today for follow up for the following medical problems.    1. Afib - admit 12/2017 with afib with RVR - started on dilt 120mg  daily, lopressor increased to 100mg  bid.    - isolated episode of palpiations 2 nights ago, better with prn xanax.  - compliant with meds     2. Chronic diastolic HF - occasional LE edema, overall mild - takes lasix prn, will take about once a month which controls her swelling  - reports some mile LE edema recently, has not taken her prn lasix.   3. Vertigo - managed per pcp, has prn meclizine  4. HTN - remains compliant with meds   SH: former wife of Mikaya Giltner, also a patient of mine who passed away in 2015/08/28  The patient does not have symptoms concerning for COVID-19  infection (fever, chills, cough, or new shortness of breath).    Past Medical History:  Diagnosis Date  . Arthritis   . DM type 2 (diabetes mellitus, type 2) ([place])   . Dyslipidemia   . Hiatal hernia 08-27-2012  . HTN (hypertension)   . Osteoporosis   . Reflux esophagitis August 27, 2012   erosive  . Schatzki's ring 2012-08-27  . Tubular adenoma    Past Surgical History:  Procedure Laterality Date  . ABDOMINAL HYSTERECTOMY    . APPENDECTOMY    . BREAST BIOPSY    . COLONOSCOPY N/A 09/08/2012   RMR: tubular adenoma, poor prep, needs surveillance April 2015   . COLONOSCOPY  1990/1992/1996   [facility], multiple hyperplastic polyps  . ESOPHAGOGASTRODUODENOSCOPY N/A 10/09/2015   RMR: pyloric stenosis dilated with scope.  . ESOPHAGOGASTRODUODENOSCOPY (EGD) WITH ESOPHAGEAL DILATION N/A 09/08/2012   RMR: Schatzki's ring s/p 2052-08-27- F dilation, erosive esophagitis, hiatal hernia, gastric/bulb erosions with mild pyloric stenosis. Negative path  . EYE SURGERY     both  . HIP SURGERY     right x 2  . RECTAL SURGERY     prolapsed rectum/hemorrhoids     No outpatient medications have been marked as taking for the 04/04/19 encounter (Appointment) with Arnoldo Lenis, MD.     Allergies:   Patient has no known allergies.   Social History   Tobacco Use  . Smoking status: Never Smoker  . Smokeless tobacco: Never Used  Substance  Use Topics  . Alcohol use: No  . Drug use: No     Family Hx: The patient's family history includes Hypertension in her mother Amanda son. There is no history of Colon cancer.  ROS:   Please see the history of present illness.     All other systems reviewed Amanda are negative.   Prior CV studies:   The following studies were reviewed today:  07/2015 echo Study Conclusions  - Left ventricle: The cavity size was normal. Wall thickness was increased in a pattern of mild LVH. Systolic function was normal. The estimated ejection fraction was in the range of  60% to 65%. Wall motion was normal; there were no regional wall motion abnormalities. Doppler parameters are consistent with abnormal left ventricular relaxation (grade 1 diastolic dysfunction). - Aortic valve: Moderately calcified annulus. Mildly thickened, mildly calcified leaflets. - Mitral valve: Calcified annulus. Mildly thickened, mildly calcified leaflets .   12/2017 echo ------------------------------------------------------------------- Study Conclusions  - Left ventricle: The cavity size was normal. Wall thickness was normal. Systolic function was normal. The estimated ejection fraction was in the range of 50% to 55%. Wall motion was normal; there were no regional wall motion abnormalities. - Aortic valve: Mildly calcified annulus. Trileaflet; mildly thickened leaflets. Valve area (VTI): 0.87 cm^2. Valve area (Vmax): 0.97 cm^2. Valve area (Vmean): 0.88 cm^2. - Mitral valve: Mildly calcified annulus. Mildly thickened leaflets . - Left atrium: The atrium was mildly dilated. - Right atrium: The atrium was mildly dilated. - Atrial septum: No defect or patent foramen ovale was identified.   Labs/Other Tests Amanda Data Reviewed:    EKG:  No ECG reviewed.  Recent Labs: 03/17/2019: B Natriuretic Peptide 240.0 03/18/2019: ALT 65 03/27/2019: BUN 32; Creatinine, Ser 0.91; Hemoglobin 11.1; Platelets 280; Potassium 4.0; Sodium 139   Recent Lipid Panel No results found for: CHOL, TRIG, HDL, CHOLHDL, LDLCALC, LDLDIRECT  Wt Readings from Last 3 Encounters:  04/03/19 138 lb (62.6 kg)  03/28/19 138 lb 3.2 oz (62.7 kg)  03/24/19 135 lb 3.2 oz (61.3 kg)     Objective:    Vital Signs:   Today's Vitals   04/04/19 1103  BP: 137/81  Pulse: 77  Resp: 20  Temp: 97.8 F (36.6 C)  SpO2: 96%  Weight: 143 lb (64.9 kg)  Height: 4\' 11"  (1.499 m)   Body mass index is 28.88 kg/m. Normal affect. Normal speech pattern Amanda tone. Comfortable, no apparent  distress. NO audible signs of SOB or wheezing.   ASSESSMENT & PLAN:    1. Afib -overall doing well, continue current meds  2. Chronic diastolic  -some recent edema, take her lasix 20mg  x 2 days then back to prn  3. HTN - reasonable control given age, continue current meds   F/u 4 months  COVID-19 Education: The signs Amanda symptoms of COVID-19 were discussed with the patient Amanda how to seek care for testing (follow up with PCP or arrange E-visit).  The importance of social distancing was discussed today.  Time:   Today, I have spent 14 minutes with the patient with telehealth technology discussing the above problems.     Medication Adjustments/Labs Amanda Tests Ordered: Current medicines are reviewed at length with the patient today.  Concerns regarding medicines are outlined above.   Tests Ordered: No orders of the defined types were placed in this encounter.   Medication Changes: No orders of the defined types were placed in this encounter.   Follow Up:  In Person in 4 month(s)  Merrily Pew, MD  04/04/2019 8:26 AM    [place]

## 2019-04-04 NOTE — Progress Notes (Signed)
Location:    [facility] Room Number: 151/P Place of Service:  SNF (31)   CODE STATUS: DNR  No Known Allergies  Chief Complaint  Patient presents with  . Acute Visit     Care Plan Meeting    HPI:  We have come together for her care plan meeting. She has family present via phone. BIMS 15/15 mood 6/30. She gaining weight from 138 to her current weight of 143 pounds. There are no falls. She is doing well with therapy. Her goal is to return back home. She does not have any uncontrolled pain; her appetite is good. She continues to be followed for her chronic illnesses including: afib chf dementia.   Past Medical History:  Diagnosis Date  . Arthritis   . DM type 2 (diabetes mellitus, type 2) ([place])   . Dyslipidemia   . Hiatal hernia 2014  . HTN (hypertension)   . Osteoporosis   . Reflux esophagitis 2014   erosive  . Schatzki's ring 2014  . Tubular adenoma     Past Surgical History:  Procedure Laterality Date  . ABDOMINAL HYSTERECTOMY    . APPENDECTOMY    . BREAST BIOPSY    . COLONOSCOPY N/A 09/08/2012   RMR: tubular adenoma, poor prep, needs surveillance April 2015   . COLONOSCOPY  1990/1992/1996   [facility], multiple hyperplastic polyps  . ESOPHAGOGASTRODUODENOSCOPY N/A 10/09/2015   RMR: pyloric stenosis dilated with scope.  . ESOPHAGOGASTRODUODENOSCOPY (EGD) WITH ESOPHAGEAL DILATION N/A 09/08/2012   RMR: Schatzki's ring s/p 54- F dilation, erosive esophagitis, hiatal hernia, gastric/bulb erosions with mild pyloric stenosis. Negative path  . EYE SURGERY     both  . HIP SURGERY     right x 2  . RECTAL SURGERY     prolapsed rectum/hemorrhoids    Social History   Socioeconomic History  . Marital status: Divorced    Spouse name: Not on file  . Number of children: Not on file  . Years of education: Not on file  . Highest education level: Not on file  Occupational History  . Not on file  Social Needs  . Financial resource  strain: Not on file  . Food insecurity    Worry: Not on file    Inability: Not on file  . Transportation needs    Medical: Not on file    Non-medical: Not on file  Tobacco Use  . Smoking status: Never Smoker  . Smokeless tobacco: Never Used  Substance and Sexual Activity  . Alcohol use: No  . Drug use: No  . Sexual activity: Never  Lifestyle  . Physical activity    Days per week: Not on file    Minutes per session: Not on file  . Stress: Not on file  Relationships  . Social Herbalist on phone: Not on file    Gets together: Not on file    Attends religious service: Not on file    Active member of club or organization: Not on file    Attends meetings of clubs or organizations: Not on file    Relationship status: Not on file  . Intimate partner violence    Fear of current or ex partner: Not on file    Emotionally abused: Not on file    Physically abused: Not on file    Forced sexual activity: Not on file  Other Topics Concern  . Not on file  Social History Narrative  . Not  on file   Family History  Problem Relation Age of Onset  . Hypertension Mother   . Hypertension Son   . Colon cancer Neg Hx       VITAL SIGNS BP 137/81   Pulse 77   Temp (!) 97.3 F (36.3 C) (Oral)   Resp 20   Ht '4\' 11"'  (1.499 m)   Wt 143 lb (64.9 kg)   BMI 28.88 kg/m   Outpatient Encounter Medications as of 04/04/2019  Medication Sig  . acetaminophen (TYLENOL) 650 MG CR tablet Take 650 mg by mouth 2 (two) times daily. For chronic shoulder and back pain  . ALPRAZolam (XANAX) 0.5 MG tablet Take 1 tablet (0.5 mg total) by mouth daily as needed for up to 14 days for anxiety.  . Amino Acids-Protein Hydrolys (FEEDING SUPPLEMENT, PRO-STAT SUGAR FREE 64,) LIQD Take 30 mLs by mouth daily.  Roseanne Kaufman [place]-Castor Oil (VENELEX) OINT Apply topically. Apply to bilateral buttocks and sacrum every shift and as needed erythema  . Blood Glucose Monitoring Suppl KIT Use once a day  . diltiazem  (CARDIZEM CD) 120 MG 24 hr capsule Take 1 capsule (120 mg total) by mouth daily.  Marland Kitchen donepezil (ARICEPT) 10 MG tablet Take 10 mg by mouth at bedtime.  Marland Kitchen ELIQUIS 2.5 MG TABS tablet TAKE 1 TABLET BY MOUTH TWICE DAILY  . furosemide (LASIX) 20 MG tablet Give 20 mg by mouth if weight gain of 3 lbs once a day as needed  . meclizine (ANTIVERT) 25 MG tablet Take 25 mg by mouth 2 (two) times daily.   . memantine (NAMENDA) 5 MG tablet Take 5 mg by mouth 2 (two) times daily.  . Metoprolol Tartrate 75 MG TABS Take 75 mg by mouth 2 (two) times daily.  . Multiple Vitamin (MULTIVITAMIN WITH MINERALS) TABS tablet Take 1 tablet by mouth daily.  . Multiple Vitamins-Minerals (PRESERVISION AREDS 2 PO) Take 1 tablet by mouth 2 (two) times daily.  . NON FORMULARY Diet- Liquid: Regular  Diet: Regular NAS, Consistent Carbohydrate  . risperiDONE (RISPERDAL) 0.25 MG tablet Take 0.25 mg by mouth at bedtime.    No facility-administered encounter medications on file as of 04/04/2019.      SIGNIFICANT DIAGNOSTIC EXAMS   PREVIOUS;   03-17-19: chest x-ray; Small left pleural effusion and associated left lung base atelectasis versus infiltrate, similar or slightly increased since the prior radiograph.  03-18-19: ct of head: No acute intracranial abnormality.   03-18-19: ct of chest:  1. Trace bilateral pleural effusions. 2. No focal infiltrate. 3. There is cholelithiasis without secondary signs of acute cholecystitis. Aortic Atherosclerosis   NO NEW EXAMS.   LABS REVIEWED PREVIOUS ;   03-17-19; wbc 6.2; hgb 10.6; hct 35.5; mcv 98.9; plt 238; glucose 202; bun 24; creat 1.01; k+ 3.8; na++ 143; ca 9.3; BNP 240.0 03-18-19: wbc 5.9; hgb 11.2; hct 37.0; mcv 98.9 plt 225; glucose 111; bun 26; creat 0.89; k+ 3.8; na++ 144; ca 9.1; ast 100; alt 65; albumin 3.2; urine culture: e-coli 03-20-19: wbc 6.9; hgb 10.8; hct 36.0; mcv 98.1 plt 249; glucose 147; bun 24; creat 0.98; k+ 4.2; na++ 143; ca 8.8  03-27-19: wbc 7.1; hgb  11.1; hct 36.4; mcv 96.0 plt 280; glucose 140; bun 32; creat 0.91; k+ 4.0; na++ 139; ca 8.2; hgb a1c 6.3   NO NEW LABS.    Review of Systems  Constitutional: Negative for malaise/fatigue.  Respiratory: Negative for cough and shortness of breath.   Cardiovascular: Negative for chest pain,  palpitations and leg swelling.  Gastrointestinal: Negative for abdominal pain, constipation and heartburn.  Musculoskeletal: Negative for back pain, joint pain and myalgias.  Skin: Negative.   Neurological: Negative for dizziness.  Psychiatric/Behavioral: The patient is not nervous/anxious.     Physical Exam Constitutional:      General: She is not in acute distress.    Appearance: She is well-developed. She is not diaphoretic.  Eyes:     Comments:  History of bilateral eye surgery   Neck:     Musculoskeletal: Neck supple.     Thyroid: No thyromegaly.  Cardiovascular:     Rate and Rhythm: Normal rate and regular rhythm.     Pulses: Normal pulses.     Heart sounds: Normal heart sounds.  Pulmonary:     Effort: Pulmonary effort is normal. No respiratory distress.     Breath sounds: Normal breath sounds.  Abdominal:     General: Bowel sounds are normal. There is no distension.     Palpations: Abdomen is soft.     Tenderness: There is no abdominal tenderness.     Comments: History of prolapse rectum and hemorrhoids    Musculoskeletal: Normal range of motion.     Right lower leg: No edema.     Left lower leg: No edema.     Comments: History of right surgery X 2   Lymphadenopathy:     Cervical: No cervical adenopathy.  Skin:    General: Skin is warm and dry.  Neurological:     Mental Status: She is alert and oriented to person, place, and time.  Psychiatric:        Mood and Affect: Mood normal.       ASSESSMENT/ PLAN:  TODAY;   1. Chronic diastolic heart failure 2. Atrial fibrillation with rvr 3. Dementia without behavioral disturbance unspecified dementia type   Will give her  lasix 40 mg one time Will continue therapy as directed Will continue to monitor her status Her goal is to return home.     MD is aware of resident's narcotic use and is in agreement with current plan of care. We will attempt to wean resident as appropriate.  Ok Edwards NP [facility]  Contact [phone] Monday through Friday 8am- 5pm  After hours call [phone]

## 2019-04-04 NOTE — Patient Outreach (Signed)
Member assessed for potential [facility] Care Management needs as a benefit of  [place] [employer].  Member is currently receiving rehab therapy at [facility].  Member discussed in weekly telephonic IDT meeting with facility staff, [facility] UM team, and writer.  Facility reports member will dc on Thursday to home with daughter and Jeannette.   Facility [employer] planner reported previously that member's daughter is the primary contact person for Probation officer to outreach regarding [place] Management services.   Telephone call made to [name] (daughter) at [phone]. No answer. HIPAA compliant voicemail message left requesting call back.    Marthenia Rolling, MSN-Ed, RN,BSN [place] Acute Care Coordinator [phone] [facility]) [phone]  (Toll free office)

## 2019-04-05 ENCOUNTER — Other Ambulatory Visit: Payer: Self-pay | Admitting: *Deleted

## 2019-04-05 ENCOUNTER — Non-Acute Institutional Stay (SKILLED_NURSING_FACILITY): Payer: Medicare Other | Admitting: Adult Health

## 2019-04-05 ENCOUNTER — Encounter: Payer: Self-pay | Admitting: Adult Health

## 2019-04-05 DIAGNOSIS — F0390 Unspecified dementia without behavioral disturbance: Secondary | ICD-10-CM | POA: Diagnosis not present

## 2019-04-05 DIAGNOSIS — N390 Urinary tract infection, site not specified: Secondary | ICD-10-CM

## 2019-04-05 DIAGNOSIS — B9620 Unspecified Escherichia coli [E. coli] as the cause of diseases classified elsewhere: Secondary | ICD-10-CM | POA: Diagnosis not present

## 2019-04-05 DIAGNOSIS — I5032 Chronic diastolic (congestive) heart failure: Secondary | ICD-10-CM

## 2019-04-05 DIAGNOSIS — I10 Essential (primary) hypertension: Secondary | ICD-10-CM

## 2019-04-05 NOTE — Progress Notes (Signed)
Location:  [place] Room Number: 151-P Place of Service:  SNF (31)    CODE STATUS: DNR  No Known Allergies  Chief Complaint  Patient presents with  . Discharge Note    Patient is seen for discharge    HPI:   She is being discharged to home with home health for pt/ot. She will not need any dme. She will need prescriptions written and will need to follow up with medical provider. She was admitted to this facility for short term rehab after hospitalization for uti and weakness. She has participated in therapy. She is now ready to complete her therapy on a home health basis.    Past Medical History:  Diagnosis Date  . Arthritis   . DM type 2 (diabetes mellitus, type 2) ([place])   . Dyslipidemia   . Hiatal hernia 2014  . HTN (hypertension)   . Osteoporosis   . Reflux esophagitis 2014   erosive  . Schatzki's ring 2014  . Tubular adenoma     Past Surgical History:  Procedure Laterality Date  . ABDOMINAL HYSTERECTOMY    . APPENDECTOMY    . BREAST BIOPSY    . COLONOSCOPY N/A 09/08/2012   RMR: tubular adenoma, poor prep, needs surveillance April 2015   . COLONOSCOPY  1990/1992/1996   [facility], multiple hyperplastic polyps  . ESOPHAGOGASTRODUODENOSCOPY N/A 10/09/2015   RMR: pyloric stenosis dilated with scope.  . ESOPHAGOGASTRODUODENOSCOPY (EGD) WITH ESOPHAGEAL DILATION N/A 09/08/2012   RMR: Schatzki's ring s/p 54- F dilation, erosive esophagitis, hiatal hernia, gastric/bulb erosions with mild pyloric stenosis. Negative path  . EYE SURGERY     both  . HIP SURGERY     right x 2  . RECTAL SURGERY     prolapsed rectum/hemorrhoids    Social History   Socioeconomic History  . Marital status: Divorced    Spouse name: Not on file  . Number of children: Not on file  . Years of education: Not on file  . Highest education level: Not on file  Occupational History  . Not on file  Social Needs  . Financial resource strain: Not on file   . Food insecurity    Worry: Not on file    Inability: Not on file  . Transportation needs    Medical: Not on file    Non-medical: Not on file  Tobacco Use  . Smoking status: Never Smoker  . Smokeless tobacco: Never Used  Substance and Sexual Activity  . Alcohol use: No  . Drug use: No  . Sexual activity: Never  Lifestyle  . Physical activity    Days per week: Not on file    Minutes per session: Not on file  . Stress: Not on file  Relationships  . Social Herbalist on phone: Not on file    Gets together: Not on file    Attends religious service: Not on file    Active member of club or organization: Not on file    Attends meetings of clubs or organizations: Not on file    Relationship status: Not on file  . Intimate partner violence    Fear of current or ex partner: Not on file    Emotionally abused: Not on file    Physically abused: Not on file    Forced sexual activity: Not on file  Other Topics Concern  . Not on file  Social History Narrative  . Not on file   Family  History  Problem Relation Age of Onset  . Hypertension Mother   . Hypertension Son   . Colon cancer Neg Hx     VITAL SIGNS BP 137/81   Pulse 77   Temp 97.8 F (36.6 C) (Oral)   Resp 20   Ht '4\' 11"'  (1.499 m)   Wt 141 lb 12.8 oz (64.3 kg)   BMI 28.64 kg/m   Patient's Medications  New Prescriptions   No medications on file  Previous Medications   ACETAMINOPHEN (TYLENOL) 650 MG CR TABLET    Take 650 mg by mouth 2 (two) times daily. For chronic shoulder and back pain   ALPRAZOLAM (XANAX) 0.5 MG TABLET    Take 1 tablet (0.5 mg total) by mouth daily as needed for up to 14 days for anxiety.   AMINO ACIDS-PROTEIN HYDROLYS (FEEDING SUPPLEMENT, PRO-STAT SUGAR FREE 64,) LIQD    Take 30 mLs by mouth daily.   BALSAM PERU-CASTOR OIL (VENELEX) OINT    Apply topically. Apply to bilateral buttocks and sacrum every shift and as needed erythema   BLOOD GLUCOSE MONITORING SUPPL KIT    Use once a day    DILTIAZEM (CARDIZEM CD) 120 MG 24 HR CAPSULE    Take 1 capsule (120 mg total) by mouth daily.   DONEPEZIL (ARICEPT) 10 MG TABLET    Take 10 mg by mouth at bedtime.   ELIQUIS 2.5 MG TABS TABLET    TAKE 1 TABLET BY MOUTH TWICE DAILY   FUROSEMIDE (LASIX) 20 MG TABLET    Give 20 mg by mouth if weight gain of 3 lbs once a day as needed   MECLIZINE (ANTIVERT) 25 MG TABLET    Take 25 mg by mouth 2 (two) times daily.    MEMANTINE (NAMENDA) 5 MG TABLET    Take 5 mg by mouth 2 (two) times daily.   METOPROLOL TARTRATE 75 MG TABS    Take 75 mg by mouth 2 (two) times daily.   MULTIPLE VITAMIN (MULTIVITAMIN WITH MINERALS) TABS TABLET    Take 1 tablet by mouth daily.   MULTIPLE VITAMINS-MINERALS (PRESERVISION AREDS 2 PO)    Take 1 tablet by mouth 2 (two) times daily.   NON FORMULARY    Diet- Liquid: Regular  Diet: Regular NAS, Consistent Carbohydrate   RISPERIDONE (RISPERDAL) 0.25 MG TABLET    Take 0.25 mg by mouth at bedtime.   Modified Medications   No medications on file  Discontinued Medications   No medications on file     SIGNIFICANT DIAGNOSTIC EXAMS   PREVIOUS;   03-17-19: chest x-ray; Small left pleural effusion and associated left lung base atelectasis versus infiltrate, similar or slightly increased since the prior radiograph.  03-18-19: ct of head: No acute intracranial abnormality.   03-18-19: ct of chest:  1. Trace bilateral pleural effusions. 2. No focal infiltrate. 3. There is cholelithiasis without secondary signs of acute cholecystitis. Aortic Atherosclerosis   NO NEW EXAMS.   LABS REVIEWED PREVIOUS ;   03-17-19; wbc 6.2; hgb 10.6; hct 35.5; mcv 98.9; plt 238; glucose 202; bun 24; creat 1.01; k+ 3.8; na++ 143; ca 9.3; BNP 240.0 03-18-19: wbc 5.9; hgb 11.2; hct 37.0; mcv 98.9 plt 225; glucose 111; bun 26; creat 0.89; k+ 3.8; na++ 144; ca 9.1; ast 100; alt 65; albumin 3.2; urine culture: e-coli 03-20-19: wbc 6.9; hgb 10.8; hct 36.0; mcv 98.1 plt 249; glucose 147; bun 24;  creat 0.98; k+ 4.2; na++ 143; ca 8.8  03-27-19: wbc 7.1; hgb 11.1;  hct 36.4; mcv 96.0 plt 280; glucose 140; bun 32; creat 0.91; k+ 4.0; na++ 139; ca 8.2; hgb a1c 6.3   NO NEW LABS.   Review of Systems  Constitutional: Negative for malaise/fatigue.  Respiratory: Negative for cough and shortness of breath.   Cardiovascular: Negative for chest pain, palpitations and leg swelling.  Gastrointestinal: Negative for abdominal pain, constipation and heartburn.  Musculoskeletal: Negative for back pain, joint pain and myalgias.  Skin: Negative.   Neurological: Negative for dizziness.  Psychiatric/Behavioral: The patient is not nervous/anxious.      Physical Exam Constitutional:      General: She is not in acute distress.    Appearance: She is well-developed. She is not diaphoretic.  Eyes:     Comments: History of bilateral eye surgery    Neck:     Musculoskeletal: Neck supple.     Thyroid: No thyromegaly.  Cardiovascular:     Rate and Rhythm: Normal rate and regular rhythm.     Pulses: Normal pulses.     Heart sounds: Normal heart sounds.  Pulmonary:     Effort: Pulmonary effort is normal. No respiratory distress.     Breath sounds: Normal breath sounds.  Abdominal:     General: Bowel sounds are normal. There is no distension.     Palpations: Abdomen is soft.     Tenderness: There is no abdominal tenderness.     Comments:  History of prolapse rectum and hemorrhoids     Musculoskeletal: Normal range of motion.     Right lower leg: No edema.     Left lower leg: No edema.     Comments: History of right surgery X 2   Lymphadenopathy:     Cervical: No cervical adenopathy.  Skin:    General: Skin is warm and dry.  Neurological:     Mental Status: She is alert and oriented to person, place, and time.  Psychiatric:        Mood and Affect: Mood normal.      ASSESSMENT/ PLAN:   Patient is being discharged with the following home health services:  Pt/ot to evaluate and treat as  indicated for gait balance strength adl training.   Patient is being discharged with the following durable medical equipment:  None needed   Patient has been advised to f/u with their PCP in 1-2 weeks to bring them up to date on their rehab stay.  Social services at facility was responsible for arranging this appointment.  Pt was provided with a 30 day supply of prescriptions for medications and refills must be obtained from their PCP.  For controlled substances, a more limited supply may be provided adequate until PCP appointment only.  A 30 day supply of prescription medications with # 10 xanax 0.5 mg tabs sent to [facility]  Time spent with patient: 35 minutes: dme; medications home health.      Ok Edwards NP [facility]  Contact [phone] Monday through Friday 8am- 5pm  After hours call [phone]

## 2019-04-05 NOTE — Patient Outreach (Addendum)
Member assessed for potential [facility] Care Management needs as a benefit of [place] Medicare.  Amanda Hale is currently receiving skilled therapy at [facility].  Amanda Hale is slated for discharge on tomorrow from SNF.  Telephone call made to Amanda Hale (daughter) at [phone] to discuss [place] Management program services. Patient identifiers confirmed. Amanda Hale lives with daughter Amanda Hale.   Explained [facility] Care Management will not interfere or replace services by provided by home health. Facility previously reported member will have [place]. Amanda Hale is agreeable to [place] Management follow up.  Amanda Hale reports member has CAP aide services. However, she states the time of day has changed since member has been in the hospital and SNF. States CAP hours can be increased if needed.   Amanda Hale endorses Amanda Hale a history of CHF, HTN, new recent dx AFIB. Amanda Hale states member no longer has DM. Daughter states she thinks member will have 2 new medications added post SNF.   Will make [place] Management RNCM referral since Amanda Hale will dc from [place] on tomorrow 04/06/19.   Amanda Rolling, MSN-Ed, RN,BSN [place] Acute Care Coordinator [phone] [facility]) [phone]  (Toll free office)

## 2019-04-06 ENCOUNTER — Other Ambulatory Visit: Payer: Self-pay | Admitting: Adult Health

## 2019-04-06 MED ORDER — ALPRAZOLAM 0.5 MG PO TABS: 0.5000 mg | ORAL_TABLET | Freq: Every day | ORAL | 0 refills | Status: AC | PRN

## 2019-04-06 MED ORDER — DONEPEZIL HCL 10 MG PO TABS: 10.0000 mg | ORAL_TABLET | Freq: Every day | ORAL | 0 refills | Status: AC

## 2019-04-06 MED ORDER — METOPROLOL TARTRATE 75 MG PO TABS: 75.0000 mg | ORAL_TABLET | Freq: Two times a day (BID) | ORAL | 0 refills | Status: DC

## 2019-04-06 MED ORDER — DILTIAZEM HCL ER COATED BEADS 120 MG PO CP24: 120.0000 mg | ORAL_CAPSULE | Freq: Every day | ORAL | 0 refills | Status: DC

## 2019-04-06 MED ORDER — MEMANTINE HCL 5 MG PO TABS: 5.0000 mg | ORAL_TABLET | Freq: Two times a day (BID) | ORAL | 0 refills | Status: AC

## 2019-04-06 MED ORDER — APIXABAN 2.5 MG PO TABS: 2.5000 mg | ORAL_TABLET | Freq: Two times a day (BID) | ORAL | 0 refills | Status: DC

## 2019-04-06 MED ORDER — MECLIZINE HCL 25 MG PO TABS: 25.0000 mg | ORAL_TABLET | Freq: Two times a day (BID) | ORAL | 0 refills | Status: AC

## 2019-04-06 MED ORDER — RISPERIDONE 0.25 MG PO TABS: 0.2500 mg | ORAL_TABLET | Freq: Every day | ORAL | 0 refills | Status: DC

## 2019-04-06 MED ORDER — FUROSEMIDE 20 MG PO TABS: 20.0000 mg | ORAL_TABLET | Freq: Every day | ORAL | 0 refills | Status: DC | PRN

## 2019-04-06 NOTE — Progress Notes (Signed)
This encounter was created in error - please disregard.

## 2019-04-10 ENCOUNTER — Other Ambulatory Visit: Payer: Self-pay | Admitting: *Deleted

## 2019-04-10 DIAGNOSIS — R2689 Other abnormalities of gait and mobility: Secondary | ICD-10-CM | POA: Diagnosis not present

## 2019-04-10 DIAGNOSIS — I4891 Unspecified atrial fibrillation: Secondary | ICD-10-CM | POA: Diagnosis not present

## 2019-04-10 DIAGNOSIS — N390 Urinary tract infection, site not specified: Secondary | ICD-10-CM | POA: Diagnosis not present

## 2019-04-10 DIAGNOSIS — G8929 Other chronic pain: Secondary | ICD-10-CM | POA: Diagnosis not present

## 2019-04-10 DIAGNOSIS — M1990 Unspecified osteoarthritis, unspecified site: Secondary | ICD-10-CM | POA: Diagnosis not present

## 2019-04-10 DIAGNOSIS — Z7901 Long term (current) use of anticoagulants: Secondary | ICD-10-CM | POA: Diagnosis not present

## 2019-04-10 DIAGNOSIS — F419 Anxiety disorder, unspecified: Secondary | ICD-10-CM | POA: Diagnosis not present

## 2019-04-10 DIAGNOSIS — I152 Hypertension secondary to endocrine disorders: Secondary | ICD-10-CM | POA: Diagnosis not present

## 2019-04-10 DIAGNOSIS — B9620 Unspecified Escherichia coli [E. coli] as the cause of diseases classified elsewhere: Secondary | ICD-10-CM | POA: Diagnosis not present

## 2019-04-10 DIAGNOSIS — E785 Hyperlipidemia, unspecified: Secondary | ICD-10-CM | POA: Diagnosis not present

## 2019-04-10 DIAGNOSIS — M810 Age-related osteoporosis without current pathological fracture: Secondary | ICD-10-CM | POA: Diagnosis not present

## 2019-04-10 DIAGNOSIS — K219 Gastro-esophageal reflux disease without esophagitis: Secondary | ICD-10-CM | POA: Diagnosis not present

## 2019-04-10 DIAGNOSIS — F0390 Unspecified dementia without behavioral disturbance: Secondary | ICD-10-CM | POA: Diagnosis not present

## 2019-04-10 DIAGNOSIS — R42 Dizziness and giddiness: Secondary | ICD-10-CM | POA: Diagnosis not present

## 2019-04-10 DIAGNOSIS — K449 Diaphragmatic hernia without obstruction or gangrene: Secondary | ICD-10-CM | POA: Diagnosis not present

## 2019-04-10 DIAGNOSIS — E1159 Type 2 diabetes mellitus with other circulatory complications: Secondary | ICD-10-CM | POA: Diagnosis not present

## 2019-04-10 DIAGNOSIS — I5032 Chronic diastolic (congestive) heart failure: Secondary | ICD-10-CM | POA: Diagnosis not present

## 2019-04-10 NOTE — Patient Outreach (Signed)
[facility]) Care Management  04/10/2019  Amanda Hale Oct 27, 1925 [id number]   Subjective: Per chart review, patient has a diagnosis of dementia, and patient's daughter Jun Reifsnyder) is the contact for patient.  Telephone call to patient's home  number, no answer, left HIPAA compliant voicemail message for patient's daughter / designated party release Bary Richard Chauvin), and requested call back.    Objective: Per KPN (Knowledge Performance Now, point of care tool) and chart review, patient hospitalized 03/17/2019 -  03/20/2019 for acute UTI.  Patient discharged from the hospital to [place] for rehab on 03/20/2019, UTI, and weakness, discharged from rehab to home on 04/06/2019.  Patient also has a history of dementia, diabetes (per daughter does not have diabetes anymore), hypertension, chronic congestive diastolic heart failure, Dyslipidemia, Hiatal hernia, Osteoporosis, Reflux esophagitis erosive, Schatzki's ring, and Tubular adenoma.      Assessment: Received Medicare [place] Management Post Acute Care Coordinator referral on 04/05/2019.  Referral source: Marthenia Rolling.  Referral reason: Assign to [facility] for follow up, patient post [place] (SNF)  discharge. Will discharge from [facility] on tomorrow 04/06/19. Lives with daughter who is primary contact. Will have [facility] for home health. History of Congestive Heart Failure, hypertension, atrial fibrillation. May have med changes upon discharge.  Transition of care follow up pending, contact with patient's daughter [name]).     Plan: RNCM will send unsuccessful outreach letter, [facility] pamphlet, will call patient for 2nd telephone outreach attempt within 4 business days, transition of care follow up, and proceed with case closure, within 10 business days if no return call.     Liisa Picone H. Annia Friendly, BSN, [place] Management [facility] Telephonic CM Phone: [phone] Fax:  [phone]

## 2019-04-11 DIAGNOSIS — R42 Dizziness and giddiness: Secondary | ICD-10-CM | POA: Diagnosis not present

## 2019-04-11 DIAGNOSIS — E1159 Type 2 diabetes mellitus with other circulatory complications: Secondary | ICD-10-CM | POA: Diagnosis not present

## 2019-04-11 DIAGNOSIS — R2689 Other abnormalities of gait and mobility: Secondary | ICD-10-CM | POA: Diagnosis not present

## 2019-04-11 DIAGNOSIS — N390 Urinary tract infection, site not specified: Secondary | ICD-10-CM | POA: Diagnosis not present

## 2019-04-11 DIAGNOSIS — I152 Hypertension secondary to endocrine disorders: Secondary | ICD-10-CM | POA: Diagnosis not present

## 2019-04-11 DIAGNOSIS — B9620 Unspecified Escherichia coli [E. coli] as the cause of diseases classified elsewhere: Secondary | ICD-10-CM | POA: Diagnosis not present

## 2019-04-12 ENCOUNTER — Other Ambulatory Visit: Payer: Self-pay | Admitting: *Deleted

## 2019-04-12 ENCOUNTER — Encounter: Payer: Self-pay | Admitting: *Deleted

## 2019-04-12 NOTE — Patient Outreach (Signed)
[facility]) Care Management  04/12/2019  Amanda Hale 1926-02-21 [id number]   Subjective: Telephone call to patient's home number, spoke with patient's daughter / designated party release Amanda Hale), she stated patient's name, date of birth, and address.   Discussed [facility] Care Management Medicare [facility] Liaison Transition of care referral  follow up, daughter voiced understanding, and is in agreement to follow up on patient's behalf.  Daughter states patient is not doing as well as she would have hoped that she would be doing for what she needs for the patient to be doing.  Daughter states patient did very well the first 2 days after discharge from facility, patient now is having a hard time walking for her but is able to participate with therapy.  States patient is receiving home health services, physical therapy, occupational therapies, therapies are going well per therapist, and patient making progress.   States patient does not participate or listen to what daughter wants her to do but will follow directions of others better, and this frustrates the daughter. States patient yells out during the night at times and thinks people are in the house when they are not.  Daughter states she is planning to talk with primary MD regarding patient's progress, patient behaviors, and to see if this is related to patient's dementia or other issues.  States patient has a follow up appointment with primary MD on 04/24/2019.  Daughter states she is aware of signs/ symptoms to report, how to reach provider if needed after hours (primary does not have answer hours services), when to go to ED, and / or call 911.  Daughter states she is very agitated with patient's current status, discussed caregiver respite options, utilization of available hours through CAP (Community Alternatives Program), states patient is eligible for up to 30 hours a week of CAP, daughter voices  understanding, and is in agreement to a referral to [place] Management Social Worker for caregiver respite options/ support/ community resources.   States patient currently receives CAP services on Mondays, Wednesday, Thursdays for approximately 15 hours per week.  Daughter states she has her own health issues, is unable to care for patient if she can not get proper rest, unable to lift patient, if patient is unable to walk, and she wishes to keep patient in her home as long as possible.  Discussed [facility] Care Management Social Workers services may be able to assist with placement in the future if needed, daughter voices understanding, states she will access that service if needed in the future, and decline referral at this time.  Discussed Advanced Directives, advised of [place] Management Social Worker  Advanced Directives document completion benefit, daughter voices understanding, and in agreement to a referral to Education officer, museum will to ask Education officer, museum to send [employer], and follow up on document completion.   Daughter states patient had Advanced Directive documents when they lived in [place] and has been told that they are invalid for [place].    Daughter states is obtaining patient's daily weights with much difficulty, weight was 142 today, and working on finding an easier way to weigh patient.   Daughter states patient  is not able to manage self care and has assistance as needed.  Daughter currently lives with patient and neighbor provide support as needed. Daughter voices understanding of patient's medical diagnosis and treatment plan.   States she is accessing patient's Medicare benefits on patient's behalf, as needed via member  services number on back of card.  Daughter is planning to obtain new blood pressure cuff for patient in the near future because current wrist blood pressure cuff she feels is not giving accurate readings.  Daughter states patient  does not have any education material,  transition of care, care coordination, disease management, disease monitoring, transportation,  or pharmacy needs at this time.  States she is very appreciative of the follow up and is in agreement to receive [place] Management services on patient's behalf.    Objective: Per KPN (Knowledge Performance Now, point of care tool) and chart review, patient hospitalized 03/17/2019 -  03/20/2019 for acute UTI.  Patient discharged from the hospital to [facility] for rehab on 03/20/2019, UTI, and weakness, discharged from rehab to home on 04/06/2019.  Patient also has a history of dementia, diabetes (per daughter does not have diabetes anymore), hypertension, chronic congestive diastolic heart failure, Dyslipidemia, Hiatal hernia, Osteoporosis, Reflux esophagitis erosive, Schatzki's ring, and Tubular adenoma.      Assessment: Received Medicare [place] Management Post Acute Care Coordinator referral on 04/05/2019.  Referral source: Marthenia Rolling.  Referral reason: Assign to [facility] for follow up, patient post [place] (SNF)  discharge. Will discharge from [facility] on tomorrow 04/06/19. Lives with daughter who is primary contact. Will have [facility] for home health. History of Congestive Heart Failure, hypertension, atrial fibrillation. May have med changes upon discharge.  Transition of care follow up completed with patient's daughter Amanda Hale).  Will refer patient to [place] Management Social Worker for caregiver respite options/ support/ community resources, will to ask Social Worker to send [employer], and follow up on document completion.       Plan: RNCM will refer patient to [place] Management Social Worker for caregiver respite options/ support/ community resources, will to ask Social Worker to send [employer], and follow up on document completion.        Nasiah Polinsky H. Annia Friendly, BSN, [place] Management [facility] Telephonic CM Phone:  [phone] Fax: [phone]

## 2019-04-17 ENCOUNTER — Other Ambulatory Visit: Payer: Self-pay

## 2019-04-17 NOTE — Patient Outreach (Signed)
[place]) Care Management  04/17/2019  Amanda Hale February 07, 1926 [id number]   Social work referral received from [facility], Sonda Rumble, on 04/12/19.  "Referral [place] Management Social Worker for caregiver respite options/ support/ community resources, will to ask Social Worker to send [employer], and follow up on document completion." Successful outreach to patient's daughter today.  Daughter reports that patient is receiving 15 hours of CAP services per week, however, she is eligible for 30 hours.  Daughter stated that it would be helpful to have some assistance with patient in the evening/night hours.  She also stated that respite services would be helpful.  Patient has only been receiving CAP services since approximately June.  Daughter was encouraged to contact CAP caseworker to discuss options for additional in-home and respite care and to ensure that patient/daughter are using services to maximum benefit.   Discussed Advance Directives with daughter and mailed Emmi/packet.  Will follow up within the next two weeks to ensure receipt.  Ronn Melena, BSW Social Worker [phone]

## 2019-04-18 ENCOUNTER — Ambulatory Visit: Payer: Medicare Other | Admitting: Urology

## 2019-04-18 DIAGNOSIS — B9620 Unspecified Escherichia coli [E. coli] as the cause of diseases classified elsewhere: Secondary | ICD-10-CM | POA: Diagnosis not present

## 2019-04-18 DIAGNOSIS — R2689 Other abnormalities of gait and mobility: Secondary | ICD-10-CM | POA: Diagnosis not present

## 2019-04-18 DIAGNOSIS — I152 Hypertension secondary to endocrine disorders: Secondary | ICD-10-CM | POA: Diagnosis not present

## 2019-04-18 DIAGNOSIS — E1159 Type 2 diabetes mellitus with other circulatory complications: Secondary | ICD-10-CM | POA: Diagnosis not present

## 2019-04-18 DIAGNOSIS — R42 Dizziness and giddiness: Secondary | ICD-10-CM | POA: Diagnosis not present

## 2019-04-18 DIAGNOSIS — N390 Urinary tract infection, site not specified: Secondary | ICD-10-CM | POA: Diagnosis not present

## 2019-04-21 DIAGNOSIS — B9620 Unspecified Escherichia coli [E. coli] as the cause of diseases classified elsewhere: Secondary | ICD-10-CM | POA: Diagnosis not present

## 2019-04-21 DIAGNOSIS — I152 Hypertension secondary to endocrine disorders: Secondary | ICD-10-CM | POA: Diagnosis not present

## 2019-04-21 DIAGNOSIS — N390 Urinary tract infection, site not specified: Secondary | ICD-10-CM | POA: Diagnosis not present

## 2019-04-21 DIAGNOSIS — R2689 Other abnormalities of gait and mobility: Secondary | ICD-10-CM | POA: Diagnosis not present

## 2019-04-21 DIAGNOSIS — E1159 Type 2 diabetes mellitus with other circulatory complications: Secondary | ICD-10-CM | POA: Diagnosis not present

## 2019-04-21 DIAGNOSIS — R42 Dizziness and giddiness: Secondary | ICD-10-CM | POA: Diagnosis not present

## 2019-04-24 DIAGNOSIS — F411 Generalized anxiety disorder: Secondary | ICD-10-CM | POA: Diagnosis not present

## 2019-04-24 DIAGNOSIS — R42 Dizziness and giddiness: Secondary | ICD-10-CM | POA: Diagnosis not present

## 2019-04-24 DIAGNOSIS — I5032 Chronic diastolic (congestive) heart failure: Secondary | ICD-10-CM | POA: Diagnosis not present

## 2019-04-24 DIAGNOSIS — I4891 Unspecified atrial fibrillation: Secondary | ICD-10-CM | POA: Diagnosis not present

## 2019-04-24 DIAGNOSIS — J90 Pleural effusion, not elsewhere classified: Secondary | ICD-10-CM | POA: Diagnosis not present

## 2019-04-24 DIAGNOSIS — E1169 Type 2 diabetes mellitus with other specified complication: Secondary | ICD-10-CM | POA: Diagnosis not present

## 2019-04-24 DIAGNOSIS — R600 Localized edema: Secondary | ICD-10-CM | POA: Diagnosis not present

## 2019-04-25 DIAGNOSIS — I152 Hypertension secondary to endocrine disorders: Secondary | ICD-10-CM | POA: Diagnosis not present

## 2019-04-25 DIAGNOSIS — E1159 Type 2 diabetes mellitus with other circulatory complications: Secondary | ICD-10-CM | POA: Diagnosis not present

## 2019-04-25 DIAGNOSIS — N390 Urinary tract infection, site not specified: Secondary | ICD-10-CM | POA: Diagnosis not present

## 2019-04-25 DIAGNOSIS — B9620 Unspecified Escherichia coli [E. coli] as the cause of diseases classified elsewhere: Secondary | ICD-10-CM | POA: Diagnosis not present

## 2019-04-25 DIAGNOSIS — R42 Dizziness and giddiness: Secondary | ICD-10-CM | POA: Diagnosis not present

## 2019-04-25 DIAGNOSIS — R2689 Other abnormalities of gait and mobility: Secondary | ICD-10-CM | POA: Diagnosis not present

## 2019-04-27 DIAGNOSIS — B9620 Unspecified Escherichia coli [E. coli] as the cause of diseases classified elsewhere: Secondary | ICD-10-CM | POA: Diagnosis not present

## 2019-04-27 DIAGNOSIS — N390 Urinary tract infection, site not specified: Secondary | ICD-10-CM | POA: Diagnosis not present

## 2019-04-27 DIAGNOSIS — R2689 Other abnormalities of gait and mobility: Secondary | ICD-10-CM | POA: Diagnosis not present

## 2019-04-27 DIAGNOSIS — I152 Hypertension secondary to endocrine disorders: Secondary | ICD-10-CM | POA: Diagnosis not present

## 2019-04-27 DIAGNOSIS — E1159 Type 2 diabetes mellitus with other circulatory complications: Secondary | ICD-10-CM | POA: Diagnosis not present

## 2019-04-27 DIAGNOSIS — R42 Dizziness and giddiness: Secondary | ICD-10-CM | POA: Diagnosis not present

## 2019-04-28 DIAGNOSIS — E1159 Type 2 diabetes mellitus with other circulatory complications: Secondary | ICD-10-CM | POA: Diagnosis not present

## 2019-04-28 DIAGNOSIS — I152 Hypertension secondary to endocrine disorders: Secondary | ICD-10-CM | POA: Diagnosis not present

## 2019-04-28 DIAGNOSIS — R42 Dizziness and giddiness: Secondary | ICD-10-CM | POA: Diagnosis not present

## 2019-04-28 DIAGNOSIS — B9620 Unspecified Escherichia coli [E. coli] as the cause of diseases classified elsewhere: Secondary | ICD-10-CM | POA: Diagnosis not present

## 2019-04-28 DIAGNOSIS — R2689 Other abnormalities of gait and mobility: Secondary | ICD-10-CM | POA: Diagnosis not present

## 2019-04-28 DIAGNOSIS — N390 Urinary tract infection, site not specified: Secondary | ICD-10-CM | POA: Diagnosis not present

## 2019-05-01 ENCOUNTER — Ambulatory Visit: Payer: Self-pay

## 2019-05-01 ENCOUNTER — Other Ambulatory Visit: Payer: Self-pay

## 2019-05-01 NOTE — Patient Outreach (Signed)
[facility]) Care Management  05/01/2019  BUNITA STLAURENT January 17, 1926 [id number]   Attempted to follow up with patient's daughter, [name], today regarding previous conversation about CAP and to ensure receipt of Advance Directive documentation that was mailed on 04/17/19.  Left voicemail message.  Will attempt to reach again within four business days.  Ronn Melena, BSW Social Worker [phone]

## 2019-05-03 ENCOUNTER — Ambulatory Visit: Payer: Self-pay

## 2019-05-03 ENCOUNTER — Other Ambulatory Visit: Payer: Self-pay

## 2019-05-03 DIAGNOSIS — R42 Dizziness and giddiness: Secondary | ICD-10-CM | POA: Diagnosis not present

## 2019-05-03 DIAGNOSIS — B9620 Unspecified Escherichia coli [E. coli] as the cause of diseases classified elsewhere: Secondary | ICD-10-CM | POA: Diagnosis not present

## 2019-05-03 DIAGNOSIS — N390 Urinary tract infection, site not specified: Secondary | ICD-10-CM | POA: Diagnosis not present

## 2019-05-03 DIAGNOSIS — E1159 Type 2 diabetes mellitus with other circulatory complications: Secondary | ICD-10-CM | POA: Diagnosis not present

## 2019-05-03 DIAGNOSIS — R2689 Other abnormalities of gait and mobility: Secondary | ICD-10-CM | POA: Diagnosis not present

## 2019-05-03 DIAGNOSIS — I152 Hypertension secondary to endocrine disorders: Secondary | ICD-10-CM | POA: Diagnosis not present

## 2019-05-03 NOTE — Patient Outreach (Signed)
[place] [facility]) Care Management  05/03/2019  Amanda Hale [birth year] [id number]   Attempted to follow up with patient's daughter, Amanda Hale, today regarding previous conversation about CAP and to ensure receipt of Advance Directive documentation that was mailed on 04/17/19.  Called home number and briefly spoke with patient.  Informed her of reason for follow up call and she asked that I call daughter on her mobile number. Had to leave voicemail message.  Will make final attempt to follow up with daughter within four business days.   Ronn Melena, BSW Social Worker [phone]

## 2019-05-05 DIAGNOSIS — R2689 Other abnormalities of gait and mobility: Secondary | ICD-10-CM | POA: Diagnosis not present

## 2019-05-05 DIAGNOSIS — R42 Dizziness and giddiness: Secondary | ICD-10-CM | POA: Diagnosis not present

## 2019-05-05 DIAGNOSIS — B9620 Unspecified Escherichia coli [E. coli] as the cause of diseases classified elsewhere: Secondary | ICD-10-CM | POA: Diagnosis not present

## 2019-05-05 DIAGNOSIS — N390 Urinary tract infection, site not specified: Secondary | ICD-10-CM | POA: Diagnosis not present

## 2019-05-05 DIAGNOSIS — I152 Hypertension secondary to endocrine disorders: Secondary | ICD-10-CM | POA: Diagnosis not present

## 2019-05-05 DIAGNOSIS — E1159 Type 2 diabetes mellitus with other circulatory complications: Secondary | ICD-10-CM | POA: Diagnosis not present

## 2019-05-08 ENCOUNTER — Ambulatory Visit: Payer: Self-pay

## 2019-05-08 ENCOUNTER — Other Ambulatory Visit: Payer: Self-pay

## 2019-05-08 NOTE — Patient Outreach (Signed)
[name]) Care Management  05/08/2019  Amanda Hale June 27, 1925 [id number]   Second attempt to follow up with patient's daughter, [name], on 05/03/19 regarding previous conversation about CAP and to ensure receipt of Advance Directive documentation that was mailed on 04/17/19. Called home number and briefly spoke with patient.  Informed her of reason for follow up call and she asked that I call daughter on her mobile number. Left third message for daughter today.  Will close case if no response by 05/16/19.     Ronn Melena, BSW Social Worker [phone]

## 2019-05-10 ENCOUNTER — Ambulatory Visit: Payer: Self-pay

## 2019-05-10 DIAGNOSIS — F0390 Unspecified dementia without behavioral disturbance: Secondary | ICD-10-CM | POA: Diagnosis not present

## 2019-05-10 DIAGNOSIS — Z7901 Long term (current) use of anticoagulants: Secondary | ICD-10-CM | POA: Diagnosis not present

## 2019-05-10 DIAGNOSIS — B9620 Unspecified Escherichia coli [E. coli] as the cause of diseases classified elsewhere: Secondary | ICD-10-CM | POA: Diagnosis not present

## 2019-05-10 DIAGNOSIS — E785 Hyperlipidemia, unspecified: Secondary | ICD-10-CM | POA: Diagnosis not present

## 2019-05-10 DIAGNOSIS — M810 Age-related osteoporosis without current pathological fracture: Secondary | ICD-10-CM | POA: Diagnosis not present

## 2019-05-10 DIAGNOSIS — K449 Diaphragmatic hernia without obstruction or gangrene: Secondary | ICD-10-CM | POA: Diagnosis not present

## 2019-05-10 DIAGNOSIS — G8929 Other chronic pain: Secondary | ICD-10-CM | POA: Diagnosis not present

## 2019-05-10 DIAGNOSIS — E1159 Type 2 diabetes mellitus with other circulatory complications: Secondary | ICD-10-CM | POA: Diagnosis not present

## 2019-05-10 DIAGNOSIS — N390 Urinary tract infection, site not specified: Secondary | ICD-10-CM | POA: Diagnosis not present

## 2019-05-10 DIAGNOSIS — I4891 Unspecified atrial fibrillation: Secondary | ICD-10-CM | POA: Diagnosis not present

## 2019-05-10 DIAGNOSIS — K219 Gastro-esophageal reflux disease without esophagitis: Secondary | ICD-10-CM | POA: Diagnosis not present

## 2019-05-10 DIAGNOSIS — M1990 Unspecified osteoarthritis, unspecified site: Secondary | ICD-10-CM | POA: Diagnosis not present

## 2019-05-10 DIAGNOSIS — F419 Anxiety disorder, unspecified: Secondary | ICD-10-CM | POA: Diagnosis not present

## 2019-05-10 DIAGNOSIS — I5032 Chronic diastolic (congestive) heart failure: Secondary | ICD-10-CM | POA: Diagnosis not present

## 2019-05-10 DIAGNOSIS — R2689 Other abnormalities of gait and mobility: Secondary | ICD-10-CM | POA: Diagnosis not present

## 2019-05-10 DIAGNOSIS — I152 Hypertension secondary to endocrine disorders: Secondary | ICD-10-CM | POA: Diagnosis not present

## 2019-05-10 DIAGNOSIS — R42 Dizziness and giddiness: Secondary | ICD-10-CM | POA: Diagnosis not present

## 2019-05-16 ENCOUNTER — Other Ambulatory Visit: Payer: Self-pay

## 2019-05-16 NOTE — Patient Outreach (Signed)
[place] [facility]) Care Management  05/16/2019  JERICHA WHITTINGHAM 1925-09-25 [id number]   THN case closure due to inability to maintain contact.  Ronn Melena, BSW Social Worker [phone]

## 2019-05-17 DIAGNOSIS — B9620 Unspecified Escherichia coli [E. coli] as the cause of diseases classified elsewhere: Secondary | ICD-10-CM | POA: Diagnosis not present

## 2019-05-17 DIAGNOSIS — E1159 Type 2 diabetes mellitus with other circulatory complications: Secondary | ICD-10-CM | POA: Diagnosis not present

## 2019-05-17 DIAGNOSIS — R2689 Other abnormalities of gait and mobility: Secondary | ICD-10-CM | POA: Diagnosis not present

## 2019-05-17 DIAGNOSIS — N390 Urinary tract infection, site not specified: Secondary | ICD-10-CM | POA: Diagnosis not present

## 2019-05-17 DIAGNOSIS — I152 Hypertension secondary to endocrine disorders: Secondary | ICD-10-CM | POA: Diagnosis not present

## 2019-05-17 DIAGNOSIS — R42 Dizziness and giddiness: Secondary | ICD-10-CM | POA: Diagnosis not present

## 2019-05-23 DIAGNOSIS — R42 Dizziness and giddiness: Secondary | ICD-10-CM | POA: Diagnosis not present

## 2019-05-23 DIAGNOSIS — N390 Urinary tract infection, site not specified: Secondary | ICD-10-CM | POA: Diagnosis not present

## 2019-05-23 DIAGNOSIS — R2689 Other abnormalities of gait and mobility: Secondary | ICD-10-CM | POA: Diagnosis not present

## 2019-05-23 DIAGNOSIS — I152 Hypertension secondary to endocrine disorders: Secondary | ICD-10-CM | POA: Diagnosis not present

## 2019-05-23 DIAGNOSIS — B9620 Unspecified Escherichia coli [E. coli] as the cause of diseases classified elsewhere: Secondary | ICD-10-CM | POA: Diagnosis not present

## 2019-05-23 DIAGNOSIS — E1159 Type 2 diabetes mellitus with other circulatory complications: Secondary | ICD-10-CM | POA: Diagnosis not present

## 2019-05-28 ENCOUNTER — Other Ambulatory Visit: Payer: Self-pay | Admitting: Adult Health

## 2019-05-30 DIAGNOSIS — B9620 Unspecified Escherichia coli [E. coli] as the cause of diseases classified elsewhere: Secondary | ICD-10-CM | POA: Diagnosis not present

## 2019-05-30 DIAGNOSIS — E1159 Type 2 diabetes mellitus with other circulatory complications: Secondary | ICD-10-CM | POA: Diagnosis not present

## 2019-05-30 DIAGNOSIS — N390 Urinary tract infection, site not specified: Secondary | ICD-10-CM | POA: Diagnosis not present

## 2019-05-30 DIAGNOSIS — R42 Dizziness and giddiness: Secondary | ICD-10-CM | POA: Diagnosis not present

## 2019-05-30 DIAGNOSIS — I152 Hypertension secondary to endocrine disorders: Secondary | ICD-10-CM | POA: Diagnosis not present

## 2019-05-30 DIAGNOSIS — R2689 Other abnormalities of gait and mobility: Secondary | ICD-10-CM | POA: Diagnosis not present

## 2019-06-07 DIAGNOSIS — N390 Urinary tract infection, site not specified: Secondary | ICD-10-CM | POA: Diagnosis not present

## 2019-06-07 DIAGNOSIS — R42 Dizziness and giddiness: Secondary | ICD-10-CM | POA: Diagnosis not present

## 2019-06-07 DIAGNOSIS — E1159 Type 2 diabetes mellitus with other circulatory complications: Secondary | ICD-10-CM | POA: Diagnosis not present

## 2019-06-07 DIAGNOSIS — I152 Hypertension secondary to endocrine disorders: Secondary | ICD-10-CM | POA: Diagnosis not present

## 2019-06-07 DIAGNOSIS — R2689 Other abnormalities of gait and mobility: Secondary | ICD-10-CM | POA: Diagnosis not present

## 2019-06-07 DIAGNOSIS — B9620 Unspecified Escherichia coli [E. coli] as the cause of diseases classified elsewhere: Secondary | ICD-10-CM | POA: Diagnosis not present

## 2019-06-09 DIAGNOSIS — E1122 Type 2 diabetes mellitus with diabetic chronic kidney disease: Secondary | ICD-10-CM | POA: Diagnosis not present

## 2019-06-09 DIAGNOSIS — E1169 Type 2 diabetes mellitus with other specified complication: Secondary | ICD-10-CM | POA: Diagnosis not present

## 2019-06-09 DIAGNOSIS — E1159 Type 2 diabetes mellitus with other circulatory complications: Secondary | ICD-10-CM | POA: Diagnosis not present

## 2019-06-09 DIAGNOSIS — E782 Mixed hyperlipidemia: Secondary | ICD-10-CM | POA: Diagnosis not present

## 2019-06-09 DIAGNOSIS — I10 Essential (primary) hypertension: Secondary | ICD-10-CM | POA: Diagnosis not present

## 2019-06-09 DIAGNOSIS — E559 Vitamin D deficiency, unspecified: Secondary | ICD-10-CM | POA: Diagnosis not present

## 2019-06-09 DIAGNOSIS — I152 Hypertension secondary to endocrine disorders: Secondary | ICD-10-CM | POA: Diagnosis not present

## 2019-06-13 DIAGNOSIS — F0390 Unspecified dementia without behavioral disturbance: Secondary | ICD-10-CM | POA: Diagnosis not present

## 2019-06-13 DIAGNOSIS — N1831 Chronic kidney disease, stage 3a: Secondary | ICD-10-CM | POA: Diagnosis not present

## 2019-06-13 DIAGNOSIS — E782 Mixed hyperlipidemia: Secondary | ICD-10-CM | POA: Diagnosis not present

## 2019-06-13 DIAGNOSIS — G8929 Other chronic pain: Secondary | ICD-10-CM | POA: Diagnosis not present

## 2019-06-13 DIAGNOSIS — I10 Essential (primary) hypertension: Secondary | ICD-10-CM | POA: Diagnosis not present

## 2019-06-13 DIAGNOSIS — R531 Weakness: Secondary | ICD-10-CM | POA: Diagnosis not present

## 2019-06-13 DIAGNOSIS — M545 Low back pain: Secondary | ICD-10-CM | POA: Diagnosis not present

## 2019-06-13 DIAGNOSIS — R945 Abnormal results of liver function studies: Secondary | ICD-10-CM | POA: Diagnosis not present

## 2019-06-13 DIAGNOSIS — R2689 Other abnormalities of gait and mobility: Secondary | ICD-10-CM | POA: Diagnosis not present

## 2019-06-13 DIAGNOSIS — H3530 Unspecified macular degeneration: Secondary | ICD-10-CM | POA: Diagnosis not present

## 2019-06-13 DIAGNOSIS — M791 Myalgia, unspecified site: Secondary | ICD-10-CM | POA: Diagnosis not present

## 2019-06-13 DIAGNOSIS — E1122 Type 2 diabetes mellitus with diabetic chronic kidney disease: Secondary | ICD-10-CM | POA: Diagnosis not present

## 2019-06-16 DIAGNOSIS — R531 Weakness: Secondary | ICD-10-CM | POA: Diagnosis not present

## 2019-06-16 DIAGNOSIS — M545 Low back pain: Secondary | ICD-10-CM | POA: Diagnosis not present

## 2019-06-16 DIAGNOSIS — R2689 Other abnormalities of gait and mobility: Secondary | ICD-10-CM | POA: Diagnosis not present

## 2019-06-16 DIAGNOSIS — I129 Hypertensive chronic kidney disease with stage 1 through stage 4 chronic kidney disease, or unspecified chronic kidney disease: Secondary | ICD-10-CM | POA: Diagnosis not present

## 2019-06-16 DIAGNOSIS — I10 Essential (primary) hypertension: Secondary | ICD-10-CM | POA: Diagnosis not present

## 2019-06-16 DIAGNOSIS — H3530 Unspecified macular degeneration: Secondary | ICD-10-CM | POA: Diagnosis not present

## 2019-06-16 DIAGNOSIS — N1831 Chronic kidney disease, stage 3a: Secondary | ICD-10-CM | POA: Diagnosis not present

## 2019-06-16 DIAGNOSIS — R945 Abnormal results of liver function studies: Secondary | ICD-10-CM | POA: Diagnosis not present

## 2019-06-16 DIAGNOSIS — E782 Mixed hyperlipidemia: Secondary | ICD-10-CM | POA: Diagnosis not present

## 2019-06-16 DIAGNOSIS — G8929 Other chronic pain: Secondary | ICD-10-CM | POA: Diagnosis not present

## 2019-06-16 DIAGNOSIS — M791 Myalgia, unspecified site: Secondary | ICD-10-CM | POA: Diagnosis not present

## 2019-06-16 DIAGNOSIS — E1122 Type 2 diabetes mellitus with diabetic chronic kidney disease: Secondary | ICD-10-CM | POA: Diagnosis not present

## 2019-06-17 DIAGNOSIS — M545 Low back pain: Secondary | ICD-10-CM | POA: Diagnosis not present

## 2019-06-17 DIAGNOSIS — I130 Hypertensive heart and chronic kidney disease with heart failure and stage 1 through stage 4 chronic kidney disease, or unspecified chronic kidney disease: Secondary | ICD-10-CM | POA: Diagnosis not present

## 2019-06-17 DIAGNOSIS — L89312 Pressure ulcer of right buttock, stage 2: Secondary | ICD-10-CM | POA: Diagnosis not present

## 2019-06-17 DIAGNOSIS — R634 Abnormal weight loss: Secondary | ICD-10-CM | POA: Diagnosis not present

## 2019-06-17 DIAGNOSIS — H814 Vertigo of central origin: Secondary | ICD-10-CM | POA: Diagnosis not present

## 2019-06-17 DIAGNOSIS — Z9181 History of falling: Secondary | ICD-10-CM | POA: Diagnosis not present

## 2019-06-17 DIAGNOSIS — H3530 Unspecified macular degeneration: Secondary | ICD-10-CM | POA: Diagnosis not present

## 2019-06-17 DIAGNOSIS — R531 Weakness: Secondary | ICD-10-CM | POA: Diagnosis not present

## 2019-06-17 DIAGNOSIS — E538 Deficiency of other specified B group vitamins: Secondary | ICD-10-CM | POA: Diagnosis not present

## 2019-06-17 DIAGNOSIS — E559 Vitamin D deficiency, unspecified: Secondary | ICD-10-CM | POA: Diagnosis not present

## 2019-06-17 DIAGNOSIS — F411 Generalized anxiety disorder: Secondary | ICD-10-CM | POA: Diagnosis not present

## 2019-06-17 DIAGNOSIS — R1310 Dysphagia, unspecified: Secondary | ICD-10-CM | POA: Diagnosis not present

## 2019-06-17 DIAGNOSIS — I700 Atherosclerosis of aorta: Secondary | ICD-10-CM | POA: Diagnosis not present

## 2019-06-17 DIAGNOSIS — Z7901 Long term (current) use of anticoagulants: Secondary | ICD-10-CM | POA: Diagnosis not present

## 2019-06-17 DIAGNOSIS — Z7984 Long term (current) use of oral hypoglycemic drugs: Secondary | ICD-10-CM | POA: Diagnosis not present

## 2019-06-17 DIAGNOSIS — N183 Chronic kidney disease, stage 3 unspecified: Secondary | ICD-10-CM | POA: Diagnosis not present

## 2019-06-17 DIAGNOSIS — I4891 Unspecified atrial fibrillation: Secondary | ICD-10-CM | POA: Diagnosis not present

## 2019-06-17 DIAGNOSIS — F0280 Dementia in other diseases classified elsewhere without behavioral disturbance: Secondary | ICD-10-CM | POA: Diagnosis not present

## 2019-06-17 DIAGNOSIS — R440 Auditory hallucinations: Secondary | ICD-10-CM | POA: Diagnosis not present

## 2019-06-17 DIAGNOSIS — L89322 Pressure ulcer of left buttock, stage 2: Secondary | ICD-10-CM | POA: Diagnosis not present

## 2019-06-17 DIAGNOSIS — M810 Age-related osteoporosis without current pathological fracture: Secondary | ICD-10-CM | POA: Diagnosis not present

## 2019-06-17 DIAGNOSIS — G894 Chronic pain syndrome: Secondary | ICD-10-CM | POA: Diagnosis not present

## 2019-06-17 DIAGNOSIS — E1122 Type 2 diabetes mellitus with diabetic chronic kidney disease: Secondary | ICD-10-CM | POA: Diagnosis not present

## 2019-06-17 DIAGNOSIS — I5032 Chronic diastolic (congestive) heart failure: Secondary | ICD-10-CM | POA: Diagnosis not present

## 2019-06-17 DIAGNOSIS — E441 Mild protein-calorie malnutrition: Secondary | ICD-10-CM | POA: Diagnosis not present

## 2019-06-19 DIAGNOSIS — R531 Weakness: Secondary | ICD-10-CM | POA: Diagnosis not present

## 2019-06-19 DIAGNOSIS — L89312 Pressure ulcer of right buttock, stage 2: Secondary | ICD-10-CM | POA: Diagnosis not present

## 2019-06-19 DIAGNOSIS — L89322 Pressure ulcer of left buttock, stage 2: Secondary | ICD-10-CM | POA: Diagnosis not present

## 2019-06-19 DIAGNOSIS — H814 Vertigo of central origin: Secondary | ICD-10-CM | POA: Diagnosis not present

## 2019-06-19 DIAGNOSIS — F0280 Dementia in other diseases classified elsewhere without behavioral disturbance: Secondary | ICD-10-CM | POA: Diagnosis not present

## 2019-06-19 DIAGNOSIS — E538 Deficiency of other specified B group vitamins: Secondary | ICD-10-CM | POA: Diagnosis not present

## 2019-06-21 DIAGNOSIS — L89312 Pressure ulcer of right buttock, stage 2: Secondary | ICD-10-CM | POA: Diagnosis not present

## 2019-06-21 DIAGNOSIS — E538 Deficiency of other specified B group vitamins: Secondary | ICD-10-CM | POA: Diagnosis not present

## 2019-06-21 DIAGNOSIS — H814 Vertigo of central origin: Secondary | ICD-10-CM | POA: Diagnosis not present

## 2019-06-21 DIAGNOSIS — R531 Weakness: Secondary | ICD-10-CM | POA: Diagnosis not present

## 2019-06-21 DIAGNOSIS — L89322 Pressure ulcer of left buttock, stage 2: Secondary | ICD-10-CM | POA: Diagnosis not present

## 2019-06-21 DIAGNOSIS — F0280 Dementia in other diseases classified elsewhere without behavioral disturbance: Secondary | ICD-10-CM | POA: Diagnosis not present

## 2019-06-22 DIAGNOSIS — L89312 Pressure ulcer of right buttock, stage 2: Secondary | ICD-10-CM | POA: Diagnosis not present

## 2019-06-22 DIAGNOSIS — R531 Weakness: Secondary | ICD-10-CM | POA: Diagnosis not present

## 2019-06-22 DIAGNOSIS — F0280 Dementia in other diseases classified elsewhere without behavioral disturbance: Secondary | ICD-10-CM | POA: Diagnosis not present

## 2019-06-22 DIAGNOSIS — L89322 Pressure ulcer of left buttock, stage 2: Secondary | ICD-10-CM | POA: Diagnosis not present

## 2019-06-22 DIAGNOSIS — E538 Deficiency of other specified B group vitamins: Secondary | ICD-10-CM | POA: Diagnosis not present

## 2019-06-22 DIAGNOSIS — H814 Vertigo of central origin: Secondary | ICD-10-CM | POA: Diagnosis not present

## 2019-06-26 DIAGNOSIS — L89312 Pressure ulcer of right buttock, stage 2: Secondary | ICD-10-CM | POA: Diagnosis not present

## 2019-06-26 DIAGNOSIS — E538 Deficiency of other specified B group vitamins: Secondary | ICD-10-CM | POA: Diagnosis not present

## 2019-06-26 DIAGNOSIS — F0280 Dementia in other diseases classified elsewhere without behavioral disturbance: Secondary | ICD-10-CM | POA: Diagnosis not present

## 2019-06-26 DIAGNOSIS — R531 Weakness: Secondary | ICD-10-CM | POA: Diagnosis not present

## 2019-06-26 DIAGNOSIS — L89322 Pressure ulcer of left buttock, stage 2: Secondary | ICD-10-CM | POA: Diagnosis not present

## 2019-06-26 DIAGNOSIS — H814 Vertigo of central origin: Secondary | ICD-10-CM | POA: Diagnosis not present

## 2019-06-27 DIAGNOSIS — L89322 Pressure ulcer of left buttock, stage 2: Secondary | ICD-10-CM | POA: Diagnosis not present

## 2019-06-27 DIAGNOSIS — L89312 Pressure ulcer of right buttock, stage 2: Secondary | ICD-10-CM | POA: Diagnosis not present

## 2019-06-27 DIAGNOSIS — E538 Deficiency of other specified B group vitamins: Secondary | ICD-10-CM | POA: Diagnosis not present

## 2019-06-27 DIAGNOSIS — H814 Vertigo of central origin: Secondary | ICD-10-CM | POA: Diagnosis not present

## 2019-06-27 DIAGNOSIS — F0280 Dementia in other diseases classified elsewhere without behavioral disturbance: Secondary | ICD-10-CM | POA: Diagnosis not present

## 2019-06-27 DIAGNOSIS — R531 Weakness: Secondary | ICD-10-CM | POA: Diagnosis not present

## 2019-06-29 DIAGNOSIS — E538 Deficiency of other specified B group vitamins: Secondary | ICD-10-CM | POA: Diagnosis not present

## 2019-06-29 DIAGNOSIS — L89322 Pressure ulcer of left buttock, stage 2: Secondary | ICD-10-CM | POA: Diagnosis not present

## 2019-06-29 DIAGNOSIS — H814 Vertigo of central origin: Secondary | ICD-10-CM | POA: Diagnosis not present

## 2019-06-29 DIAGNOSIS — L89312 Pressure ulcer of right buttock, stage 2: Secondary | ICD-10-CM | POA: Diagnosis not present

## 2019-06-29 DIAGNOSIS — F0280 Dementia in other diseases classified elsewhere without behavioral disturbance: Secondary | ICD-10-CM | POA: Diagnosis not present

## 2019-06-29 DIAGNOSIS — R531 Weakness: Secondary | ICD-10-CM | POA: Diagnosis not present

## 2019-06-30 DIAGNOSIS — L89312 Pressure ulcer of right buttock, stage 2: Secondary | ICD-10-CM | POA: Diagnosis not present

## 2019-06-30 DIAGNOSIS — F0280 Dementia in other diseases classified elsewhere without behavioral disturbance: Secondary | ICD-10-CM | POA: Diagnosis not present

## 2019-06-30 DIAGNOSIS — R531 Weakness: Secondary | ICD-10-CM | POA: Diagnosis not present

## 2019-06-30 DIAGNOSIS — H814 Vertigo of central origin: Secondary | ICD-10-CM | POA: Diagnosis not present

## 2019-06-30 DIAGNOSIS — L89322 Pressure ulcer of left buttock, stage 2: Secondary | ICD-10-CM | POA: Diagnosis not present

## 2019-06-30 DIAGNOSIS — E538 Deficiency of other specified B group vitamins: Secondary | ICD-10-CM | POA: Diagnosis not present

## 2019-07-04 DIAGNOSIS — L89322 Pressure ulcer of left buttock, stage 2: Secondary | ICD-10-CM | POA: Diagnosis not present

## 2019-07-04 DIAGNOSIS — H814 Vertigo of central origin: Secondary | ICD-10-CM | POA: Diagnosis not present

## 2019-07-04 DIAGNOSIS — E538 Deficiency of other specified B group vitamins: Secondary | ICD-10-CM | POA: Diagnosis not present

## 2019-07-04 DIAGNOSIS — F0280 Dementia in other diseases classified elsewhere without behavioral disturbance: Secondary | ICD-10-CM | POA: Diagnosis not present

## 2019-07-04 DIAGNOSIS — R531 Weakness: Secondary | ICD-10-CM | POA: Diagnosis not present

## 2019-07-04 DIAGNOSIS — L89312 Pressure ulcer of right buttock, stage 2: Secondary | ICD-10-CM | POA: Diagnosis not present

## 2019-07-05 DIAGNOSIS — L89322 Pressure ulcer of left buttock, stage 2: Secondary | ICD-10-CM | POA: Diagnosis not present

## 2019-07-05 DIAGNOSIS — F0280 Dementia in other diseases classified elsewhere without behavioral disturbance: Secondary | ICD-10-CM | POA: Diagnosis not present

## 2019-07-05 DIAGNOSIS — I5032 Chronic diastolic (congestive) heart failure: Secondary | ICD-10-CM | POA: Diagnosis not present

## 2019-07-05 DIAGNOSIS — H814 Vertigo of central origin: Secondary | ICD-10-CM | POA: Diagnosis not present

## 2019-07-05 DIAGNOSIS — F411 Generalized anxiety disorder: Secondary | ICD-10-CM | POA: Diagnosis not present

## 2019-07-05 DIAGNOSIS — R600 Localized edema: Secondary | ICD-10-CM | POA: Diagnosis not present

## 2019-07-05 DIAGNOSIS — R531 Weakness: Secondary | ICD-10-CM | POA: Diagnosis not present

## 2019-07-05 DIAGNOSIS — L89312 Pressure ulcer of right buttock, stage 2: Secondary | ICD-10-CM | POA: Diagnosis not present

## 2019-07-05 DIAGNOSIS — E538 Deficiency of other specified B group vitamins: Secondary | ICD-10-CM | POA: Diagnosis not present

## 2019-07-05 DIAGNOSIS — I4891 Unspecified atrial fibrillation: Secondary | ICD-10-CM | POA: Diagnosis not present

## 2019-07-07 DIAGNOSIS — L89322 Pressure ulcer of left buttock, stage 2: Secondary | ICD-10-CM | POA: Diagnosis not present

## 2019-07-07 DIAGNOSIS — H814 Vertigo of central origin: Secondary | ICD-10-CM | POA: Diagnosis not present

## 2019-07-07 DIAGNOSIS — E538 Deficiency of other specified B group vitamins: Secondary | ICD-10-CM | POA: Diagnosis not present

## 2019-07-07 DIAGNOSIS — L89312 Pressure ulcer of right buttock, stage 2: Secondary | ICD-10-CM | POA: Diagnosis not present

## 2019-07-07 DIAGNOSIS — R531 Weakness: Secondary | ICD-10-CM | POA: Diagnosis not present

## 2019-07-07 DIAGNOSIS — F0280 Dementia in other diseases classified elsewhere without behavioral disturbance: Secondary | ICD-10-CM | POA: Diagnosis not present

## 2019-07-11 DIAGNOSIS — R531 Weakness: Secondary | ICD-10-CM | POA: Diagnosis not present

## 2019-07-11 DIAGNOSIS — L89312 Pressure ulcer of right buttock, stage 2: Secondary | ICD-10-CM | POA: Diagnosis not present

## 2019-07-11 DIAGNOSIS — H814 Vertigo of central origin: Secondary | ICD-10-CM | POA: Diagnosis not present

## 2019-07-11 DIAGNOSIS — E538 Deficiency of other specified B group vitamins: Secondary | ICD-10-CM | POA: Diagnosis not present

## 2019-07-11 DIAGNOSIS — F0280 Dementia in other diseases classified elsewhere without behavioral disturbance: Secondary | ICD-10-CM | POA: Diagnosis not present

## 2019-07-11 DIAGNOSIS — L89322 Pressure ulcer of left buttock, stage 2: Secondary | ICD-10-CM | POA: Diagnosis not present

## 2019-07-12 DIAGNOSIS — M79674 Pain in right toe(s): Secondary | ICD-10-CM | POA: Diagnosis not present

## 2019-07-12 DIAGNOSIS — B351 Tinea unguium: Secondary | ICD-10-CM | POA: Diagnosis not present

## 2019-07-13 DIAGNOSIS — R531 Weakness: Secondary | ICD-10-CM | POA: Diagnosis not present

## 2019-07-13 DIAGNOSIS — F0280 Dementia in other diseases classified elsewhere without behavioral disturbance: Secondary | ICD-10-CM | POA: Diagnosis not present

## 2019-07-13 DIAGNOSIS — E538 Deficiency of other specified B group vitamins: Secondary | ICD-10-CM | POA: Diagnosis not present

## 2019-07-13 DIAGNOSIS — L89322 Pressure ulcer of left buttock, stage 2: Secondary | ICD-10-CM | POA: Diagnosis not present

## 2019-07-13 DIAGNOSIS — L89312 Pressure ulcer of right buttock, stage 2: Secondary | ICD-10-CM | POA: Diagnosis not present

## 2019-07-13 DIAGNOSIS — H814 Vertigo of central origin: Secondary | ICD-10-CM | POA: Diagnosis not present

## 2019-07-17 DIAGNOSIS — H3530 Unspecified macular degeneration: Secondary | ICD-10-CM | POA: Diagnosis not present

## 2019-07-17 DIAGNOSIS — L89322 Pressure ulcer of left buttock, stage 2: Secondary | ICD-10-CM | POA: Diagnosis not present

## 2019-07-17 DIAGNOSIS — Z7984 Long term (current) use of oral hypoglycemic drugs: Secondary | ICD-10-CM | POA: Diagnosis not present

## 2019-07-17 DIAGNOSIS — M810 Age-related osteoporosis without current pathological fracture: Secondary | ICD-10-CM | POA: Diagnosis not present

## 2019-07-17 DIAGNOSIS — E1122 Type 2 diabetes mellitus with diabetic chronic kidney disease: Secondary | ICD-10-CM | POA: Diagnosis not present

## 2019-07-17 DIAGNOSIS — L89312 Pressure ulcer of right buttock, stage 2: Secondary | ICD-10-CM | POA: Diagnosis not present

## 2019-07-17 DIAGNOSIS — R634 Abnormal weight loss: Secondary | ICD-10-CM | POA: Diagnosis not present

## 2019-07-17 DIAGNOSIS — Z7901 Long term (current) use of anticoagulants: Secondary | ICD-10-CM | POA: Diagnosis not present

## 2019-07-17 DIAGNOSIS — R440 Auditory hallucinations: Secondary | ICD-10-CM | POA: Diagnosis not present

## 2019-07-17 DIAGNOSIS — F411 Generalized anxiety disorder: Secondary | ICD-10-CM | POA: Diagnosis not present

## 2019-07-17 DIAGNOSIS — Z9181 History of falling: Secondary | ICD-10-CM | POA: Diagnosis not present

## 2019-07-17 DIAGNOSIS — R531 Weakness: Secondary | ICD-10-CM | POA: Diagnosis not present

## 2019-07-17 DIAGNOSIS — N183 Chronic kidney disease, stage 3 unspecified: Secondary | ICD-10-CM | POA: Diagnosis not present

## 2019-07-17 DIAGNOSIS — E441 Mild protein-calorie malnutrition: Secondary | ICD-10-CM | POA: Diagnosis not present

## 2019-07-17 DIAGNOSIS — I5032 Chronic diastolic (congestive) heart failure: Secondary | ICD-10-CM | POA: Diagnosis not present

## 2019-07-17 DIAGNOSIS — F0280 Dementia in other diseases classified elsewhere without behavioral disturbance: Secondary | ICD-10-CM | POA: Diagnosis not present

## 2019-07-17 DIAGNOSIS — H814 Vertigo of central origin: Secondary | ICD-10-CM | POA: Diagnosis not present

## 2019-07-17 DIAGNOSIS — E559 Vitamin D deficiency, unspecified: Secondary | ICD-10-CM | POA: Diagnosis not present

## 2019-07-17 DIAGNOSIS — M545 Low back pain: Secondary | ICD-10-CM | POA: Diagnosis not present

## 2019-07-17 DIAGNOSIS — I700 Atherosclerosis of aorta: Secondary | ICD-10-CM | POA: Diagnosis not present

## 2019-07-17 DIAGNOSIS — E538 Deficiency of other specified B group vitamins: Secondary | ICD-10-CM | POA: Diagnosis not present

## 2019-07-17 DIAGNOSIS — I4891 Unspecified atrial fibrillation: Secondary | ICD-10-CM | POA: Diagnosis not present

## 2019-07-17 DIAGNOSIS — R1310 Dysphagia, unspecified: Secondary | ICD-10-CM | POA: Diagnosis not present

## 2019-07-17 DIAGNOSIS — G894 Chronic pain syndrome: Secondary | ICD-10-CM | POA: Diagnosis not present

## 2019-07-17 DIAGNOSIS — I130 Hypertensive heart and chronic kidney disease with heart failure and stage 1 through stage 4 chronic kidney disease, or unspecified chronic kidney disease: Secondary | ICD-10-CM | POA: Diagnosis not present

## 2019-07-18 DIAGNOSIS — E538 Deficiency of other specified B group vitamins: Secondary | ICD-10-CM | POA: Diagnosis not present

## 2019-07-18 DIAGNOSIS — R531 Weakness: Secondary | ICD-10-CM | POA: Diagnosis not present

## 2019-07-18 DIAGNOSIS — F0280 Dementia in other diseases classified elsewhere without behavioral disturbance: Secondary | ICD-10-CM | POA: Diagnosis not present

## 2019-07-18 DIAGNOSIS — L89312 Pressure ulcer of right buttock, stage 2: Secondary | ICD-10-CM | POA: Diagnosis not present

## 2019-07-18 DIAGNOSIS — L89322 Pressure ulcer of left buttock, stage 2: Secondary | ICD-10-CM | POA: Diagnosis not present

## 2019-07-18 DIAGNOSIS — H814 Vertigo of central origin: Secondary | ICD-10-CM | POA: Diagnosis not present

## 2019-07-20 DIAGNOSIS — F0280 Dementia in other diseases classified elsewhere without behavioral disturbance: Secondary | ICD-10-CM | POA: Diagnosis not present

## 2019-07-20 DIAGNOSIS — E538 Deficiency of other specified B group vitamins: Secondary | ICD-10-CM | POA: Diagnosis not present

## 2019-07-20 DIAGNOSIS — R531 Weakness: Secondary | ICD-10-CM | POA: Diagnosis not present

## 2019-07-20 DIAGNOSIS — H814 Vertigo of central origin: Secondary | ICD-10-CM | POA: Diagnosis not present

## 2019-07-20 DIAGNOSIS — L89322 Pressure ulcer of left buttock, stage 2: Secondary | ICD-10-CM | POA: Diagnosis not present

## 2019-07-20 DIAGNOSIS — L89312 Pressure ulcer of right buttock, stage 2: Secondary | ICD-10-CM | POA: Diagnosis not present

## 2019-07-25 DIAGNOSIS — L89322 Pressure ulcer of left buttock, stage 2: Secondary | ICD-10-CM | POA: Diagnosis not present

## 2019-07-25 DIAGNOSIS — H814 Vertigo of central origin: Secondary | ICD-10-CM | POA: Diagnosis not present

## 2019-07-25 DIAGNOSIS — R531 Weakness: Secondary | ICD-10-CM | POA: Diagnosis not present

## 2019-07-25 DIAGNOSIS — L89312 Pressure ulcer of right buttock, stage 2: Secondary | ICD-10-CM | POA: Diagnosis not present

## 2019-07-25 DIAGNOSIS — E538 Deficiency of other specified B group vitamins: Secondary | ICD-10-CM | POA: Diagnosis not present

## 2019-07-25 DIAGNOSIS — F0280 Dementia in other diseases classified elsewhere without behavioral disturbance: Secondary | ICD-10-CM | POA: Diagnosis not present

## 2019-07-27 DIAGNOSIS — H814 Vertigo of central origin: Secondary | ICD-10-CM | POA: Diagnosis not present

## 2019-07-27 DIAGNOSIS — L89312 Pressure ulcer of right buttock, stage 2: Secondary | ICD-10-CM | POA: Diagnosis not present

## 2019-07-27 DIAGNOSIS — L89322 Pressure ulcer of left buttock, stage 2: Secondary | ICD-10-CM | POA: Diagnosis not present

## 2019-07-27 DIAGNOSIS — R531 Weakness: Secondary | ICD-10-CM | POA: Diagnosis not present

## 2019-07-27 DIAGNOSIS — E538 Deficiency of other specified B group vitamins: Secondary | ICD-10-CM | POA: Diagnosis not present

## 2019-07-27 DIAGNOSIS — F0280 Dementia in other diseases classified elsewhere without behavioral disturbance: Secondary | ICD-10-CM | POA: Diagnosis not present

## 2019-08-01 DIAGNOSIS — R531 Weakness: Secondary | ICD-10-CM | POA: Diagnosis not present

## 2019-08-01 DIAGNOSIS — F0280 Dementia in other diseases classified elsewhere without behavioral disturbance: Secondary | ICD-10-CM | POA: Diagnosis not present

## 2019-08-01 DIAGNOSIS — H814 Vertigo of central origin: Secondary | ICD-10-CM | POA: Diagnosis not present

## 2019-08-01 DIAGNOSIS — L89322 Pressure ulcer of left buttock, stage 2: Secondary | ICD-10-CM | POA: Diagnosis not present

## 2019-08-01 DIAGNOSIS — E538 Deficiency of other specified B group vitamins: Secondary | ICD-10-CM | POA: Diagnosis not present

## 2019-08-01 DIAGNOSIS — L89312 Pressure ulcer of right buttock, stage 2: Secondary | ICD-10-CM | POA: Diagnosis not present

## 2019-08-02 ENCOUNTER — Other Ambulatory Visit: Payer: Self-pay | Admitting: Adult Health

## 2019-08-08 DIAGNOSIS — L89312 Pressure ulcer of right buttock, stage 2: Secondary | ICD-10-CM | POA: Diagnosis not present

## 2019-08-08 DIAGNOSIS — R531 Weakness: Secondary | ICD-10-CM | POA: Diagnosis not present

## 2019-08-08 DIAGNOSIS — H814 Vertigo of central origin: Secondary | ICD-10-CM | POA: Diagnosis not present

## 2019-08-08 DIAGNOSIS — F0280 Dementia in other diseases classified elsewhere without behavioral disturbance: Secondary | ICD-10-CM | POA: Diagnosis not present

## 2019-08-08 DIAGNOSIS — L89322 Pressure ulcer of left buttock, stage 2: Secondary | ICD-10-CM | POA: Diagnosis not present

## 2019-08-08 DIAGNOSIS — E538 Deficiency of other specified B group vitamins: Secondary | ICD-10-CM | POA: Diagnosis not present

## 2019-08-11 DIAGNOSIS — E559 Vitamin D deficiency, unspecified: Secondary | ICD-10-CM | POA: Diagnosis not present

## 2019-08-11 DIAGNOSIS — F0390 Unspecified dementia without behavioral disturbance: Secondary | ICD-10-CM | POA: Diagnosis not present

## 2019-08-11 DIAGNOSIS — E1159 Type 2 diabetes mellitus with other circulatory complications: Secondary | ICD-10-CM | POA: Diagnosis not present

## 2019-08-11 DIAGNOSIS — E1169 Type 2 diabetes mellitus with other specified complication: Secondary | ICD-10-CM | POA: Diagnosis not present

## 2019-08-11 DIAGNOSIS — E782 Mixed hyperlipidemia: Secondary | ICD-10-CM | POA: Diagnosis not present

## 2019-08-11 DIAGNOSIS — E538 Deficiency of other specified B group vitamins: Secondary | ICD-10-CM | POA: Diagnosis not present

## 2019-08-11 DIAGNOSIS — B9620 Unspecified Escherichia coli [E. coli] as the cause of diseases classified elsewhere: Secondary | ICD-10-CM | POA: Diagnosis not present

## 2019-08-11 DIAGNOSIS — F0280 Dementia in other diseases classified elsewhere without behavioral disturbance: Secondary | ICD-10-CM | POA: Diagnosis not present

## 2019-08-11 DIAGNOSIS — E1122 Type 2 diabetes mellitus with diabetic chronic kidney disease: Secondary | ICD-10-CM | POA: Diagnosis not present

## 2019-08-11 DIAGNOSIS — E441 Mild protein-calorie malnutrition: Secondary | ICD-10-CM | POA: Diagnosis not present

## 2019-08-11 DIAGNOSIS — E46 Unspecified protein-calorie malnutrition: Secondary | ICD-10-CM | POA: Diagnosis not present

## 2019-08-11 DIAGNOSIS — N390 Urinary tract infection, site not specified: Secondary | ICD-10-CM | POA: Diagnosis not present

## 2019-08-11 DIAGNOSIS — D519 Vitamin B12 deficiency anemia, unspecified: Secondary | ICD-10-CM | POA: Diagnosis not present

## 2019-08-15 ENCOUNTER — Ambulatory Visit (INDEPENDENT_AMBULATORY_CARE_PROVIDER_SITE_OTHER): Payer: Medicare Other | Admitting: Cardiology

## 2019-08-15 ENCOUNTER — Encounter: Payer: Self-pay | Admitting: Cardiology

## 2019-08-15 ENCOUNTER — Other Ambulatory Visit: Payer: Self-pay

## 2019-08-15 VITALS — BP 98/60 | HR 88 | Ht 59.0 in | Wt 133.0 lb

## 2019-08-15 DIAGNOSIS — I4891 Unspecified atrial fibrillation: Secondary | ICD-10-CM | POA: Diagnosis not present

## 2019-08-15 DIAGNOSIS — I10 Essential (primary) hypertension: Secondary | ICD-10-CM | POA: Diagnosis not present

## 2019-08-15 DIAGNOSIS — I5032 Chronic diastolic (congestive) heart failure: Secondary | ICD-10-CM

## 2019-08-15 MED ORDER — METOPROLOL TARTRATE 50 MG PO TABS: ORAL_TABLET | ORAL | 1 refills | Status: DC

## 2019-08-15 NOTE — Patient Instructions (Signed)
Your physician wants you to follow-up in: [place] will receive a reminder letter in the mail two months in advance. If you don't receive a letter, please call our office to schedule the follow-up appointment.  Your physician has recommended you make the following change in your medication:   TAKE METOPROLOL 75 MG IN THE MORNING AND 100 MG IN THE EVENING   Thank you for choosing [facility]!!

## 2019-08-15 NOTE — Progress Notes (Signed)
Clinical Summary Amanda Hale is a 86 y.o.female seen today for follow up for the following medical problems.    1. Afib - admit 12/2017 with afib with RVR - started on dilt 165m daily, lopressor increased to 1011mbid.  -palpitations often occur in early AM. Better with xanax.  - no bleeding on eliquis   2. Chronic diastolic HF  - no recent LE edema, no SOB/DOE  3. Vertigo - managed per pcp, has prn meclizine  4. HTN - she is compliant with meds   Does not want covid vaccine.   SH: former wife of Amanda Mariclealso a patient of minewho passed away in 20Mar 03, 2017 Past Medical History:  Diagnosis Date  . Arthritis   . DM type 2 (diabetes mellitus, type 2) ([facility]  . Dyslipidemia   . Hiatal hernia 2003-Mar-2014. HTN (hypertension)   . Osteoporosis   . Reflux esophagitis 2003-07-2012 erosive  . Schatzki's ring 2003-Mar-2014. Tubular adenoma      No Known Allergies   Current Outpatient Medications  Medication Sig Dispense Refill  . acetaminophen (TYLENOL) 650 MG CR tablet Take 650 mg by mouth 2 (two) times daily. For chronic shoulder and back pain    . Amino Acids-Protein Hydrolys (FEEDING SUPPLEMENT, PRO-STAT SUGAR FREE 64,) LIQD Take 30 mLs by mouth daily.    . Marland Kitchenpixaban (ELIQUIS) 2.5 MG TABS tablet Take 1 tablet (2.5 mg total) by mouth 2 (two) times daily. 60 tablet 0  . Balsam [place]-Castor Oil (VENELEX) OINT Apply topically. Apply to bilateral buttocks and sacrum every shift and as needed erythema    . Blood Glucose Monitoring Suppl KIT Use once a day    . diltiazem (CARDIZEM CD) 120 MG 24 hr capsule Take 1 capsule (120 mg total) by mouth daily. 30 capsule 0  . donepezil (ARICEPT) 10 MG tablet Take 1 tablet (10 mg total) by mouth at bedtime. 30 tablet 0  . furosemide (LASIX) 20 MG tablet Take 1 tablet (20 mg total) by mouth daily as needed. Give 20 mg by mouth if weight gain of 3 lbs once a day as needed 30 tablet 0  . meclizine (ANTIVERT) 25 MG tablet Take 1  tablet (25 mg total) by mouth 2 (two) times daily. 60 tablet 0  . memantine (NAMENDA) 5 MG tablet Take 1 tablet (5 mg total) by mouth 2 (two) times daily. 60 tablet 0  . Metoprolol Tartrate 75 MG TABS Take 75 mg by mouth 2 (two) times daily. 60 tablet 0  . Multiple Vitamin (MULTIVITAMIN WITH MINERALS) TABS tablet Take 1 tablet by mouth daily.    . Multiple Vitamins-Minerals (PRESERVISION AREDS 2 PO) Take 1 tablet by mouth 2 (two) times daily.    . NON FORMULARY Diet- Liquid: Regular  Diet: Regular NAS, Consistent Carbohydrate    . risperiDONE (RISPERDAL) 0.25 MG tablet Take 1 tablet (0.25 mg total) by mouth at bedtime. 30 tablet 0   No current facility-administered medications for this visit.     Past Surgical History:  Procedure Laterality Date  . ABDOMINAL HYSTERECTOMY    . APPENDECTOMY    . BREAST BIOPSY    . COLONOSCOPY N/A 09/08/2012   RMR: tubular adenoma, poor prep, needs surveillance April 2015   . [facility]  [facility]  . ESOPHAGOGASTRODUODENOSCOPY N/A 10/09/2015   RMR: pyloric stenosis dilated with scope.  . ESOPHAGOGASTRODUODENOSCOPY (EGD) WITH ESOPHAGEAL DILATION N/A 09/08/2012  RMR: Schatzki's ring s/p 103- F dilation, erosive esophagitis, hiatal hernia, gastric/bulb erosions with mild pyloric stenosis. Negative path  . EYE SURGERY     both  . HIP SURGERY     right x 2  . RECTAL SURGERY     prolapsed rectum/hemorrhoids     No Known Allergies    Family History  Problem Relation Age of Onset  . Hypertension Mother   . Hypertension Son   . Colon cancer Neg Hx      Social History Amanda Hale reports that she has never smoked. She has never used smokeless tobacco. Amanda Hale reports no history of alcohol use.   Review of Systems CONSTITUTIONAL: No weight loss, fever, chills, weakness or fatigue.  HEENT: Eyes: No visual loss, blurred vision, double vision or yellow sclerae.No hearing loss,  sneezing, congestion, runny nose or sore throat.  SKIN: No rash or itching.  CARDIOVASCULAR: per hpi RESPIRATORY: No shortness of breath, cough or sputum.  GASTROINTESTINAL: No anorexia, nausea, vomiting or diarrhea. No abdominal pain or blood.  GENITOURINARY: No burning on urination, no polyuria NEUROLOGICAL: No headache, dizziness, syncope, paralysis, ataxia, numbness or tingling in the extremities. No change in bowel or bladder control.  MUSCULOSKELETAL: No muscle, back pain, joint pain or stiffness.  LYMPHATICS: No enlarged nodes. No history of splenectomy.  PSYCHIATRIC: No history of depression or anxiety.  ENDOCRINOLOGIC: No reports of sweating, cold or heat intolerance. No polyuria or polydipsia.  Marland Kitchen   Physical Examination Today's Vitals   08/15/19 1416  BP: 98/60  Pulse: 88  SpO2: 98%  Weight: 133 lb (60.3 kg)  Height: '4\' 11"'  (1.499 m)   Body mass index is 26.86 kg/m.  Gen: resting comfortably, no acute distress HEENT: no scleral icterus, pupils equal round and reactive, no palptable cervical adenopathy,  CV: RRR, no mrg/, no jvd Resp: Clear to auscultation bilaterally GI: abdomen is soft, non-tender, non-distended, normal bowel sounds, no hepatosplenomegaly MSK: extremities are warm, no edema.  Skin: warm, no rash Neuro:  no focal deficits Psych: appropriate affect   Diagnostic Studies 07/2015 echo Study Conclusions  - Left ventricle: The cavity size was normal. Wall thickness was increased in a pattern of mild LVH. Systolic function was normal. The estimated ejection fraction was in the range of 60% to 65%. Wall motion was normal; there were no regional wall motion abnormalities. Doppler parameters are consistent with abnormal left ventricular relaxation (grade 1 diastolic dysfunction). - Aortic valve: Moderately calcified annulus. Mildly thickened, mildly calcified leaflets. - Mitral valve: Calcified annulus. Mildly thickened, mildly calcified  leaflets .   12/2017 echo ------------------------------------------------------------------- Study Conclusions  - Left ventricle: The cavity size was normal. Wall thickness was normal. Systolic function was normal. The estimated ejection fraction was in the range of 50% to 55%. Wall motion was normal; there were no regional wall motion abnormalities. - Aortic valve: Mildly calcified annulus. Trileaflet; mildly thickened leaflets. Valve area (VTI): 0.87 cm^2. Valve area (Vmax): 0.97 cm^2. Valve area (Vmean): 0.88 cm^2. - Mitral valve: Mildly calcified annulus. Mildly thickened leaflets . - Left atrium: The atrium was mildly dilated. - Right atrium: The atrium was mildly dilated. - Atrial septum: No defect or patent foramen ovale was identified.     Assessment and Plan  1. Afib - some palpitations in the early AM, we will increase her evening lopressor to 146m so more in her system in early AM hours, continue 71min AM.  2. Chronic diastolic  -doing well, continue current meds  3. HTN - continue current meds   F/u 6 months      Arnoldo Lenis, M.D.

## 2019-08-18 ENCOUNTER — Ambulatory Visit: Payer: Medicare Other | Admitting: Cardiology

## 2019-08-26 ENCOUNTER — Other Ambulatory Visit: Payer: Self-pay | Admitting: Adult Health

## 2019-09-19 DIAGNOSIS — M79675 Pain in left toe(s): Secondary | ICD-10-CM | POA: Diagnosis not present

## 2019-09-19 DIAGNOSIS — E782 Mixed hyperlipidemia: Secondary | ICD-10-CM | POA: Diagnosis not present

## 2019-09-19 DIAGNOSIS — E559 Vitamin D deficiency, unspecified: Secondary | ICD-10-CM | POA: Diagnosis not present

## 2019-09-19 DIAGNOSIS — F0390 Unspecified dementia without behavioral disturbance: Secondary | ICD-10-CM | POA: Diagnosis not present

## 2019-09-19 DIAGNOSIS — D519 Vitamin B12 deficiency anemia, unspecified: Secondary | ICD-10-CM | POA: Diagnosis not present

## 2019-09-19 DIAGNOSIS — F0280 Dementia in other diseases classified elsewhere without behavioral disturbance: Secondary | ICD-10-CM | POA: Diagnosis not present

## 2019-09-19 DIAGNOSIS — B351 Tinea unguium: Secondary | ICD-10-CM | POA: Diagnosis not present

## 2019-09-19 DIAGNOSIS — E441 Mild protein-calorie malnutrition: Secondary | ICD-10-CM | POA: Diagnosis not present

## 2019-09-19 DIAGNOSIS — E1122 Type 2 diabetes mellitus with diabetic chronic kidney disease: Secondary | ICD-10-CM | POA: Diagnosis not present

## 2019-09-19 DIAGNOSIS — M79674 Pain in right toe(s): Secondary | ICD-10-CM | POA: Diagnosis not present

## 2019-09-19 DIAGNOSIS — E46 Unspecified protein-calorie malnutrition: Secondary | ICD-10-CM | POA: Diagnosis not present

## 2019-09-19 DIAGNOSIS — E1159 Type 2 diabetes mellitus with other circulatory complications: Secondary | ICD-10-CM | POA: Diagnosis not present

## 2019-09-19 DIAGNOSIS — E538 Deficiency of other specified B group vitamins: Secondary | ICD-10-CM | POA: Diagnosis not present

## 2019-09-19 DIAGNOSIS — B9620 Unspecified Escherichia coli [E. coli] as the cause of diseases classified elsewhere: Secondary | ICD-10-CM | POA: Diagnosis not present

## 2019-09-19 DIAGNOSIS — E1169 Type 2 diabetes mellitus with other specified complication: Secondary | ICD-10-CM | POA: Diagnosis not present

## 2019-09-21 DIAGNOSIS — R531 Weakness: Secondary | ICD-10-CM | POA: Diagnosis not present

## 2019-09-21 DIAGNOSIS — N1831 Chronic kidney disease, stage 3a: Secondary | ICD-10-CM | POA: Diagnosis not present

## 2019-09-21 DIAGNOSIS — E782 Mixed hyperlipidemia: Secondary | ICD-10-CM | POA: Diagnosis not present

## 2019-09-21 DIAGNOSIS — F0390 Unspecified dementia without behavioral disturbance: Secondary | ICD-10-CM | POA: Diagnosis not present

## 2019-09-21 DIAGNOSIS — H3530 Unspecified macular degeneration: Secondary | ICD-10-CM | POA: Diagnosis not present

## 2019-09-21 DIAGNOSIS — R945 Abnormal results of liver function studies: Secondary | ICD-10-CM | POA: Diagnosis not present

## 2019-09-21 DIAGNOSIS — R2689 Other abnormalities of gait and mobility: Secondary | ICD-10-CM | POA: Diagnosis not present

## 2019-09-21 DIAGNOSIS — G8929 Other chronic pain: Secondary | ICD-10-CM | POA: Diagnosis not present

## 2019-09-21 DIAGNOSIS — E1122 Type 2 diabetes mellitus with diabetic chronic kidney disease: Secondary | ICD-10-CM | POA: Diagnosis not present

## 2019-09-21 DIAGNOSIS — M545 Low back pain: Secondary | ICD-10-CM | POA: Diagnosis not present

## 2019-09-21 DIAGNOSIS — I10 Essential (primary) hypertension: Secondary | ICD-10-CM | POA: Diagnosis not present

## 2019-09-21 DIAGNOSIS — M791 Myalgia, unspecified site: Secondary | ICD-10-CM | POA: Diagnosis not present

## 2019-09-28 ENCOUNTER — Other Ambulatory Visit: Payer: Self-pay | Admitting: Adult Health

## 2019-10-12 DIAGNOSIS — E1169 Type 2 diabetes mellitus with other specified complication: Secondary | ICD-10-CM | POA: Diagnosis not present

## 2019-10-12 DIAGNOSIS — R4181 Age-related cognitive decline: Secondary | ICD-10-CM | POA: Diagnosis not present

## 2019-10-12 DIAGNOSIS — E46 Unspecified protein-calorie malnutrition: Secondary | ICD-10-CM | POA: Diagnosis not present

## 2019-10-12 DIAGNOSIS — E782 Mixed hyperlipidemia: Secondary | ICD-10-CM | POA: Diagnosis not present

## 2019-10-12 DIAGNOSIS — E559 Vitamin D deficiency, unspecified: Secondary | ICD-10-CM | POA: Diagnosis not present

## 2019-10-12 DIAGNOSIS — E441 Mild protein-calorie malnutrition: Secondary | ICD-10-CM | POA: Diagnosis not present

## 2019-10-12 DIAGNOSIS — E1159 Type 2 diabetes mellitus with other circulatory complications: Secondary | ICD-10-CM | POA: Diagnosis not present

## 2019-10-12 DIAGNOSIS — F0390 Unspecified dementia without behavioral disturbance: Secondary | ICD-10-CM | POA: Diagnosis not present

## 2019-10-12 DIAGNOSIS — F0280 Dementia in other diseases classified elsewhere without behavioral disturbance: Secondary | ICD-10-CM | POA: Diagnosis not present

## 2019-10-12 DIAGNOSIS — E1122 Type 2 diabetes mellitus with diabetic chronic kidney disease: Secondary | ICD-10-CM | POA: Diagnosis not present

## 2019-10-12 DIAGNOSIS — E538 Deficiency of other specified B group vitamins: Secondary | ICD-10-CM | POA: Diagnosis not present

## 2019-10-12 DIAGNOSIS — D519 Vitamin B12 deficiency anemia, unspecified: Secondary | ICD-10-CM | POA: Diagnosis not present

## 2019-10-12 DIAGNOSIS — B9620 Unspecified Escherichia coli [E. coli] as the cause of diseases classified elsewhere: Secondary | ICD-10-CM | POA: Diagnosis not present

## 2019-10-19 DIAGNOSIS — W19XXXA Unspecified fall, initial encounter: Secondary | ICD-10-CM | POA: Diagnosis not present

## 2019-10-19 DIAGNOSIS — T1490XA Injury, unspecified, initial encounter: Secondary | ICD-10-CM | POA: Diagnosis not present

## 2019-10-19 DIAGNOSIS — R5381 Other malaise: Secondary | ICD-10-CM | POA: Diagnosis not present

## 2019-10-25 DIAGNOSIS — I482 Chronic atrial fibrillation, unspecified: Secondary | ICD-10-CM | POA: Diagnosis not present

## 2019-10-25 DIAGNOSIS — F0151 Vascular dementia with behavioral disturbance: Secondary | ICD-10-CM | POA: Diagnosis not present

## 2019-10-25 DIAGNOSIS — I959 Hypotension, unspecified: Secondary | ICD-10-CM | POA: Diagnosis not present

## 2019-12-13 DIAGNOSIS — M810 Age-related osteoporosis without current pathological fracture: Secondary | ICD-10-CM | POA: Diagnosis not present

## 2019-12-13 DIAGNOSIS — I509 Heart failure, unspecified: Secondary | ICD-10-CM | POA: Diagnosis not present

## 2019-12-13 DIAGNOSIS — E559 Vitamin D deficiency, unspecified: Secondary | ICD-10-CM | POA: Diagnosis not present

## 2019-12-13 DIAGNOSIS — I4891 Unspecified atrial fibrillation: Secondary | ICD-10-CM | POA: Diagnosis not present

## 2019-12-13 DIAGNOSIS — Z7901 Long term (current) use of anticoagulants: Secondary | ICD-10-CM | POA: Diagnosis not present

## 2019-12-13 DIAGNOSIS — R531 Weakness: Secondary | ICD-10-CM | POA: Diagnosis not present

## 2019-12-13 DIAGNOSIS — F411 Generalized anxiety disorder: Secondary | ICD-10-CM | POA: Diagnosis not present

## 2019-12-13 DIAGNOSIS — I959 Hypotension, unspecified: Secondary | ICD-10-CM | POA: Diagnosis not present

## 2019-12-13 DIAGNOSIS — R296 Repeated falls: Secondary | ICD-10-CM | POA: Diagnosis not present

## 2019-12-13 DIAGNOSIS — M542 Cervicalgia: Secondary | ICD-10-CM | POA: Diagnosis not present

## 2019-12-13 DIAGNOSIS — F0151 Vascular dementia with behavioral disturbance: Secondary | ICD-10-CM | POA: Diagnosis not present

## 2019-12-18 DIAGNOSIS — E559 Vitamin D deficiency, unspecified: Secondary | ICD-10-CM | POA: Diagnosis not present

## 2019-12-18 DIAGNOSIS — F0151 Vascular dementia with behavioral disturbance: Secondary | ICD-10-CM | POA: Diagnosis not present

## 2019-12-18 DIAGNOSIS — M810 Age-related osteoporosis without current pathological fracture: Secondary | ICD-10-CM | POA: Diagnosis not present

## 2019-12-18 DIAGNOSIS — I959 Hypotension, unspecified: Secondary | ICD-10-CM | POA: Diagnosis not present

## 2019-12-18 DIAGNOSIS — R296 Repeated falls: Secondary | ICD-10-CM | POA: Diagnosis not present

## 2019-12-18 DIAGNOSIS — I4891 Unspecified atrial fibrillation: Secondary | ICD-10-CM | POA: Diagnosis not present

## 2019-12-21 DIAGNOSIS — I4891 Unspecified atrial fibrillation: Secondary | ICD-10-CM | POA: Diagnosis not present

## 2019-12-21 DIAGNOSIS — M810 Age-related osteoporosis without current pathological fracture: Secondary | ICD-10-CM | POA: Diagnosis not present

## 2019-12-21 DIAGNOSIS — E559 Vitamin D deficiency, unspecified: Secondary | ICD-10-CM | POA: Diagnosis not present

## 2019-12-21 DIAGNOSIS — F0151 Vascular dementia with behavioral disturbance: Secondary | ICD-10-CM | POA: Diagnosis not present

## 2019-12-21 DIAGNOSIS — I959 Hypotension, unspecified: Secondary | ICD-10-CM | POA: Diagnosis not present

## 2019-12-21 DIAGNOSIS — R296 Repeated falls: Secondary | ICD-10-CM | POA: Diagnosis not present

## 2019-12-25 DIAGNOSIS — I959 Hypotension, unspecified: Secondary | ICD-10-CM | POA: Diagnosis not present

## 2019-12-25 DIAGNOSIS — R296 Repeated falls: Secondary | ICD-10-CM | POA: Diagnosis not present

## 2019-12-25 DIAGNOSIS — E559 Vitamin D deficiency, unspecified: Secondary | ICD-10-CM | POA: Diagnosis not present

## 2019-12-25 DIAGNOSIS — F0151 Vascular dementia with behavioral disturbance: Secondary | ICD-10-CM | POA: Diagnosis not present

## 2019-12-25 DIAGNOSIS — M810 Age-related osteoporosis without current pathological fracture: Secondary | ICD-10-CM | POA: Diagnosis not present

## 2019-12-25 DIAGNOSIS — I4891 Unspecified atrial fibrillation: Secondary | ICD-10-CM | POA: Diagnosis not present

## 2019-12-27 DIAGNOSIS — I959 Hypotension, unspecified: Secondary | ICD-10-CM | POA: Diagnosis not present

## 2019-12-27 DIAGNOSIS — M810 Age-related osteoporosis without current pathological fracture: Secondary | ICD-10-CM | POA: Diagnosis not present

## 2019-12-27 DIAGNOSIS — R296 Repeated falls: Secondary | ICD-10-CM | POA: Diagnosis not present

## 2019-12-27 DIAGNOSIS — F0151 Vascular dementia with behavioral disturbance: Secondary | ICD-10-CM | POA: Diagnosis not present

## 2019-12-27 DIAGNOSIS — I4891 Unspecified atrial fibrillation: Secondary | ICD-10-CM | POA: Diagnosis not present

## 2019-12-27 DIAGNOSIS — E559 Vitamin D deficiency, unspecified: Secondary | ICD-10-CM | POA: Diagnosis not present

## 2020-01-01 DIAGNOSIS — E559 Vitamin D deficiency, unspecified: Secondary | ICD-10-CM | POA: Diagnosis not present

## 2020-01-01 DIAGNOSIS — F0151 Vascular dementia with behavioral disturbance: Secondary | ICD-10-CM | POA: Diagnosis not present

## 2020-01-01 DIAGNOSIS — R296 Repeated falls: Secondary | ICD-10-CM | POA: Diagnosis not present

## 2020-01-01 DIAGNOSIS — I4891 Unspecified atrial fibrillation: Secondary | ICD-10-CM | POA: Diagnosis not present

## 2020-01-01 DIAGNOSIS — I959 Hypotension, unspecified: Secondary | ICD-10-CM | POA: Diagnosis not present

## 2020-01-01 DIAGNOSIS — M810 Age-related osteoporosis without current pathological fracture: Secondary | ICD-10-CM | POA: Diagnosis not present

## 2020-01-03 DIAGNOSIS — E559 Vitamin D deficiency, unspecified: Secondary | ICD-10-CM | POA: Diagnosis not present

## 2020-01-03 DIAGNOSIS — I4891 Unspecified atrial fibrillation: Secondary | ICD-10-CM | POA: Diagnosis not present

## 2020-01-03 DIAGNOSIS — F0151 Vascular dementia with behavioral disturbance: Secondary | ICD-10-CM | POA: Diagnosis not present

## 2020-01-03 DIAGNOSIS — R296 Repeated falls: Secondary | ICD-10-CM | POA: Diagnosis not present

## 2020-01-03 DIAGNOSIS — I959 Hypotension, unspecified: Secondary | ICD-10-CM | POA: Diagnosis not present

## 2020-01-03 DIAGNOSIS — M810 Age-related osteoporosis without current pathological fracture: Secondary | ICD-10-CM | POA: Diagnosis not present

## 2020-01-04 DIAGNOSIS — E559 Vitamin D deficiency, unspecified: Secondary | ICD-10-CM | POA: Diagnosis not present

## 2020-01-04 DIAGNOSIS — M810 Age-related osteoporosis without current pathological fracture: Secondary | ICD-10-CM | POA: Diagnosis not present

## 2020-01-04 DIAGNOSIS — I959 Hypotension, unspecified: Secondary | ICD-10-CM | POA: Diagnosis not present

## 2020-01-04 DIAGNOSIS — R296 Repeated falls: Secondary | ICD-10-CM | POA: Diagnosis not present

## 2020-01-04 DIAGNOSIS — F0151 Vascular dementia with behavioral disturbance: Secondary | ICD-10-CM | POA: Diagnosis not present

## 2020-01-04 DIAGNOSIS — I4891 Unspecified atrial fibrillation: Secondary | ICD-10-CM | POA: Diagnosis not present

## 2020-01-08 DIAGNOSIS — E559 Vitamin D deficiency, unspecified: Secondary | ICD-10-CM | POA: Diagnosis not present

## 2020-01-08 DIAGNOSIS — R296 Repeated falls: Secondary | ICD-10-CM | POA: Diagnosis not present

## 2020-01-08 DIAGNOSIS — I959 Hypotension, unspecified: Secondary | ICD-10-CM | POA: Diagnosis not present

## 2020-01-08 DIAGNOSIS — F0151 Vascular dementia with behavioral disturbance: Secondary | ICD-10-CM | POA: Diagnosis not present

## 2020-01-08 DIAGNOSIS — I4891 Unspecified atrial fibrillation: Secondary | ICD-10-CM | POA: Diagnosis not present

## 2020-01-08 DIAGNOSIS — M810 Age-related osteoporosis without current pathological fracture: Secondary | ICD-10-CM | POA: Diagnosis not present

## 2020-01-10 DIAGNOSIS — I4891 Unspecified atrial fibrillation: Secondary | ICD-10-CM | POA: Diagnosis not present

## 2020-01-10 DIAGNOSIS — F0151 Vascular dementia with behavioral disturbance: Secondary | ICD-10-CM | POA: Diagnosis not present

## 2020-01-10 DIAGNOSIS — M810 Age-related osteoporosis without current pathological fracture: Secondary | ICD-10-CM | POA: Diagnosis not present

## 2020-01-10 DIAGNOSIS — E559 Vitamin D deficiency, unspecified: Secondary | ICD-10-CM | POA: Diagnosis not present

## 2020-01-10 DIAGNOSIS — R296 Repeated falls: Secondary | ICD-10-CM | POA: Diagnosis not present

## 2020-01-10 DIAGNOSIS — I959 Hypotension, unspecified: Secondary | ICD-10-CM | POA: Diagnosis not present

## 2020-01-11 DIAGNOSIS — E559 Vitamin D deficiency, unspecified: Secondary | ICD-10-CM | POA: Diagnosis not present

## 2020-01-11 DIAGNOSIS — R296 Repeated falls: Secondary | ICD-10-CM | POA: Diagnosis not present

## 2020-01-11 DIAGNOSIS — M810 Age-related osteoporosis without current pathological fracture: Secondary | ICD-10-CM | POA: Diagnosis not present

## 2020-01-11 DIAGNOSIS — I4891 Unspecified atrial fibrillation: Secondary | ICD-10-CM | POA: Diagnosis not present

## 2020-01-11 DIAGNOSIS — I959 Hypotension, unspecified: Secondary | ICD-10-CM | POA: Diagnosis not present

## 2020-01-11 DIAGNOSIS — F0151 Vascular dementia with behavioral disturbance: Secondary | ICD-10-CM | POA: Diagnosis not present

## 2020-01-12 DIAGNOSIS — M542 Cervicalgia: Secondary | ICD-10-CM | POA: Diagnosis not present

## 2020-01-12 DIAGNOSIS — M810 Age-related osteoporosis without current pathological fracture: Secondary | ICD-10-CM | POA: Diagnosis not present

## 2020-01-12 DIAGNOSIS — F411 Generalized anxiety disorder: Secondary | ICD-10-CM | POA: Diagnosis not present

## 2020-01-12 DIAGNOSIS — I509 Heart failure, unspecified: Secondary | ICD-10-CM | POA: Diagnosis not present

## 2020-01-12 DIAGNOSIS — I959 Hypotension, unspecified: Secondary | ICD-10-CM | POA: Diagnosis not present

## 2020-01-12 DIAGNOSIS — R531 Weakness: Secondary | ICD-10-CM | POA: Diagnosis not present

## 2020-01-12 DIAGNOSIS — I4891 Unspecified atrial fibrillation: Secondary | ICD-10-CM | POA: Diagnosis not present

## 2020-01-12 DIAGNOSIS — F0151 Vascular dementia with behavioral disturbance: Secondary | ICD-10-CM | POA: Diagnosis not present

## 2020-01-12 DIAGNOSIS — R296 Repeated falls: Secondary | ICD-10-CM | POA: Diagnosis not present

## 2020-01-12 DIAGNOSIS — E559 Vitamin D deficiency, unspecified: Secondary | ICD-10-CM | POA: Diagnosis not present

## 2020-01-12 DIAGNOSIS — Z7901 Long term (current) use of anticoagulants: Secondary | ICD-10-CM | POA: Diagnosis not present

## 2020-01-15 DIAGNOSIS — F0151 Vascular dementia with behavioral disturbance: Secondary | ICD-10-CM | POA: Diagnosis not present

## 2020-01-15 DIAGNOSIS — E559 Vitamin D deficiency, unspecified: Secondary | ICD-10-CM | POA: Diagnosis not present

## 2020-01-15 DIAGNOSIS — I4891 Unspecified atrial fibrillation: Secondary | ICD-10-CM | POA: Diagnosis not present

## 2020-01-15 DIAGNOSIS — M810 Age-related osteoporosis without current pathological fracture: Secondary | ICD-10-CM | POA: Diagnosis not present

## 2020-01-15 DIAGNOSIS — I959 Hypotension, unspecified: Secondary | ICD-10-CM | POA: Diagnosis not present

## 2020-01-15 DIAGNOSIS — R296 Repeated falls: Secondary | ICD-10-CM | POA: Diagnosis not present

## 2020-01-16 DIAGNOSIS — I4891 Unspecified atrial fibrillation: Secondary | ICD-10-CM | POA: Diagnosis not present

## 2020-01-16 DIAGNOSIS — I959 Hypotension, unspecified: Secondary | ICD-10-CM | POA: Diagnosis not present

## 2020-01-16 DIAGNOSIS — F0151 Vascular dementia with behavioral disturbance: Secondary | ICD-10-CM | POA: Diagnosis not present

## 2020-01-16 DIAGNOSIS — E559 Vitamin D deficiency, unspecified: Secondary | ICD-10-CM | POA: Diagnosis not present

## 2020-01-16 DIAGNOSIS — M810 Age-related osteoporosis without current pathological fracture: Secondary | ICD-10-CM | POA: Diagnosis not present

## 2020-01-16 DIAGNOSIS — R296 Repeated falls: Secondary | ICD-10-CM | POA: Diagnosis not present

## 2020-01-18 DIAGNOSIS — M810 Age-related osteoporosis without current pathological fracture: Secondary | ICD-10-CM | POA: Diagnosis not present

## 2020-01-18 DIAGNOSIS — I959 Hypotension, unspecified: Secondary | ICD-10-CM | POA: Diagnosis not present

## 2020-01-18 DIAGNOSIS — I4891 Unspecified atrial fibrillation: Secondary | ICD-10-CM | POA: Diagnosis not present

## 2020-01-18 DIAGNOSIS — E559 Vitamin D deficiency, unspecified: Secondary | ICD-10-CM | POA: Diagnosis not present

## 2020-01-18 DIAGNOSIS — R296 Repeated falls: Secondary | ICD-10-CM | POA: Diagnosis not present

## 2020-01-18 DIAGNOSIS — F0151 Vascular dementia with behavioral disturbance: Secondary | ICD-10-CM | POA: Diagnosis not present

## 2020-01-23 DIAGNOSIS — E559 Vitamin D deficiency, unspecified: Secondary | ICD-10-CM | POA: Diagnosis not present

## 2020-01-23 DIAGNOSIS — M810 Age-related osteoporosis without current pathological fracture: Secondary | ICD-10-CM | POA: Diagnosis not present

## 2020-01-23 DIAGNOSIS — F0151 Vascular dementia with behavioral disturbance: Secondary | ICD-10-CM | POA: Diagnosis not present

## 2020-01-23 DIAGNOSIS — I959 Hypotension, unspecified: Secondary | ICD-10-CM | POA: Diagnosis not present

## 2020-01-23 DIAGNOSIS — R296 Repeated falls: Secondary | ICD-10-CM | POA: Diagnosis not present

## 2020-01-23 DIAGNOSIS — I4891 Unspecified atrial fibrillation: Secondary | ICD-10-CM | POA: Diagnosis not present

## 2020-01-25 DIAGNOSIS — F0151 Vascular dementia with behavioral disturbance: Secondary | ICD-10-CM | POA: Diagnosis not present

## 2020-01-25 DIAGNOSIS — E559 Vitamin D deficiency, unspecified: Secondary | ICD-10-CM | POA: Diagnosis not present

## 2020-01-25 DIAGNOSIS — I959 Hypotension, unspecified: Secondary | ICD-10-CM | POA: Diagnosis not present

## 2020-01-25 DIAGNOSIS — I4891 Unspecified atrial fibrillation: Secondary | ICD-10-CM | POA: Diagnosis not present

## 2020-01-25 DIAGNOSIS — R296 Repeated falls: Secondary | ICD-10-CM | POA: Diagnosis not present

## 2020-01-25 DIAGNOSIS — M810 Age-related osteoporosis without current pathological fracture: Secondary | ICD-10-CM | POA: Diagnosis not present

## 2020-01-26 DIAGNOSIS — I959 Hypotension, unspecified: Secondary | ICD-10-CM | POA: Diagnosis not present

## 2020-01-26 DIAGNOSIS — R296 Repeated falls: Secondary | ICD-10-CM | POA: Diagnosis not present

## 2020-01-26 DIAGNOSIS — M810 Age-related osteoporosis without current pathological fracture: Secondary | ICD-10-CM | POA: Diagnosis not present

## 2020-01-26 DIAGNOSIS — F0151 Vascular dementia with behavioral disturbance: Secondary | ICD-10-CM | POA: Diagnosis not present

## 2020-01-26 DIAGNOSIS — E559 Vitamin D deficiency, unspecified: Secondary | ICD-10-CM | POA: Diagnosis not present

## 2020-01-26 DIAGNOSIS — I4891 Unspecified atrial fibrillation: Secondary | ICD-10-CM | POA: Diagnosis not present

## 2020-01-30 DIAGNOSIS — M810 Age-related osteoporosis without current pathological fracture: Secondary | ICD-10-CM | POA: Diagnosis not present

## 2020-01-30 DIAGNOSIS — I959 Hypotension, unspecified: Secondary | ICD-10-CM | POA: Diagnosis not present

## 2020-01-30 DIAGNOSIS — I4891 Unspecified atrial fibrillation: Secondary | ICD-10-CM | POA: Diagnosis not present

## 2020-01-30 DIAGNOSIS — R296 Repeated falls: Secondary | ICD-10-CM | POA: Diagnosis not present

## 2020-01-30 DIAGNOSIS — E559 Vitamin D deficiency, unspecified: Secondary | ICD-10-CM | POA: Diagnosis not present

## 2020-01-30 DIAGNOSIS — F0151 Vascular dementia with behavioral disturbance: Secondary | ICD-10-CM | POA: Diagnosis not present

## 2020-01-31 DIAGNOSIS — R531 Weakness: Secondary | ICD-10-CM | POA: Diagnosis not present

## 2020-01-31 DIAGNOSIS — M545 Low back pain: Secondary | ICD-10-CM | POA: Diagnosis not present

## 2020-01-31 DIAGNOSIS — E1122 Type 2 diabetes mellitus with diabetic chronic kidney disease: Secondary | ICD-10-CM | POA: Diagnosis not present

## 2020-01-31 DIAGNOSIS — I130 Hypertensive heart and chronic kidney disease with heart failure and stage 1 through stage 4 chronic kidney disease, or unspecified chronic kidney disease: Secondary | ICD-10-CM | POA: Diagnosis not present

## 2020-01-31 DIAGNOSIS — E782 Mixed hyperlipidemia: Secondary | ICD-10-CM | POA: Diagnosis not present

## 2020-01-31 DIAGNOSIS — E1159 Type 2 diabetes mellitus with other circulatory complications: Secondary | ICD-10-CM | POA: Diagnosis not present

## 2020-01-31 DIAGNOSIS — F0390 Unspecified dementia without behavioral disturbance: Secondary | ICD-10-CM | POA: Diagnosis not present

## 2020-01-31 DIAGNOSIS — L89312 Pressure ulcer of right buttock, stage 2: Secondary | ICD-10-CM | POA: Diagnosis not present

## 2020-01-31 DIAGNOSIS — N390 Urinary tract infection, site not specified: Secondary | ICD-10-CM | POA: Diagnosis not present

## 2020-01-31 DIAGNOSIS — H3530 Unspecified macular degeneration: Secondary | ICD-10-CM | POA: Diagnosis not present

## 2020-01-31 DIAGNOSIS — R2689 Other abnormalities of gait and mobility: Secondary | ICD-10-CM | POA: Diagnosis not present

## 2020-01-31 DIAGNOSIS — R945 Abnormal results of liver function studies: Secondary | ICD-10-CM | POA: Diagnosis not present

## 2020-01-31 DIAGNOSIS — G894 Chronic pain syndrome: Secondary | ICD-10-CM | POA: Diagnosis not present

## 2020-01-31 DIAGNOSIS — M791 Myalgia, unspecified site: Secondary | ICD-10-CM | POA: Diagnosis not present

## 2020-01-31 DIAGNOSIS — F411 Generalized anxiety disorder: Secondary | ICD-10-CM | POA: Diagnosis not present

## 2020-01-31 DIAGNOSIS — L89322 Pressure ulcer of left buttock, stage 2: Secondary | ICD-10-CM | POA: Diagnosis not present

## 2020-01-31 DIAGNOSIS — G8929 Other chronic pain: Secondary | ICD-10-CM | POA: Diagnosis not present

## 2020-01-31 DIAGNOSIS — B9620 Unspecified Escherichia coli [E. coli] as the cause of diseases classified elsewhere: Secondary | ICD-10-CM | POA: Diagnosis not present

## 2020-01-31 DIAGNOSIS — N1831 Chronic kidney disease, stage 3a: Secondary | ICD-10-CM | POA: Diagnosis not present

## 2020-01-31 DIAGNOSIS — I5032 Chronic diastolic (congestive) heart failure: Secondary | ICD-10-CM | POA: Diagnosis not present

## 2020-01-31 DIAGNOSIS — N183 Chronic kidney disease, stage 3 unspecified: Secondary | ICD-10-CM | POA: Diagnosis not present

## 2020-01-31 DIAGNOSIS — I10 Essential (primary) hypertension: Secondary | ICD-10-CM | POA: Diagnosis not present

## 2020-02-01 DIAGNOSIS — R296 Repeated falls: Secondary | ICD-10-CM | POA: Diagnosis not present

## 2020-02-01 DIAGNOSIS — F0151 Vascular dementia with behavioral disturbance: Secondary | ICD-10-CM | POA: Diagnosis not present

## 2020-02-01 DIAGNOSIS — I959 Hypotension, unspecified: Secondary | ICD-10-CM | POA: Diagnosis not present

## 2020-02-01 DIAGNOSIS — I4891 Unspecified atrial fibrillation: Secondary | ICD-10-CM | POA: Diagnosis not present

## 2020-02-01 DIAGNOSIS — M810 Age-related osteoporosis without current pathological fracture: Secondary | ICD-10-CM | POA: Diagnosis not present

## 2020-02-01 DIAGNOSIS — E559 Vitamin D deficiency, unspecified: Secondary | ICD-10-CM | POA: Diagnosis not present

## 2020-02-05 DIAGNOSIS — R296 Repeated falls: Secondary | ICD-10-CM | POA: Diagnosis not present

## 2020-02-05 DIAGNOSIS — F0151 Vascular dementia with behavioral disturbance: Secondary | ICD-10-CM | POA: Diagnosis not present

## 2020-02-05 DIAGNOSIS — I959 Hypotension, unspecified: Secondary | ICD-10-CM | POA: Diagnosis not present

## 2020-02-05 DIAGNOSIS — I4891 Unspecified atrial fibrillation: Secondary | ICD-10-CM | POA: Diagnosis not present

## 2020-02-05 DIAGNOSIS — E559 Vitamin D deficiency, unspecified: Secondary | ICD-10-CM | POA: Diagnosis not present

## 2020-02-05 DIAGNOSIS — M810 Age-related osteoporosis without current pathological fracture: Secondary | ICD-10-CM | POA: Diagnosis not present

## 2020-02-06 DIAGNOSIS — R0789 Other chest pain: Secondary | ICD-10-CM | POA: Diagnosis not present

## 2020-02-06 DIAGNOSIS — W19XXXA Unspecified fall, initial encounter: Secondary | ICD-10-CM | POA: Diagnosis not present

## 2020-02-08 DIAGNOSIS — R350 Frequency of micturition: Secondary | ICD-10-CM | POA: Diagnosis not present

## 2020-02-08 DIAGNOSIS — I10 Essential (primary) hypertension: Secondary | ICD-10-CM | POA: Diagnosis not present

## 2020-02-08 DIAGNOSIS — Z299 Encounter for prophylactic measures, unspecified: Secondary | ICD-10-CM | POA: Diagnosis not present

## 2020-02-08 DIAGNOSIS — I4891 Unspecified atrial fibrillation: Secondary | ICD-10-CM | POA: Diagnosis not present

## 2020-02-08 DIAGNOSIS — I959 Hypotension, unspecified: Secondary | ICD-10-CM | POA: Diagnosis not present

## 2020-02-08 DIAGNOSIS — M810 Age-related osteoporosis without current pathological fracture: Secondary | ICD-10-CM | POA: Diagnosis not present

## 2020-02-08 DIAGNOSIS — F0151 Vascular dementia with behavioral disturbance: Secondary | ICD-10-CM | POA: Diagnosis not present

## 2020-02-08 DIAGNOSIS — F0390 Unspecified dementia without behavioral disturbance: Secondary | ICD-10-CM | POA: Diagnosis not present

## 2020-02-08 DIAGNOSIS — R296 Repeated falls: Secondary | ICD-10-CM | POA: Diagnosis not present

## 2020-02-08 DIAGNOSIS — E559 Vitamin D deficiency, unspecified: Secondary | ICD-10-CM | POA: Diagnosis not present

## 2020-02-08 DIAGNOSIS — R42 Dizziness and giddiness: Secondary | ICD-10-CM | POA: Diagnosis not present

## 2020-02-13 DIAGNOSIS — M25569 Pain in unspecified knee: Secondary | ICD-10-CM | POA: Diagnosis not present

## 2020-02-13 DIAGNOSIS — M6281 Muscle weakness (generalized): Secondary | ICD-10-CM | POA: Diagnosis not present

## 2020-02-13 DIAGNOSIS — M545 Low back pain: Secondary | ICD-10-CM | POA: Diagnosis not present

## 2020-02-13 DIAGNOSIS — R296 Repeated falls: Secondary | ICD-10-CM | POA: Diagnosis not present

## 2020-02-14 DIAGNOSIS — L84 Corns and callosities: Secondary | ICD-10-CM | POA: Diagnosis not present

## 2020-02-14 DIAGNOSIS — E1151 Type 2 diabetes mellitus with diabetic peripheral angiopathy without gangrene: Secondary | ICD-10-CM | POA: Diagnosis not present

## 2020-02-15 DIAGNOSIS — M25569 Pain in unspecified knee: Secondary | ICD-10-CM | POA: Diagnosis not present

## 2020-02-15 DIAGNOSIS — M6281 Muscle weakness (generalized): Secondary | ICD-10-CM | POA: Diagnosis not present

## 2020-02-15 DIAGNOSIS — M545 Low back pain: Secondary | ICD-10-CM | POA: Diagnosis not present

## 2020-02-15 DIAGNOSIS — R296 Repeated falls: Secondary | ICD-10-CM | POA: Diagnosis not present

## 2020-02-20 DIAGNOSIS — M5459 Other low back pain: Secondary | ICD-10-CM | POA: Diagnosis not present

## 2020-02-20 DIAGNOSIS — M25569 Pain in unspecified knee: Secondary | ICD-10-CM | POA: Diagnosis not present

## 2020-02-20 DIAGNOSIS — R296 Repeated falls: Secondary | ICD-10-CM | POA: Diagnosis not present

## 2020-02-20 DIAGNOSIS — M6281 Muscle weakness (generalized): Secondary | ICD-10-CM | POA: Diagnosis not present

## 2020-02-22 DIAGNOSIS — Z299 Encounter for prophylactic measures, unspecified: Secondary | ICD-10-CM | POA: Diagnosis not present

## 2020-02-22 DIAGNOSIS — I509 Heart failure, unspecified: Secondary | ICD-10-CM | POA: Diagnosis not present

## 2020-02-22 DIAGNOSIS — M25569 Pain in unspecified knee: Secondary | ICD-10-CM | POA: Diagnosis not present

## 2020-02-22 DIAGNOSIS — M5459 Other low back pain: Secondary | ICD-10-CM | POA: Diagnosis not present

## 2020-02-22 DIAGNOSIS — M6281 Muscle weakness (generalized): Secondary | ICD-10-CM | POA: Diagnosis not present

## 2020-02-22 DIAGNOSIS — I4891 Unspecified atrial fibrillation: Secondary | ICD-10-CM | POA: Diagnosis not present

## 2020-02-22 DIAGNOSIS — R296 Repeated falls: Secondary | ICD-10-CM | POA: Diagnosis not present

## 2020-02-22 DIAGNOSIS — R634 Abnormal weight loss: Secondary | ICD-10-CM | POA: Diagnosis not present

## 2020-02-22 DIAGNOSIS — I10 Essential (primary) hypertension: Secondary | ICD-10-CM | POA: Diagnosis not present

## 2020-02-26 DIAGNOSIS — M25569 Pain in unspecified knee: Secondary | ICD-10-CM | POA: Diagnosis not present

## 2020-02-26 DIAGNOSIS — M6281 Muscle weakness (generalized): Secondary | ICD-10-CM | POA: Diagnosis not present

## 2020-02-26 DIAGNOSIS — M5459 Other low back pain: Secondary | ICD-10-CM | POA: Diagnosis not present

## 2020-02-26 DIAGNOSIS — R296 Repeated falls: Secondary | ICD-10-CM | POA: Diagnosis not present

## 2020-02-28 DIAGNOSIS — R296 Repeated falls: Secondary | ICD-10-CM | POA: Diagnosis not present

## 2020-02-28 DIAGNOSIS — M5459 Other low back pain: Secondary | ICD-10-CM | POA: Diagnosis not present

## 2020-02-28 DIAGNOSIS — M25569 Pain in unspecified knee: Secondary | ICD-10-CM | POA: Diagnosis not present

## 2020-02-28 DIAGNOSIS — M6281 Muscle weakness (generalized): Secondary | ICD-10-CM | POA: Diagnosis not present

## 2020-03-05 DIAGNOSIS — M5459 Other low back pain: Secondary | ICD-10-CM | POA: Diagnosis not present

## 2020-03-05 DIAGNOSIS — R296 Repeated falls: Secondary | ICD-10-CM | POA: Diagnosis not present

## 2020-03-05 DIAGNOSIS — M6281 Muscle weakness (generalized): Secondary | ICD-10-CM | POA: Diagnosis not present

## 2020-03-05 DIAGNOSIS — M25569 Pain in unspecified knee: Secondary | ICD-10-CM | POA: Diagnosis not present

## 2020-03-07 DIAGNOSIS — M6281 Muscle weakness (generalized): Secondary | ICD-10-CM | POA: Diagnosis not present

## 2020-03-07 DIAGNOSIS — M5459 Other low back pain: Secondary | ICD-10-CM | POA: Diagnosis not present

## 2020-03-07 DIAGNOSIS — I10 Essential (primary) hypertension: Secondary | ICD-10-CM | POA: Diagnosis not present

## 2020-03-07 DIAGNOSIS — I959 Hypotension, unspecified: Secondary | ICD-10-CM | POA: Diagnosis not present

## 2020-03-07 DIAGNOSIS — L8992 Pressure ulcer of unspecified site, stage 2: Secondary | ICD-10-CM | POA: Diagnosis not present

## 2020-03-07 DIAGNOSIS — Z299 Encounter for prophylactic measures, unspecified: Secondary | ICD-10-CM | POA: Diagnosis not present

## 2020-03-07 DIAGNOSIS — I4891 Unspecified atrial fibrillation: Secondary | ICD-10-CM | POA: Diagnosis not present

## 2020-03-07 DIAGNOSIS — M25569 Pain in unspecified knee: Secondary | ICD-10-CM | POA: Diagnosis not present

## 2020-03-07 DIAGNOSIS — R296 Repeated falls: Secondary | ICD-10-CM | POA: Diagnosis not present

## 2020-03-11 DIAGNOSIS — R296 Repeated falls: Secondary | ICD-10-CM | POA: Diagnosis not present

## 2020-03-11 DIAGNOSIS — M6281 Muscle weakness (generalized): Secondary | ICD-10-CM | POA: Diagnosis not present

## 2020-03-11 DIAGNOSIS — M5459 Other low back pain: Secondary | ICD-10-CM | POA: Diagnosis not present

## 2020-03-11 DIAGNOSIS — M25569 Pain in unspecified knee: Secondary | ICD-10-CM | POA: Diagnosis not present

## 2020-03-13 DIAGNOSIS — R296 Repeated falls: Secondary | ICD-10-CM | POA: Diagnosis not present

## 2020-03-13 DIAGNOSIS — M6281 Muscle weakness (generalized): Secondary | ICD-10-CM | POA: Diagnosis not present

## 2020-03-13 DIAGNOSIS — M5459 Other low back pain: Secondary | ICD-10-CM | POA: Diagnosis not present

## 2020-03-13 DIAGNOSIS — M25569 Pain in unspecified knee: Secondary | ICD-10-CM | POA: Diagnosis not present

## 2020-03-14 DIAGNOSIS — I4891 Unspecified atrial fibrillation: Secondary | ICD-10-CM | POA: Diagnosis not present

## 2020-03-14 DIAGNOSIS — N390 Urinary tract infection, site not specified: Secondary | ICD-10-CM | POA: Diagnosis not present

## 2020-03-14 DIAGNOSIS — I509 Heart failure, unspecified: Secondary | ICD-10-CM | POA: Diagnosis not present

## 2020-03-14 DIAGNOSIS — Z299 Encounter for prophylactic measures, unspecified: Secondary | ICD-10-CM | POA: Diagnosis not present

## 2020-03-14 DIAGNOSIS — L8992 Pressure ulcer of unspecified site, stage 2: Secondary | ICD-10-CM | POA: Diagnosis not present

## 2020-03-18 DIAGNOSIS — M5459 Other low back pain: Secondary | ICD-10-CM | POA: Diagnosis not present

## 2020-03-18 DIAGNOSIS — M25569 Pain in unspecified knee: Secondary | ICD-10-CM | POA: Diagnosis not present

## 2020-03-18 DIAGNOSIS — R296 Repeated falls: Secondary | ICD-10-CM | POA: Diagnosis not present

## 2020-03-18 DIAGNOSIS — M6281 Muscle weakness (generalized): Secondary | ICD-10-CM | POA: Diagnosis not present

## 2020-03-19 DIAGNOSIS — L89322 Pressure ulcer of left buttock, stage 2: Secondary | ICD-10-CM | POA: Diagnosis not present

## 2020-03-28 IMAGING — CT CT CHEST W/O CM
2 of 4 series · 15 of 36 positions shown, 18 images · non-contrast
Comparison: CT August 08, 2015.

CLINICAL DATA: Pleural effusion.

EXAM:
CT CHEST WITHOUT CONTRAST
TECHNIQUE: Multidetector CT imaging of the chest was performed following the
standard protocol without IV contrast.

[Series 2: routine chest without · axial · non-contrast · 0.60mm/px · z∈[-336,-90]mm · 12 of 147 slices shown, 15 images]
[im 12/147  mediastinal]
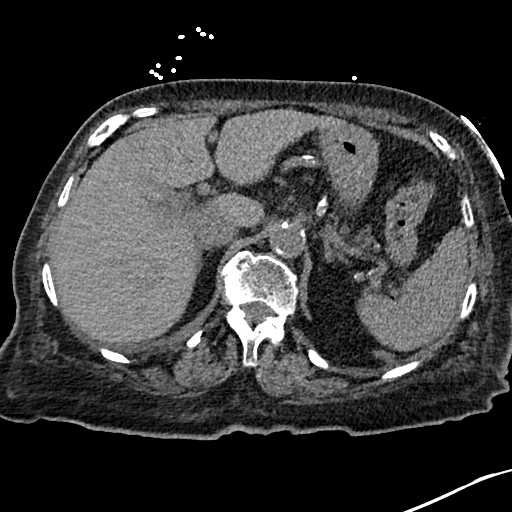
[im 12/147  lung]
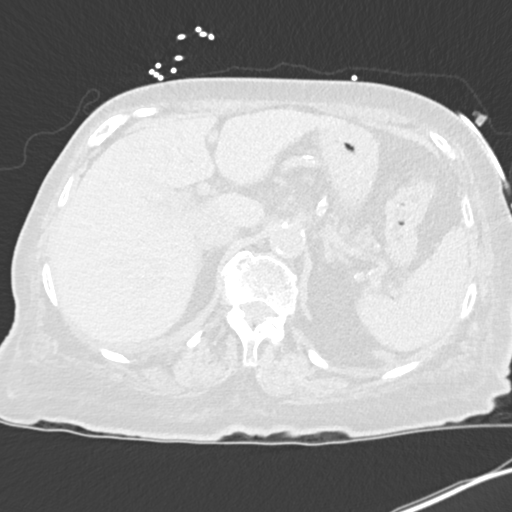
[im 23/147  lung]
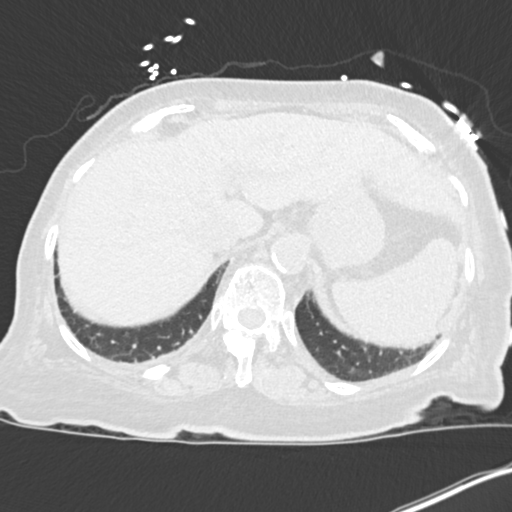
[im 34/147  lung]
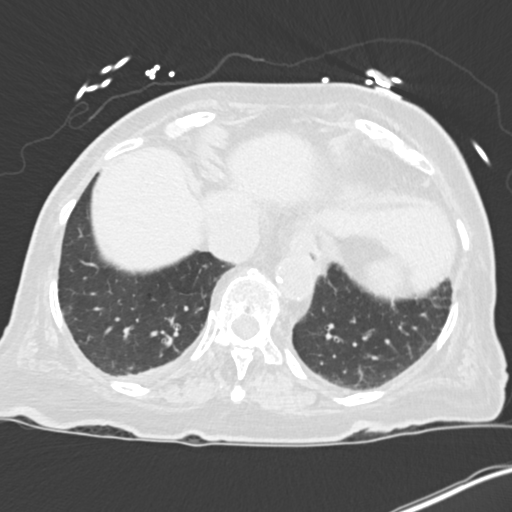
[im 45/147  lung]
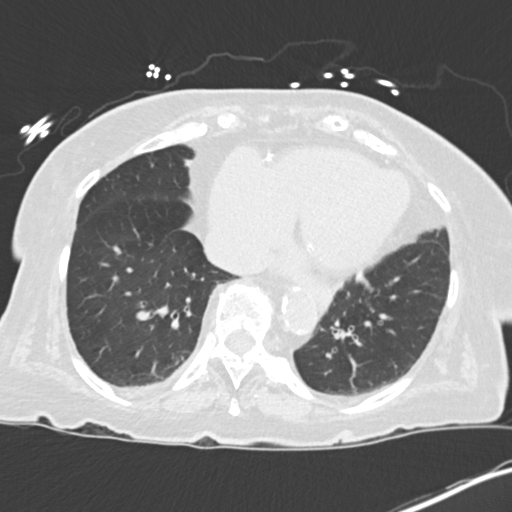
[im 57/147  mediastinal]
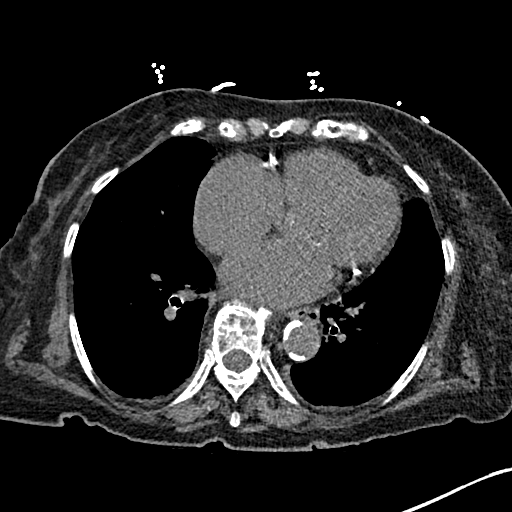
[im 57/147  lung]
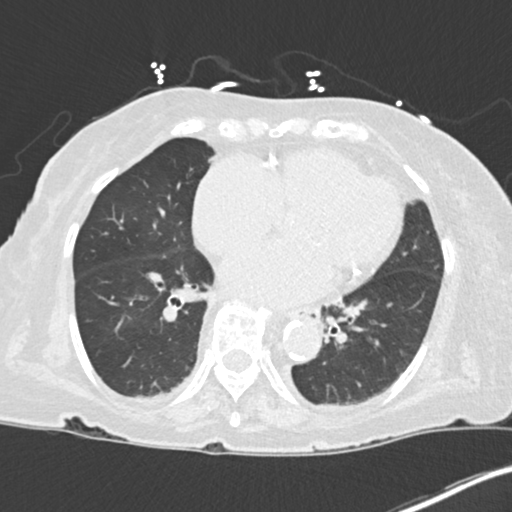
[im 68/147  lung]
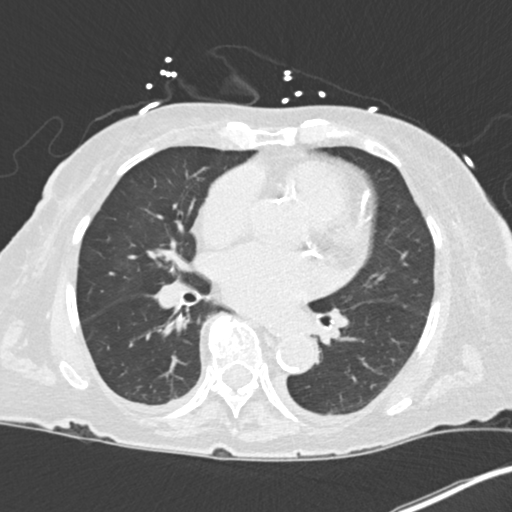
[im 79/147  lung]
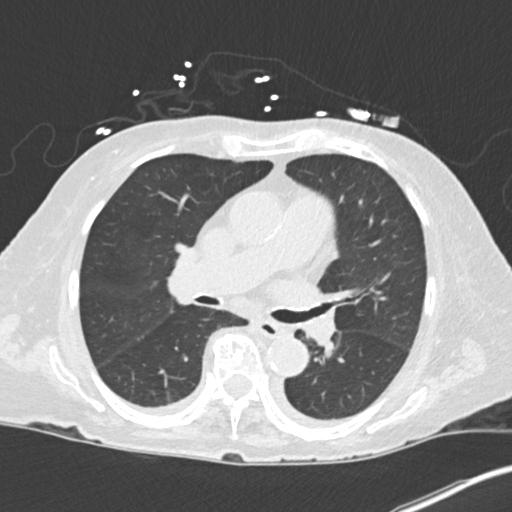
[im 90/147  lung]
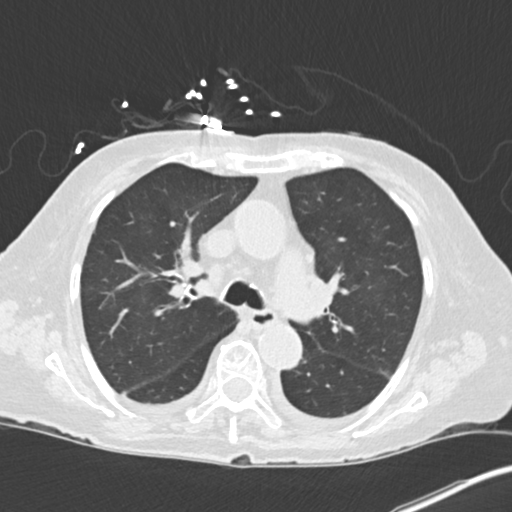
[im 102/147  mediastinal]
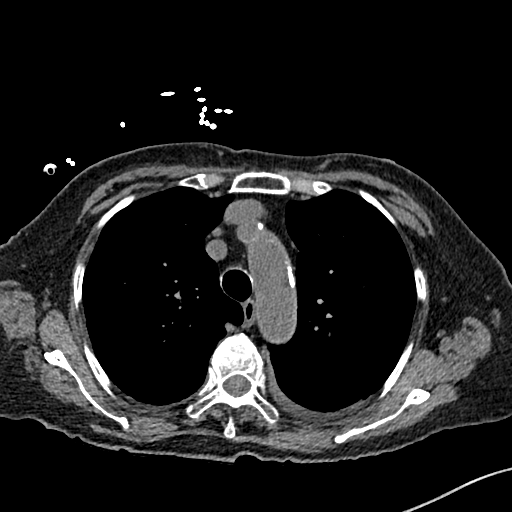
[im 102/147  lung]
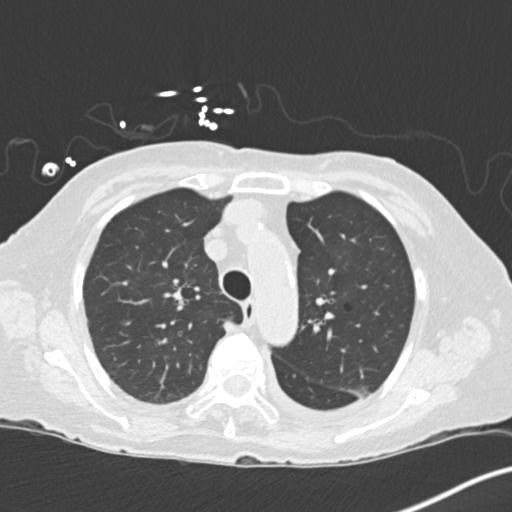
[im 113/147  lung]
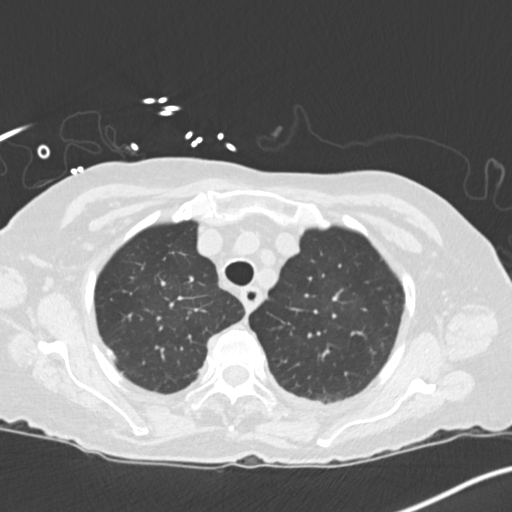
[im 124/147  lung]
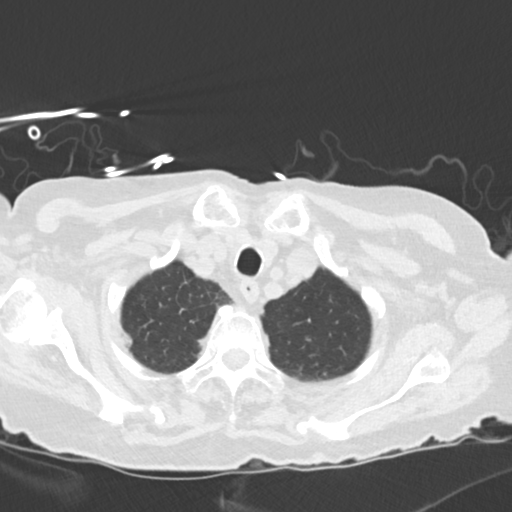
[im 135/147  lung]
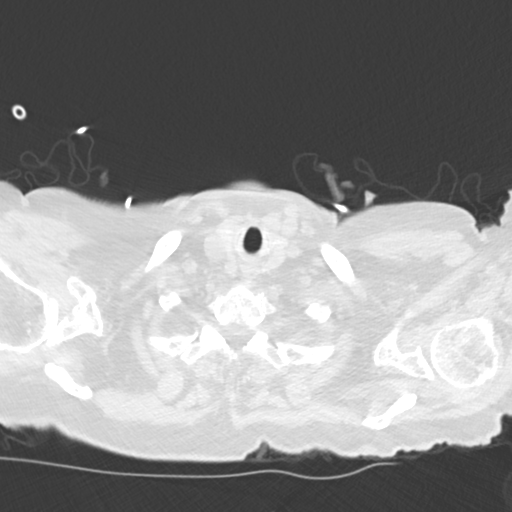

[Series 5: coronal · coronal · 0.62mm/px · 3 of 151 slices shown]
[im 31/151  lung]
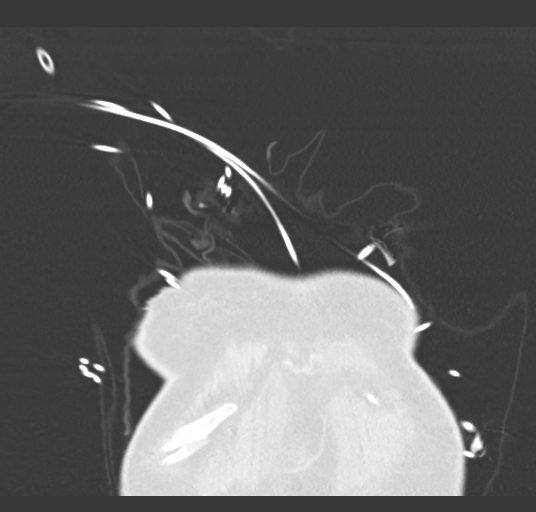
[im 61/151  lung]
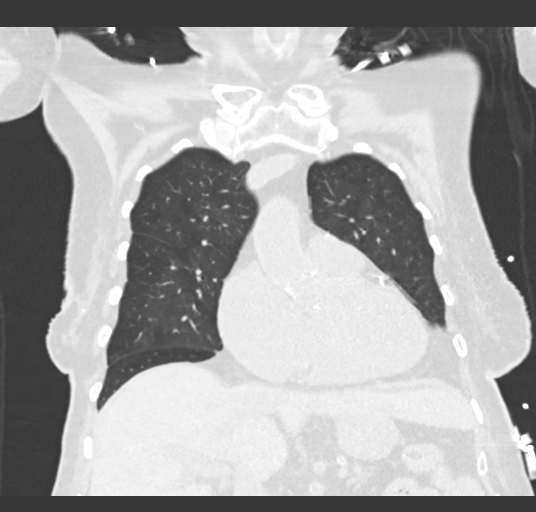
[im 91/151  lung]
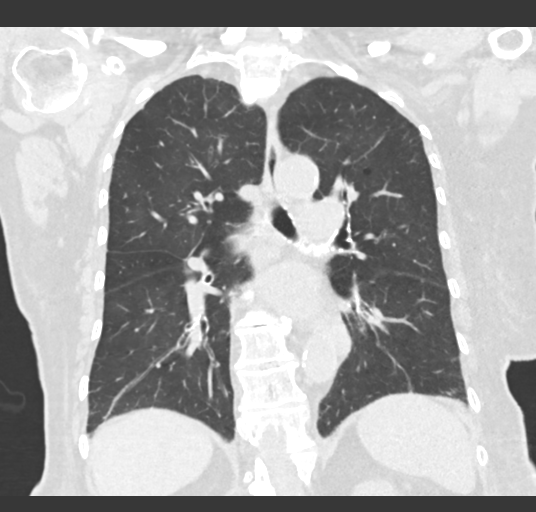

[15 of 36 positions shown; findings below may reference images not displayed]

FINDINGS: Cardiovascular: The heart size is enlarged with significant biatrial
enlargement. Coronary artery calcifications are noted. Thoracic
aortic calcifications are noted. There is no significant pericardial
effusion.

Mediastinum/Nodes:

--No mediastinal or hilar lymphadenopathy.

--No axillary lymphadenopathy.

--No supraclavicular lymphadenopathy.

--Normal thyroid gland.

--The esophagus is unremarkable

Lungs/Pleura: There is a stable 3 mm pulmonary nodule in the right
middle lobe (axial series 4, image 78). There are trace bilateral
pleural effusions. There is no pneumothorax.

Upper Abdomen: There is cholelithiasis involving the partially
visualized gallbladder.

Musculoskeletal: No chest wall abnormality. No acute or significant
osseous findings.

Review of the MIP images confirms the above findings.
IMPRESSION: 1. Trace bilateral pleural effusions.
2. No focal infiltrate.
3. There is cholelithiasis without secondary signs of acute
cholecystitis.

Aortic Atherosclerosis (ASSC7-BV4.4).

## 2020-03-28 IMAGING — CT CT HEAD W/O CM
3 series · 16 of 47 positions shown, 19 images · non-contrast
Comparison: 07/09/2018

CLINICAL DATA: Weakness.

EXAM:
CT HEAD WITHOUT CONTRAST
TECHNIQUE: Contiguous axial images were obtained from the base of the skull
through the vertex without intravenous contrast.

[Series 2: head w o · axial · 0.44mm/px · z∈[+40,+165]mm · 10 of 31 slices shown, 13 images]
[im 3/31  brain]
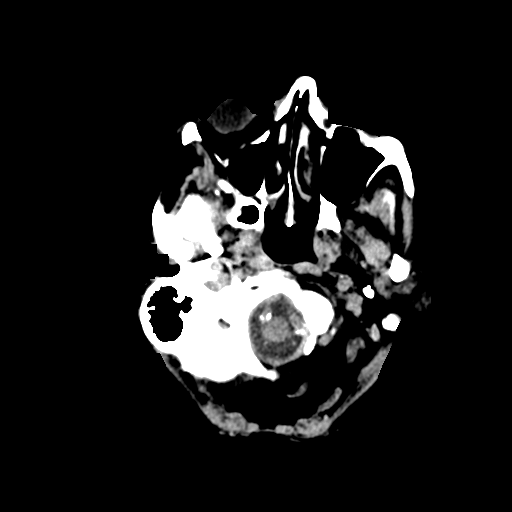
[im 3/31  bone]
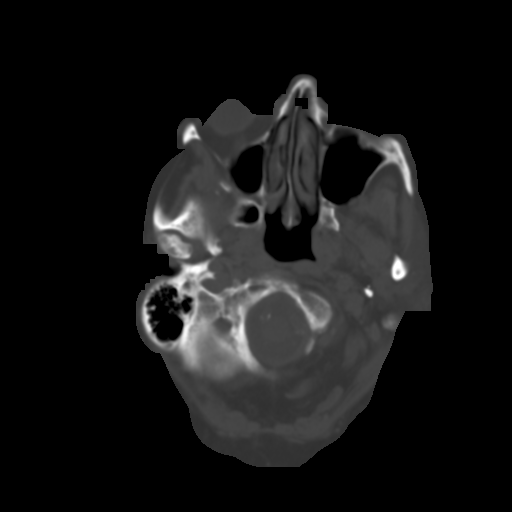
[im 6/31  brain]
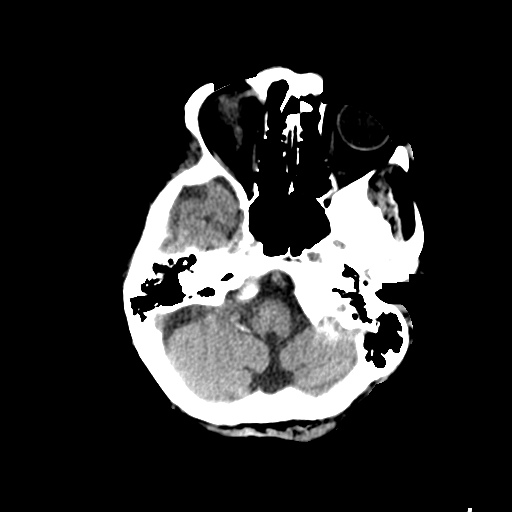
[im 9/31  brain]
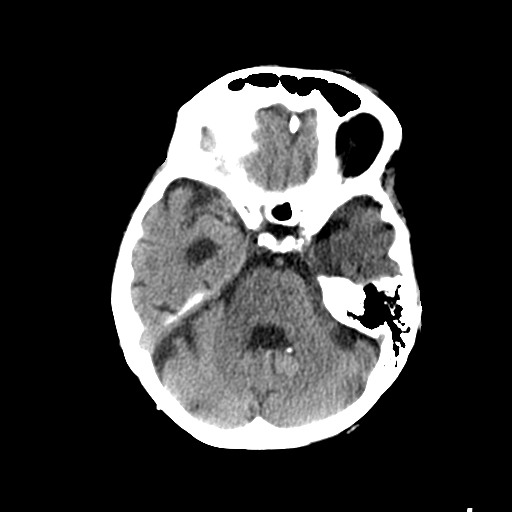
[im 11/31  brain]
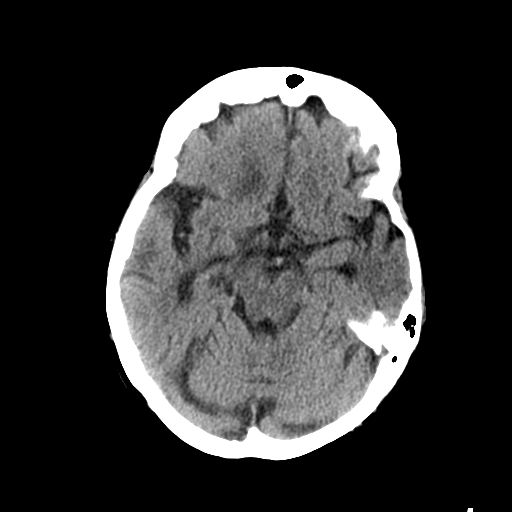
[im 14/31  brain]
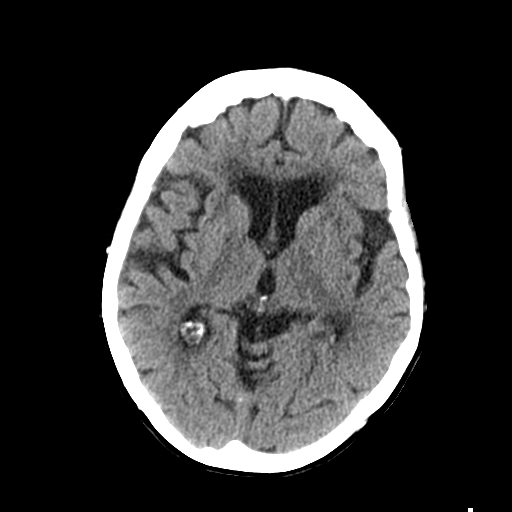
[im 14/31  bone]
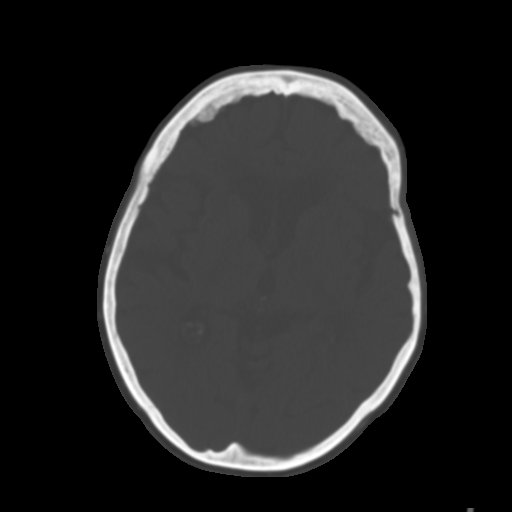
[im 17/31  brain]
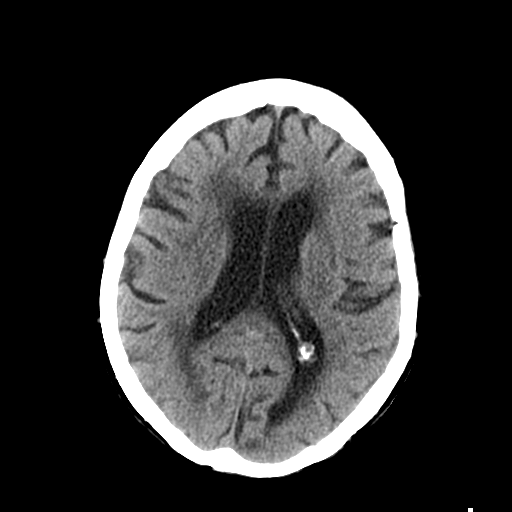
[im 20/31  brain]
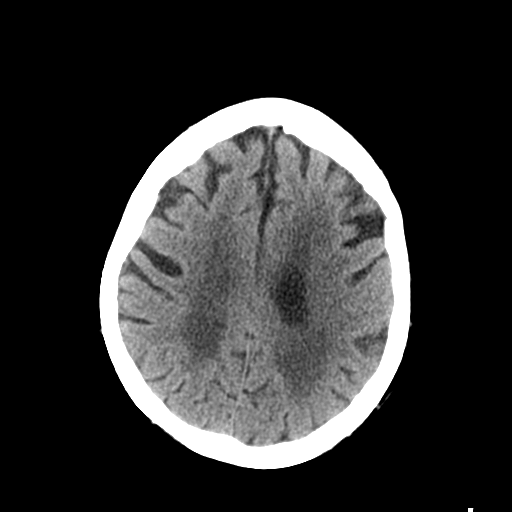
[im 23/31  brain]
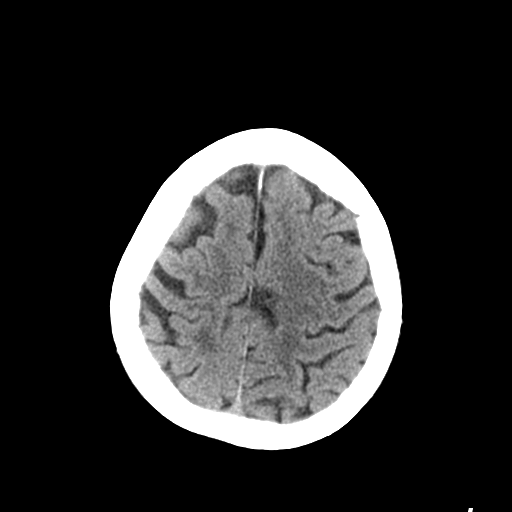
[im 25/31  brain]
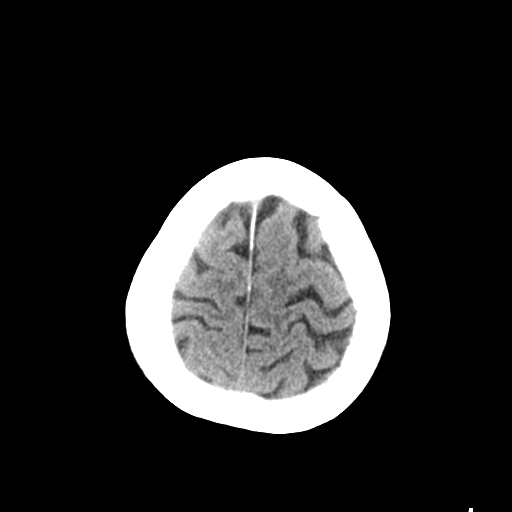
[im 25/31  bone]
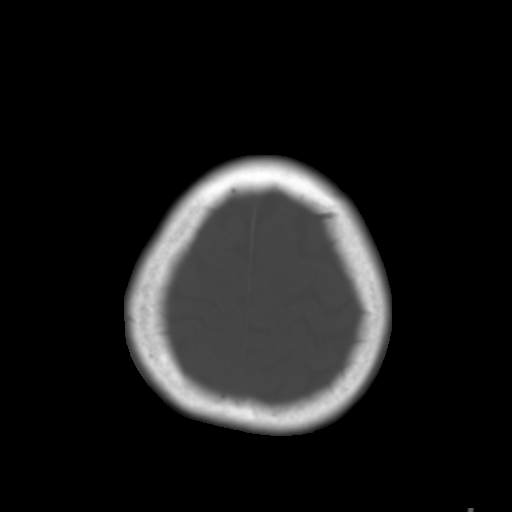
[im 28/31  brain]
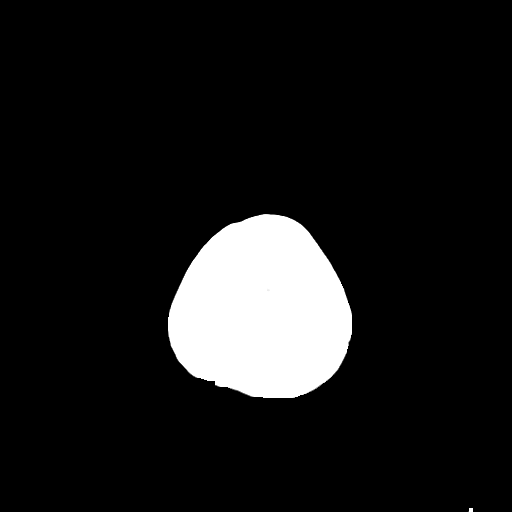

[Series 4: coronal soft · coronal · 0.34mm/px · 3 of 72 slices shown]
[im 24/72  brain]
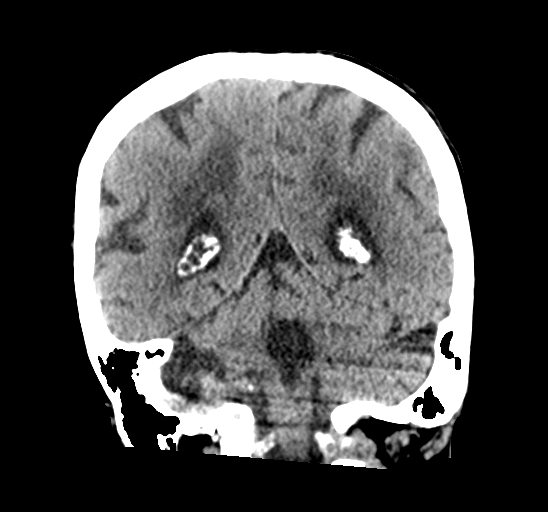
[im 32/72  brain]
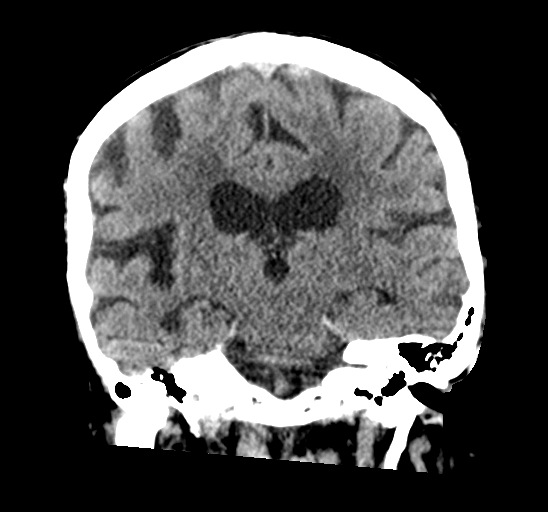
[im 40/72  brain]
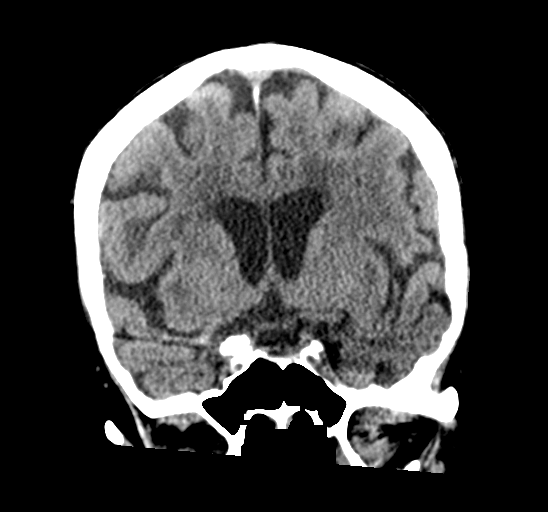

[Series 5: sagittal soft · sagittal · 0.32mm/px · 3 of 62 slices shown]
[im 21/62  brain]
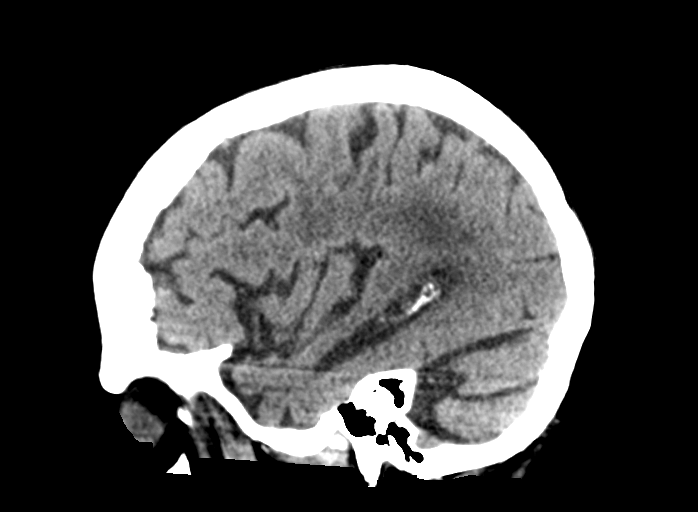
[im 31/62  brain]
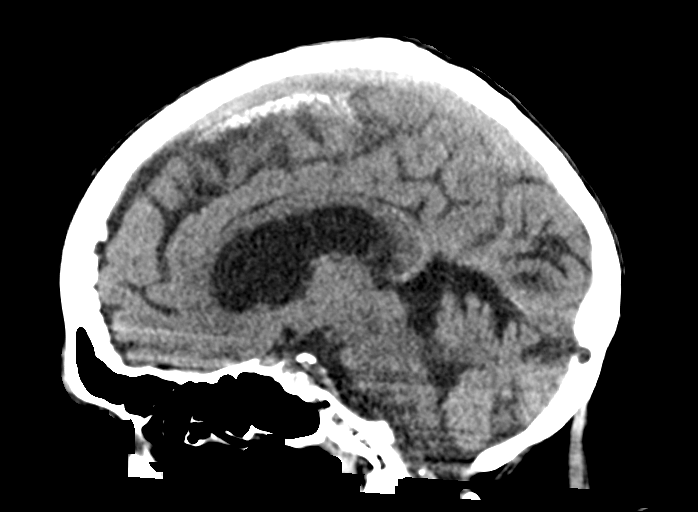
[im 41/62  brain]
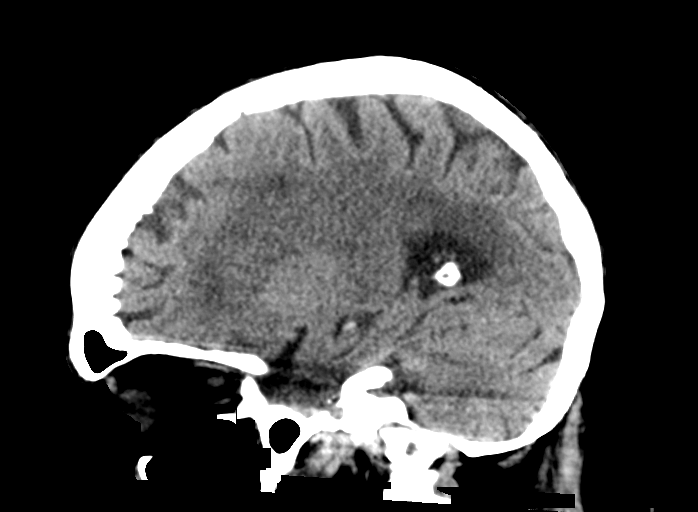

[16 of 47 positions shown; findings below may reference images not displayed]

FINDINGS: Brain: No evidence of acute infarction, hemorrhage, hydrocephalus,
extra-axial collection or mass lesion/mass effect. Again noted is
atrophy and advanced chronic microvascular ischemic changes.

Vascular: No hyperdense vessel or unexpected calcification.

Skull: Normal. Negative for fracture or focal lesion.

Sinuses/Orbits: No acute finding.

Other: None.
IMPRESSION: No acute intracranial abnormality.

## 2020-04-01 DIAGNOSIS — Z96641 Presence of right artificial hip joint: Secondary | ICD-10-CM | POA: Diagnosis not present

## 2020-04-01 DIAGNOSIS — F419 Anxiety disorder, unspecified: Secondary | ICD-10-CM | POA: Diagnosis not present

## 2020-04-01 DIAGNOSIS — I4891 Unspecified atrial fibrillation: Secondary | ICD-10-CM | POA: Diagnosis not present

## 2020-04-01 DIAGNOSIS — K219 Gastro-esophageal reflux disease without esophagitis: Secondary | ICD-10-CM | POA: Diagnosis not present

## 2020-04-01 DIAGNOSIS — W07XXXA Fall from chair, initial encounter: Secondary | ICD-10-CM | POA: Diagnosis not present

## 2020-04-01 DIAGNOSIS — I5032 Chronic diastolic (congestive) heart failure: Secondary | ICD-10-CM | POA: Diagnosis not present

## 2020-04-01 DIAGNOSIS — S0990XA Unspecified injury of head, initial encounter: Secondary | ICD-10-CM | POA: Diagnosis not present

## 2020-04-01 DIAGNOSIS — Z043 Encounter for examination and observation following other accident: Secondary | ICD-10-CM | POA: Diagnosis not present

## 2020-04-01 DIAGNOSIS — R29898 Other symptoms and signs involving the musculoskeletal system: Secondary | ICD-10-CM | POA: Diagnosis not present

## 2020-04-01 DIAGNOSIS — E119 Type 2 diabetes mellitus without complications: Secondary | ICD-10-CM | POA: Diagnosis not present

## 2020-04-01 DIAGNOSIS — Z593 Problems related to living in residential institution: Secondary | ICD-10-CM | POA: Diagnosis not present

## 2020-04-01 DIAGNOSIS — S0511XA Contusion of eyeball and orbital tissues, right eye, initial encounter: Secondary | ICD-10-CM | POA: Diagnosis not present

## 2020-04-01 DIAGNOSIS — E785 Hyperlipidemia, unspecified: Secondary | ICD-10-CM | POA: Diagnosis not present

## 2020-04-01 DIAGNOSIS — W19XXXA Unspecified fall, initial encounter: Secondary | ICD-10-CM | POA: Diagnosis not present

## 2020-04-01 DIAGNOSIS — Z79899 Other long term (current) drug therapy: Secondary | ICD-10-CM | POA: Diagnosis not present

## 2020-04-01 DIAGNOSIS — I110 Hypertensive heart disease with heart failure: Secondary | ICD-10-CM | POA: Diagnosis not present

## 2020-04-01 DIAGNOSIS — R609 Edema, unspecified: Secondary | ICD-10-CM | POA: Diagnosis not present

## 2020-04-01 DIAGNOSIS — S0083XA Contusion of other part of head, initial encounter: Secondary | ICD-10-CM | POA: Diagnosis not present

## 2020-04-02 DIAGNOSIS — W19XXXA Unspecified fall, initial encounter: Secondary | ICD-10-CM | POA: Diagnosis not present

## 2020-04-02 DIAGNOSIS — M25551 Pain in right hip: Secondary | ICD-10-CM | POA: Diagnosis not present

## 2020-04-03 DIAGNOSIS — Z299 Encounter for prophylactic measures, unspecified: Secondary | ICD-10-CM | POA: Diagnosis not present

## 2020-04-03 DIAGNOSIS — H05231 Hemorrhage of right orbit: Secondary | ICD-10-CM | POA: Diagnosis not present

## 2020-04-05 DIAGNOSIS — L89322 Pressure ulcer of left buttock, stage 2: Secondary | ICD-10-CM | POA: Diagnosis not present

## 2020-04-05 DIAGNOSIS — N390 Urinary tract infection, site not specified: Secondary | ICD-10-CM | POA: Diagnosis not present

## 2020-04-05 DIAGNOSIS — L89312 Pressure ulcer of right buttock, stage 2: Secondary | ICD-10-CM | POA: Diagnosis not present

## 2020-04-05 DIAGNOSIS — Z4800 Encounter for change or removal of nonsurgical wound dressing: Secondary | ICD-10-CM | POA: Diagnosis not present

## 2020-04-08 DIAGNOSIS — R519 Headache, unspecified: Secondary | ICD-10-CM | POA: Diagnosis not present

## 2020-04-18 DIAGNOSIS — L89322 Pressure ulcer of left buttock, stage 2: Secondary | ICD-10-CM | POA: Diagnosis not present

## 2020-04-30 DIAGNOSIS — L84 Corns and callosities: Secondary | ICD-10-CM | POA: Diagnosis not present

## 2020-04-30 DIAGNOSIS — E1151 Type 2 diabetes mellitus with diabetic peripheral angiopathy without gangrene: Secondary | ICD-10-CM | POA: Diagnosis not present

## 2020-05-06 NOTE — Progress Notes (Signed)
Cardiology Office Note  Date: 05/07/2020   ID: Amanda Hale, DOB Nov 16, 1925, MRN [id number]  PCP:  Celene Squibb, MD  Cardiologist:  Carlyle Dolly, MD Electrophysiologist:  None   Chief Complaint: Follow-up of atrial fibrillation, chronic diastolic heart failure, HTN.  History of Present Illness: Amanda Hale is a 86 y.o. female with a history of atrial fibrillation, chronic diastolic heart failure, HTN, DM 2, hyperlipidemia.  Last encounter with Dr. Harl Bowie 08/15/2019.  History of atrial fibrillation with RVR 12/2017.  Started on diltiazem 120 mg daily.  Lopressor had been increased to 100 mg p.o. twice daily.  No bleeding on Eliquis.  No recent lower extremity edema or shortness of breath/DOE.  Vertigo managed with meclizine.  She was compliant with her antihypertensive medications. Was having some palpitations .  Was taking metoprolol 100 mg every afternoon and 75 mg am.   She is here today for 69-month follow-up.  In the interim she had a recent fall on 04/01/2020 from a mechanical fall impacting her right side with contusions and abrasions with a contusion to her right eye.  States she has been very sore since then.  She presented to [facility] and had multiple x-rays and CT of the head and cervical spine and maxillofacial areas.  No evidence of acute recurrent intracranial or cervical spine injury.  Right supraorbital contusion without fracture.  No evidence of right hip fracture.  Evidence total right hip arthroplasty which is incompletely covered.  Tibia-fibula x-ray negative for fracture.  Negative chest x-ray.  She presents today with no complaints other than being sore from her fall.  She sitting in a wheelchair resting.  Vital signs are stable.  EKG shows atrial fibrillation with slow ventricular response with a rate of 52.  She denies any bleeding.  She continues on apixaban 2.5 mg p.o. twice daily.  She mentions some swelling in her ankles since she has been more immobile  after her fall.  She is sitting in a wheelchair today.  It appears in the interim since her last visit she is currently not listed as taking furosemide.  She had furosemide listed in the past as needed for lower extremity edema and weight gain.  He was taking it approximately 1 time per month.  She has some chronic lower extremity edema.  Her daughter who is with her states she seen the edema better and has seen it worse.  Daughter was unaware that patient was not currently on Lasix.   Past Medical History:  Diagnosis Date  . Arthritis   . DM type 2 (diabetes mellitus, type 2) ([place])   . Dyslipidemia   . Hiatal hernia 2014  . HTN (hypertension)   . Osteoporosis   . Reflux esophagitis 2014   erosive  . Schatzki's ring 2014  . Tubular adenoma     Past Surgical History:  Procedure Laterality Date  . ABDOMINAL HYSTERECTOMY    . APPENDECTOMY    . BREAST BIOPSY    . COLONOSCOPY N/A 09/08/2012   RMR: tubular adenoma, poor prep, needs surveillance April 2015   . COLONOSCOPY  1990/1992/1996   [facility], multiple hyperplastic polyps  . ESOPHAGOGASTRODUODENOSCOPY N/A 10/09/2015   RMR: pyloric stenosis dilated with scope.  . ESOPHAGOGASTRODUODENOSCOPY (EGD) WITH ESOPHAGEAL DILATION N/A 09/08/2012   RMR: Schatzki's ring s/p 54- F dilation, erosive esophagitis, hiatal hernia, gastric/bulb erosions with mild pyloric stenosis. Negative path  . EYE SURGERY     both  . HIP SURGERY  right x 2  . RECTAL SURGERY     prolapsed rectum/hemorrhoids    Current Outpatient Medications  Medication Sig Dispense Refill  . acetaminophen (TYLENOL) 500 MG tablet Take 500 mg by mouth every 8 (eight) hours as needed.    . ALPRAZolam (XANAX) 0.5 MG tablet Take 0.5 mg by mouth 3 (three) times daily as needed for anxiety.    Marland Kitchen apixaban (ELIQUIS) 2.5 MG TABS tablet Take 1 tablet (2.5 mg total) by mouth 2 (two) times daily. 60 tablet 0  . ascorbic acid (VITAMIN C) 500 MG tablet Take 500 mg by  mouth daily.    . calcium carbonate (TUMS - DOSED IN MG ELEMENTAL CALCIUM) 500 MG chewable tablet Chew 1 tablet by mouth every 8 (eight) hours as needed for indigestion or heartburn.    . cholecalciferol (VITAMIN D3) 25 MCG (1000 UNIT) tablet Take 1,000 Units by mouth daily.    . Diclofenac Sodium 3 % CREA Apply 1 application topically 2 (two) times daily.    Marland Kitchen diltiazem (CARDIZEM CD) 120 MG 24 hr capsule Take 120 mg by mouth daily.    Marland Kitchen donepezil (ARICEPT) 10 MG tablet Take 1 tablet (10 mg total) by mouth at bedtime. 30 tablet 0  . HYDROcodone-acetaminophen (NORCO/VICODIN) 5-325 MG tablet Take 1 tablet by mouth every 8 (eight) hours as needed for moderate pain.    Marland Kitchen loperamide (IMODIUM) 2 MG capsule Take 2 mg by mouth daily as needed for diarrhea or loose stools.    . meclizine (ANTIVERT) 25 MG tablet Take 1 tablet (25 mg total) by mouth 2 (two) times daily. (Patient taking differently: Take 25 mg by mouth every 6 (six) hours as needed.) 60 tablet 0  . memantine (NAMENDA) 5 MG tablet Take 1 tablet (5 mg total) by mouth 2 (two) times daily. 60 tablet 0  . metoprolol tartrate (LOPRESSOR) 50 MG tablet TAKE 75 MG IN THE MORNING AND 100 MG IN THE EVENING (Patient taking differently: Take 50 mg by mouth 2 (two) times daily. TAKE 75 MG IN THE MORNING AND 100 MG IN THE EVENING) 315 tablet 1  . Multiple Vitamin (MULTIVITAMIN WITH MINERALS) TABS tablet Take 1 tablet by mouth daily.    . Multiple Vitamins-Minerals (PRESERVISION AREDS 2 PO) Take 1 tablet by mouth 2 (two) times daily.    . potassium chloride (KLOR-CON) 10 MEQ tablet Take 10 mEq by mouth daily.    . risperiDONE (RISPERDAL) 0.25 MG tablet Take 1 tablet (0.25 mg total) by mouth at bedtime. 30 tablet 0  . zinc gluconate 50 MG tablet Take 50 mg by mouth daily.     No current facility-administered medications for this visit.   Allergies:  Patient has no known allergies.   Social History: The patient  reports that she has never smoked. She has  never used smokeless tobacco. She reports that she does not drink alcohol and does not use drugs.   Family History: The patient's family history includes Hypertension in her mother and son.   ROS:  Please see the history of present illness. Otherwise, complete review of systems is positive for none.  All other systems are reviewed and negative.   Physical Exam: VS:  BP 112/62   Pulse 62   Ht 4\' 11"  (1.499 m)   Wt 133 lb (60.3 kg)   SpO2 99%   BMI 26.86 kg/m , BMI Body mass index is 26.86 kg/m.  Wt Readings from Last 3 Encounters:  05/07/20 133 lb (60.3 kg)  08/15/19 133 lb (60.3 kg)  04/05/19 141 lb 12.8 oz (64.3 kg)    General: Patient appears comfortable at rest. Neck: Supple, no elevated JVP or carotid bruits, no thyromegaly. Lungs: Clear to auscultation, nonlabored breathing at rest. Cardiac: Irregularly irregular rate and rhythm, no S3 or significant systolic murmur, no pericardial rub. Extremities: Mild pitting edema, distal pulses 2+. Skin: Warm and dry. Musculoskeletal: No kyphosis. Neuropsychiatric: Alert and oriented x3, affect grossly appropriate.  ECG:  An ECG dated 05/07/2020 was personally reviewed today and demonstrated:  Atrial fibrillation with a slow ventricular response rate of 52.  Recent Labwork: No results found for requested labs within last 8760 hours.  No results found for: CHOL, TRIG, HDL, CHOLHDL, VLDL, LDLCALC, LDLDIRECT  Other Studies Reviewed Today:   07/2015 echo Study Conclusions  - Left ventricle: The cavity size was normal. Wall thickness was increased in a pattern of mild LVH. Systolic function was normal. The estimated ejection fraction was in the range of 60% to 65%. Wall motion was normal; there were no regional wall motion abnormalities. Doppler parameters are consistent with abnormal left ventricular relaxation (grade 1 diastolic dysfunction). - Aortic valve: Moderately calcified annulus. Mildly thickened, mildly  calcified leaflets. - Mitral valve: Calcified annulus. Mildly thickened, mildly calcified leaflets .   12/2017 echo ------------------------------------------------------------------- Study Conclusions  - Left ventricle: The cavity size was normal. Wall thickness was normal. Systolic function was normal. The estimated ejection fraction was in the range of 50% to 55%. Wall motion was normal; there were no regional wall motion abnormalities. - Aortic valve: Mildly calcified annulus. Trileaflet; mildly thickened leaflets. Valve area (VTI): 0.87 cm^2. Valve area (Vmax): 0.97 cm^2. Valve area (Vmean): 0.88 cm^2. - Mitral valve: Mildly calcified annulus. Mildly thickened leaflets . - Left atrium: The atrium was mildly dilated. - Right atrium: The atrium was mildly dilated. - Atrial septum: No defect or patent foramen ovale was identified.     Assessment and Plan:  1. Chronic diastolic heart failure ([place])   2. Atrial fibrillation, unspecified type ([place])   3. Essential hypertension    1. Chronic diastolic heart failure (H[facility]) Current weight today is 133.  There has been no change in weight since previous visit in March when she weighed 133.  She denies any dyspnea on exertion.  She does have some chronic lower extremity edema.  She was taking Lasix as needed.  This was not on her medication list at [facility] where she is currently residing.  Will restart Lasix 20 mg as needed for lower extremity edema.  2. Atrial fibrillation, unspecified type ([place]) Rate is controlled at 52 bpm.  Continue metoprolol 75 mg a.m. and 100 mg p.m.  Continue Eliquis 2.5 mg p.o. twice daily.  3. Essential hypertension Blood pressure today 112/62.  Continue metoprolol 75 mg a.m. and 100 mg p.m.  Medication Adjustments/Labs and Tests Ordered: Current medicines are reviewed at length with the patient today.  Concerns regarding medicines are outlined above.   Disposition: Follow-up with Dr.  Harl Bowie or APP 6 months.  Signed, Levell July, NP 05/07/2020 2:25 PM    [facility] at [place], [place], [place] [address] Phone: [phone]; Fax: [phone]

## 2020-05-07 ENCOUNTER — Encounter: Payer: Self-pay | Admitting: Family Medicine

## 2020-05-07 ENCOUNTER — Ambulatory Visit (INDEPENDENT_AMBULATORY_CARE_PROVIDER_SITE_OTHER): Payer: Medicare Other | Admitting: Family Medicine

## 2020-05-07 VITALS — BP 112/62 | HR 62 | Ht 59.0 in | Wt 133.0 lb

## 2020-05-07 DIAGNOSIS — I5032 Chronic diastolic (congestive) heart failure: Secondary | ICD-10-CM

## 2020-05-07 DIAGNOSIS — I4891 Unspecified atrial fibrillation: Secondary | ICD-10-CM

## 2020-05-07 DIAGNOSIS — I10 Essential (primary) hypertension: Secondary | ICD-10-CM

## 2020-05-07 MED ORDER — FUROSEMIDE 20 MG PO TABS: 20.0000 mg | ORAL_TABLET | Freq: Every day | ORAL | 3 refills | Status: AC | PRN

## 2020-05-07 NOTE — Patient Instructions (Signed)
Medication Instructions:   Your physician has recommended you make the following change in your medication:   Restart furosemide 20 mg by mouth daily as needed for swelling  Continue other medications the same  Labwork:  None  Testing/Procedures:  None  Follow-Up:  Your physician recommends that you schedule a follow-up appointment in: 6 months.  Any Other Special Instructions Will Be Listed Below (If Applicable).  If you need a refill on your cardiac medications before your next appointment, please call your pharmacy.

## 2020-05-07 NOTE — Addendum Note (Signed)
Addended by: Merlene Laughter on: 05/07/2020 04:44 PM   Modules accepted: Orders

## 2020-05-16 DIAGNOSIS — Z6834 Body mass index (BMI) 34.0-34.9, adult: Secondary | ICD-10-CM | POA: Diagnosis not present

## 2020-05-16 DIAGNOSIS — I10 Essential (primary) hypertension: Secondary | ICD-10-CM | POA: Diagnosis not present

## 2020-05-16 DIAGNOSIS — Z1339 Encounter for screening examination for other mental health and behavioral disorders: Secondary | ICD-10-CM | POA: Diagnosis not present

## 2020-05-16 DIAGNOSIS — F0390 Unspecified dementia without behavioral disturbance: Secondary | ICD-10-CM | POA: Diagnosis not present

## 2020-05-16 DIAGNOSIS — D692 Other nonthrombocytopenic purpura: Secondary | ICD-10-CM | POA: Diagnosis not present

## 2020-05-16 DIAGNOSIS — F419 Anxiety disorder, unspecified: Secondary | ICD-10-CM | POA: Diagnosis not present

## 2020-05-16 DIAGNOSIS — Z1331 Encounter for screening for depression: Secondary | ICD-10-CM | POA: Diagnosis not present

## 2020-05-16 DIAGNOSIS — Z0000 Encounter for general adult medical examination without abnormal findings: Secondary | ICD-10-CM | POA: Diagnosis not present

## 2020-05-16 DIAGNOSIS — Z299 Encounter for prophylactic measures, unspecified: Secondary | ICD-10-CM | POA: Diagnosis not present

## 2020-05-16 DIAGNOSIS — Z7189 Other specified counseling: Secondary | ICD-10-CM | POA: Diagnosis not present

## 2020-05-22 DIAGNOSIS — Z79899 Other long term (current) drug therapy: Secondary | ICD-10-CM | POA: Diagnosis not present

## 2020-05-22 DIAGNOSIS — F419 Anxiety disorder, unspecified: Secondary | ICD-10-CM | POA: Diagnosis not present

## 2020-05-23 DIAGNOSIS — Z4800 Encounter for change or removal of nonsurgical wound dressing: Secondary | ICD-10-CM | POA: Diagnosis not present

## 2020-05-23 DIAGNOSIS — L89312 Pressure ulcer of right buttock, stage 2: Secondary | ICD-10-CM | POA: Diagnosis not present

## 2020-05-23 DIAGNOSIS — R32 Unspecified urinary incontinence: Secondary | ICD-10-CM | POA: Diagnosis not present

## 2020-05-23 DIAGNOSIS — L89322 Pressure ulcer of left buttock, stage 2: Secondary | ICD-10-CM | POA: Diagnosis not present

## 2020-05-30 DIAGNOSIS — F0390 Unspecified dementia without behavioral disturbance: Secondary | ICD-10-CM | POA: Diagnosis not present

## 2020-05-30 DIAGNOSIS — I10 Essential (primary) hypertension: Secondary | ICD-10-CM | POA: Diagnosis not present

## 2020-05-30 DIAGNOSIS — K649 Unspecified hemorrhoids: Secondary | ICD-10-CM | POA: Diagnosis not present

## 2020-05-30 DIAGNOSIS — I509 Heart failure, unspecified: Secondary | ICD-10-CM | POA: Diagnosis not present

## 2020-05-30 DIAGNOSIS — Z299 Encounter for prophylactic measures, unspecified: Secondary | ICD-10-CM | POA: Diagnosis not present

## 2020-07-03 DIAGNOSIS — R404 Transient alteration of awareness: Secondary | ICD-10-CM | POA: Diagnosis not present

## 2020-07-03 DIAGNOSIS — S0990XA Unspecified injury of head, initial encounter: Secondary | ICD-10-CM | POA: Diagnosis not present

## 2020-07-03 DIAGNOSIS — M19011 Primary osteoarthritis, right shoulder: Secondary | ICD-10-CM | POA: Diagnosis not present

## 2020-07-03 DIAGNOSIS — E119 Type 2 diabetes mellitus without complications: Secondary | ICD-10-CM | POA: Diagnosis not present

## 2020-07-03 DIAGNOSIS — G4489 Other headache syndrome: Secondary | ICD-10-CM | POA: Diagnosis not present

## 2020-07-03 DIAGNOSIS — I700 Atherosclerosis of aorta: Secondary | ICD-10-CM | POA: Diagnosis not present

## 2020-07-03 DIAGNOSIS — R519 Headache, unspecified: Secondary | ICD-10-CM | POA: Diagnosis not present

## 2020-07-03 DIAGNOSIS — S0003XA Contusion of scalp, initial encounter: Secondary | ICD-10-CM | POA: Diagnosis not present

## 2020-07-03 DIAGNOSIS — I110 Hypertensive heart disease with heart failure: Secondary | ICD-10-CM | POA: Diagnosis not present

## 2020-07-03 DIAGNOSIS — K8080 Other cholelithiasis without obstruction: Secondary | ICD-10-CM | POA: Diagnosis not present

## 2020-07-03 DIAGNOSIS — K5790 Diverticulosis of intestine, part unspecified, without perforation or abscess without bleeding: Secondary | ICD-10-CM | POA: Diagnosis not present

## 2020-07-03 DIAGNOSIS — S199XXA Unspecified injury of neck, initial encounter: Secondary | ICD-10-CM | POA: Diagnosis not present

## 2020-07-03 DIAGNOSIS — S0083XA Contusion of other part of head, initial encounter: Secondary | ICD-10-CM | POA: Diagnosis not present

## 2020-07-03 DIAGNOSIS — I509 Heart failure, unspecified: Secondary | ICD-10-CM | POA: Diagnosis not present

## 2020-07-03 DIAGNOSIS — K5730 Diverticulosis of large intestine without perforation or abscess without bleeding: Secondary | ICD-10-CM | POA: Diagnosis not present

## 2020-07-03 DIAGNOSIS — I2510 Atherosclerotic heart disease of native coronary artery without angina pectoris: Secondary | ICD-10-CM | POA: Diagnosis not present

## 2020-07-03 DIAGNOSIS — W1839XA Other fall on same level, initial encounter: Secondary | ICD-10-CM | POA: Diagnosis not present

## 2020-07-03 DIAGNOSIS — Z79899 Other long term (current) drug therapy: Secondary | ICD-10-CM | POA: Diagnosis not present

## 2020-07-03 DIAGNOSIS — K8020 Calculus of gallbladder without cholecystitis without obstruction: Secondary | ICD-10-CM | POA: Diagnosis not present

## 2020-07-03 DIAGNOSIS — R410 Disorientation, unspecified: Secondary | ICD-10-CM | POA: Diagnosis not present

## 2020-07-03 DIAGNOSIS — K8689 Other specified diseases of pancreas: Secondary | ICD-10-CM | POA: Diagnosis not present

## 2020-07-04 DIAGNOSIS — I4891 Unspecified atrial fibrillation: Secondary | ICD-10-CM | POA: Diagnosis not present

## 2020-07-04 DIAGNOSIS — Z299 Encounter for prophylactic measures, unspecified: Secondary | ICD-10-CM | POA: Diagnosis not present

## 2020-07-04 DIAGNOSIS — I700 Atherosclerosis of aorta: Secondary | ICD-10-CM | POA: Diagnosis not present

## 2020-07-04 DIAGNOSIS — S0003XA Contusion of scalp, initial encounter: Secondary | ICD-10-CM | POA: Diagnosis not present

## 2020-07-04 DIAGNOSIS — I10 Essential (primary) hypertension: Secondary | ICD-10-CM | POA: Diagnosis not present

## 2020-07-04 DIAGNOSIS — S199XXA Unspecified injury of neck, initial encounter: Secondary | ICD-10-CM | POA: Diagnosis not present

## 2020-07-04 DIAGNOSIS — R8290 Unspecified abnormal findings in urine: Secondary | ICD-10-CM | POA: Diagnosis not present

## 2020-07-05 DIAGNOSIS — R8290 Unspecified abnormal findings in urine: Secondary | ICD-10-CM | POA: Diagnosis not present

## 2020-07-16 DIAGNOSIS — E1151 Type 2 diabetes mellitus with diabetic peripheral angiopathy without gangrene: Secondary | ICD-10-CM | POA: Diagnosis not present

## 2020-07-16 DIAGNOSIS — L84 Corns and callosities: Secondary | ICD-10-CM | POA: Diagnosis not present

## 2020-07-18 DIAGNOSIS — Z299 Encounter for prophylactic measures, unspecified: Secondary | ICD-10-CM | POA: Diagnosis not present

## 2020-07-18 DIAGNOSIS — L89312 Pressure ulcer of right buttock, stage 2: Secondary | ICD-10-CM | POA: Diagnosis not present

## 2020-07-18 DIAGNOSIS — Z4800 Encounter for change or removal of nonsurgical wound dressing: Secondary | ICD-10-CM | POA: Diagnosis not present

## 2020-07-18 DIAGNOSIS — I10 Essential (primary) hypertension: Secondary | ICD-10-CM | POA: Diagnosis not present

## 2020-07-18 DIAGNOSIS — L8992 Pressure ulcer of unspecified site, stage 2: Secondary | ICD-10-CM | POA: Diagnosis not present

## 2020-07-18 DIAGNOSIS — L89322 Pressure ulcer of left buttock, stage 2: Secondary | ICD-10-CM | POA: Diagnosis not present

## 2020-07-18 DIAGNOSIS — I4891 Unspecified atrial fibrillation: Secondary | ICD-10-CM | POA: Diagnosis not present

## 2020-07-18 DIAGNOSIS — R32 Unspecified urinary incontinence: Secondary | ICD-10-CM | POA: Diagnosis not present

## 2020-07-30 DIAGNOSIS — N289 Disorder of kidney and ureter, unspecified: Secondary | ICD-10-CM | POA: Diagnosis not present

## 2020-07-30 DIAGNOSIS — I10 Essential (primary) hypertension: Secondary | ICD-10-CM | POA: Diagnosis not present

## 2020-07-30 DIAGNOSIS — E785 Hyperlipidemia, unspecified: Secondary | ICD-10-CM | POA: Diagnosis not present

## 2020-07-30 DIAGNOSIS — K219 Gastro-esophageal reflux disease without esophagitis: Secondary | ICD-10-CM | POA: Diagnosis not present

## 2020-07-30 DIAGNOSIS — F419 Anxiety disorder, unspecified: Secondary | ICD-10-CM | POA: Diagnosis not present

## 2020-07-30 DIAGNOSIS — I509 Heart failure, unspecified: Secondary | ICD-10-CM | POA: Diagnosis not present

## 2020-07-30 DIAGNOSIS — R059 Cough, unspecified: Secondary | ICD-10-CM | POA: Diagnosis not present

## 2020-07-30 DIAGNOSIS — R0902 Hypoxemia: Secondary | ICD-10-CM | POA: Diagnosis not present

## 2020-07-30 DIAGNOSIS — J209 Acute bronchitis, unspecified: Secondary | ICD-10-CM | POA: Diagnosis not present

## 2020-07-30 DIAGNOSIS — E119 Type 2 diabetes mellitus without complications: Secondary | ICD-10-CM | POA: Diagnosis not present

## 2020-07-30 DIAGNOSIS — R001 Bradycardia, unspecified: Secondary | ICD-10-CM | POA: Diagnosis not present

## 2020-07-30 DIAGNOSIS — R531 Weakness: Secondary | ICD-10-CM | POA: Diagnosis not present

## 2020-07-30 DIAGNOSIS — I4891 Unspecified atrial fibrillation: Secondary | ICD-10-CM | POA: Diagnosis not present

## 2020-07-30 DIAGNOSIS — Z79899 Other long term (current) drug therapy: Secondary | ICD-10-CM | POA: Diagnosis not present

## 2020-07-30 DIAGNOSIS — J40 Bronchitis, not specified as acute or chronic: Secondary | ICD-10-CM | POA: Diagnosis not present

## 2020-08-01 DIAGNOSIS — Z299 Encounter for prophylactic measures, unspecified: Secondary | ICD-10-CM | POA: Diagnosis not present

## 2020-08-01 DIAGNOSIS — I509 Heart failure, unspecified: Secondary | ICD-10-CM | POA: Diagnosis not present

## 2020-08-01 DIAGNOSIS — I700 Atherosclerosis of aorta: Secondary | ICD-10-CM | POA: Diagnosis not present

## 2020-08-01 DIAGNOSIS — I10 Essential (primary) hypertension: Secondary | ICD-10-CM | POA: Diagnosis not present

## 2020-08-01 DIAGNOSIS — J22 Unspecified acute lower respiratory infection: Secondary | ICD-10-CM | POA: Diagnosis not present

## 2020-08-08 DIAGNOSIS — I509 Heart failure, unspecified: Secondary | ICD-10-CM | POA: Diagnosis not present

## 2020-08-08 DIAGNOSIS — I10 Essential (primary) hypertension: Secondary | ICD-10-CM | POA: Diagnosis not present

## 2020-08-08 DIAGNOSIS — E1165 Type 2 diabetes mellitus with hyperglycemia: Secondary | ICD-10-CM | POA: Diagnosis not present

## 2020-08-08 DIAGNOSIS — Z299 Encounter for prophylactic measures, unspecified: Secondary | ICD-10-CM | POA: Diagnosis not present

## 2020-08-08 DIAGNOSIS — L8992 Pressure ulcer of unspecified site, stage 2: Secondary | ICD-10-CM | POA: Diagnosis not present

## 2020-08-08 DIAGNOSIS — F0390 Unspecified dementia without behavioral disturbance: Secondary | ICD-10-CM | POA: Diagnosis not present

## 2020-08-16 DIAGNOSIS — R32 Unspecified urinary incontinence: Secondary | ICD-10-CM | POA: Diagnosis not present

## 2020-08-16 DIAGNOSIS — I159 Secondary hypertension, unspecified: Secondary | ICD-10-CM | POA: Diagnosis not present

## 2020-08-16 DIAGNOSIS — I130 Hypertensive heart and chronic kidney disease with heart failure and stage 1 through stage 4 chronic kidney disease, or unspecified chronic kidney disease: Secondary | ICD-10-CM | POA: Diagnosis not present

## 2020-08-16 DIAGNOSIS — L89312 Pressure ulcer of right buttock, stage 2: Secondary | ICD-10-CM | POA: Diagnosis not present

## 2020-09-02 DIAGNOSIS — R404 Transient alteration of awareness: Secondary | ICD-10-CM | POA: Diagnosis not present

## 2020-09-02 DIAGNOSIS — I700 Atherosclerosis of aorta: Secondary | ICD-10-CM | POA: Diagnosis not present

## 2020-09-02 DIAGNOSIS — J432 Centrilobular emphysema: Secondary | ICD-10-CM | POA: Diagnosis not present

## 2020-09-02 DIAGNOSIS — I2510 Atherosclerotic heart disease of native coronary artery without angina pectoris: Secondary | ICD-10-CM | POA: Diagnosis not present

## 2020-09-02 DIAGNOSIS — Z981 Arthrodesis status: Secondary | ICD-10-CM | POA: Diagnosis not present

## 2020-09-02 DIAGNOSIS — K8080 Other cholelithiasis without obstruction: Secondary | ICD-10-CM | POA: Diagnosis not present

## 2020-09-02 DIAGNOSIS — S299XXA Unspecified injury of thorax, initial encounter: Secondary | ICD-10-CM | POA: Diagnosis not present

## 2020-09-02 DIAGNOSIS — M47812 Spondylosis without myelopathy or radiculopathy, cervical region: Secondary | ICD-10-CM | POA: Diagnosis not present

## 2020-09-02 DIAGNOSIS — M5030 Other cervical disc degeneration, unspecified cervical region: Secondary | ICD-10-CM | POA: Diagnosis not present

## 2020-09-02 DIAGNOSIS — I4891 Unspecified atrial fibrillation: Secondary | ICD-10-CM | POA: Diagnosis not present

## 2020-09-02 DIAGNOSIS — S12041A Nondisplaced lateral mass fracture of first cervical vertebra, initial encounter for closed fracture: Secondary | ICD-10-CM | POA: Diagnosis not present

## 2020-09-02 DIAGNOSIS — R911 Solitary pulmonary nodule: Secondary | ICD-10-CM | POA: Diagnosis not present

## 2020-09-02 DIAGNOSIS — I110 Hypertensive heart disease with heart failure: Secondary | ICD-10-CM | POA: Diagnosis not present

## 2020-09-02 DIAGNOSIS — M4326 Fusion of spine, lumbar region: Secondary | ICD-10-CM | POA: Diagnosis not present

## 2020-09-02 DIAGNOSIS — I509 Heart failure, unspecified: Secondary | ICD-10-CM | POA: Diagnosis not present

## 2020-09-02 DIAGNOSIS — X58XXXA Exposure to other specified factors, initial encounter: Secondary | ICD-10-CM | POA: Diagnosis not present

## 2020-09-02 DIAGNOSIS — I959 Hypotension, unspecified: Secondary | ICD-10-CM | POA: Diagnosis not present

## 2020-09-02 DIAGNOSIS — Z7401 Bed confinement status: Secondary | ICD-10-CM | POA: Diagnosis not present

## 2020-09-02 DIAGNOSIS — M542 Cervicalgia: Secondary | ICD-10-CM | POA: Diagnosis not present

## 2020-09-02 DIAGNOSIS — M549 Dorsalgia, unspecified: Secondary | ICD-10-CM | POA: Diagnosis not present

## 2020-09-02 DIAGNOSIS — E119 Type 2 diabetes mellitus without complications: Secondary | ICD-10-CM | POA: Diagnosis not present

## 2020-09-02 DIAGNOSIS — S22028A Other fracture of second thoracic vertebra, initial encounter for closed fracture: Secondary | ICD-10-CM | POA: Diagnosis not present

## 2020-09-02 DIAGNOSIS — K8020 Calculus of gallbladder without cholecystitis without obstruction: Secondary | ICD-10-CM | POA: Diagnosis not present

## 2020-09-02 DIAGNOSIS — N281 Cyst of kidney, acquired: Secondary | ICD-10-CM | POA: Diagnosis not present

## 2020-09-05 DIAGNOSIS — I10 Essential (primary) hypertension: Secondary | ICD-10-CM | POA: Diagnosis not present

## 2020-09-05 DIAGNOSIS — Z299 Encounter for prophylactic measures, unspecified: Secondary | ICD-10-CM | POA: Diagnosis not present

## 2020-09-05 DIAGNOSIS — I509 Heart failure, unspecified: Secondary | ICD-10-CM | POA: Diagnosis not present

## 2020-09-05 DIAGNOSIS — E1165 Type 2 diabetes mellitus with hyperglycemia: Secondary | ICD-10-CM | POA: Diagnosis not present

## 2020-09-05 DIAGNOSIS — M4850XA Collapsed vertebra, not elsewhere classified, site unspecified, initial encounter for fracture: Secondary | ICD-10-CM | POA: Diagnosis not present

## 2020-09-09 DIAGNOSIS — Z7901 Long term (current) use of anticoagulants: Secondary | ICD-10-CM | POA: Diagnosis not present

## 2020-09-09 DIAGNOSIS — E119 Type 2 diabetes mellitus without complications: Secondary | ICD-10-CM | POA: Diagnosis not present

## 2020-09-09 DIAGNOSIS — E860 Dehydration: Secondary | ICD-10-CM | POA: Diagnosis not present

## 2020-09-09 DIAGNOSIS — R627 Adult failure to thrive: Secondary | ICD-10-CM | POA: Diagnosis not present

## 2020-09-09 DIAGNOSIS — R9431 Abnormal electrocardiogram [ECG] [EKG]: Secondary | ICD-10-CM | POA: Diagnosis not present

## 2020-09-09 DIAGNOSIS — N390 Urinary tract infection, site not specified: Secondary | ICD-10-CM | POA: Diagnosis not present

## 2020-09-09 DIAGNOSIS — I4891 Unspecified atrial fibrillation: Secondary | ICD-10-CM | POA: Diagnosis not present

## 2020-09-09 DIAGNOSIS — I480 Paroxysmal atrial fibrillation: Secondary | ICD-10-CM | POA: Diagnosis not present

## 2020-09-09 DIAGNOSIS — I509 Heart failure, unspecified: Secondary | ICD-10-CM | POA: Diagnosis not present

## 2020-09-09 DIAGNOSIS — R5381 Other malaise: Secondary | ICD-10-CM | POA: Diagnosis not present

## 2020-09-09 DIAGNOSIS — E785 Hyperlipidemia, unspecified: Secondary | ICD-10-CM | POA: Diagnosis not present

## 2020-09-09 DIAGNOSIS — I5032 Chronic diastolic (congestive) heart failure: Secondary | ICD-10-CM | POA: Diagnosis not present

## 2020-09-09 DIAGNOSIS — F0390 Unspecified dementia without behavioral disturbance: Secondary | ICD-10-CM | POA: Diagnosis not present

## 2020-09-09 DIAGNOSIS — Z6820 Body mass index (BMI) 20.0-20.9, adult: Secondary | ICD-10-CM | POA: Diagnosis not present

## 2020-09-09 DIAGNOSIS — R531 Weakness: Secondary | ICD-10-CM | POA: Diagnosis not present

## 2020-09-09 DIAGNOSIS — E869 Volume depletion, unspecified: Secondary | ICD-10-CM | POA: Diagnosis not present

## 2020-09-09 DIAGNOSIS — R2689 Other abnormalities of gait and mobility: Secondary | ICD-10-CM | POA: Diagnosis not present

## 2020-09-09 DIAGNOSIS — Z20822 Contact with and (suspected) exposure to covid-19: Secondary | ICD-10-CM | POA: Diagnosis not present

## 2020-09-09 DIAGNOSIS — Z79899 Other long term (current) drug therapy: Secondary | ICD-10-CM | POA: Diagnosis not present

## 2020-09-09 DIAGNOSIS — Z7409 Other reduced mobility: Secondary | ICD-10-CM | POA: Diagnosis not present

## 2020-09-09 DIAGNOSIS — Z9181 History of falling: Secondary | ICD-10-CM | POA: Diagnosis not present

## 2020-09-09 DIAGNOSIS — R5383 Other fatigue: Secondary | ICD-10-CM | POA: Diagnosis not present

## 2020-09-09 DIAGNOSIS — R4182 Altered mental status, unspecified: Secondary | ICD-10-CM | POA: Diagnosis not present

## 2020-09-09 DIAGNOSIS — I110 Hypertensive heart disease with heart failure: Secondary | ICD-10-CM | POA: Diagnosis not present

## 2020-09-09 DIAGNOSIS — R262 Difficulty in walking, not elsewhere classified: Secondary | ICD-10-CM | POA: Diagnosis not present

## 2020-09-10 DIAGNOSIS — E119 Type 2 diabetes mellitus without complications: Secondary | ICD-10-CM | POA: Diagnosis not present

## 2020-09-10 DIAGNOSIS — R531 Weakness: Secondary | ICD-10-CM | POA: Diagnosis not present

## 2020-09-10 DIAGNOSIS — I110 Hypertensive heart disease with heart failure: Secondary | ICD-10-CM | POA: Diagnosis not present

## 2020-09-10 DIAGNOSIS — I5032 Chronic diastolic (congestive) heart failure: Secondary | ICD-10-CM | POA: Diagnosis not present

## 2020-09-10 DIAGNOSIS — E869 Volume depletion, unspecified: Secondary | ICD-10-CM | POA: Diagnosis not present

## 2020-09-10 DIAGNOSIS — F0390 Unspecified dementia without behavioral disturbance: Secondary | ICD-10-CM | POA: Diagnosis not present

## 2020-09-10 DIAGNOSIS — I480 Paroxysmal atrial fibrillation: Secondary | ICD-10-CM | POA: Diagnosis not present

## 2020-09-12 DIAGNOSIS — I4891 Unspecified atrial fibrillation: Secondary | ICD-10-CM | POA: Diagnosis not present

## 2020-09-12 DIAGNOSIS — Z299 Encounter for prophylactic measures, unspecified: Secondary | ICD-10-CM | POA: Diagnosis not present

## 2020-09-12 DIAGNOSIS — F0390 Unspecified dementia without behavioral disturbance: Secondary | ICD-10-CM | POA: Diagnosis not present

## 2020-09-12 DIAGNOSIS — I10 Essential (primary) hypertension: Secondary | ICD-10-CM | POA: Diagnosis not present

## 2020-09-19 DIAGNOSIS — I10 Essential (primary) hypertension: Secondary | ICD-10-CM | POA: Diagnosis not present

## 2020-09-19 DIAGNOSIS — I4891 Unspecified atrial fibrillation: Secondary | ICD-10-CM | POA: Diagnosis not present

## 2020-09-19 DIAGNOSIS — I509 Heart failure, unspecified: Secondary | ICD-10-CM | POA: Diagnosis not present

## 2020-09-19 DIAGNOSIS — F0390 Unspecified dementia without behavioral disturbance: Secondary | ICD-10-CM | POA: Diagnosis not present

## 2020-09-19 DIAGNOSIS — Z299 Encounter for prophylactic measures, unspecified: Secondary | ICD-10-CM | POA: Diagnosis not present

## 2020-09-24 DIAGNOSIS — F0390 Unspecified dementia without behavioral disturbance: Secondary | ICD-10-CM | POA: Diagnosis not present

## 2020-09-24 DIAGNOSIS — M4850XA Collapsed vertebra, not elsewhere classified, site unspecified, initial encounter for fracture: Secondary | ICD-10-CM | POA: Diagnosis not present

## 2020-09-24 DIAGNOSIS — I4891 Unspecified atrial fibrillation: Secondary | ICD-10-CM | POA: Diagnosis not present

## 2020-09-24 DIAGNOSIS — L8992 Pressure ulcer of unspecified site, stage 2: Secondary | ICD-10-CM | POA: Diagnosis not present

## 2020-09-24 DIAGNOSIS — Z299 Encounter for prophylactic measures, unspecified: Secondary | ICD-10-CM | POA: Diagnosis not present

## 2020-09-25 DIAGNOSIS — I10 Essential (primary) hypertension: Secondary | ICD-10-CM | POA: Diagnosis not present

## 2020-09-25 DIAGNOSIS — R52 Pain, unspecified: Secondary | ICD-10-CM | POA: Diagnosis not present

## 2020-09-25 DIAGNOSIS — H3530 Unspecified macular degeneration: Secondary | ICD-10-CM | POA: Diagnosis not present

## 2020-09-25 DIAGNOSIS — M810 Age-related osteoporosis without current pathological fracture: Secondary | ICD-10-CM | POA: Diagnosis not present

## 2020-09-25 DIAGNOSIS — N189 Chronic kidney disease, unspecified: Secondary | ICD-10-CM | POA: Diagnosis not present

## 2020-09-25 DIAGNOSIS — E785 Hyperlipidemia, unspecified: Secondary | ICD-10-CM | POA: Diagnosis not present

## 2020-09-25 DIAGNOSIS — I2510 Atherosclerotic heart disease of native coronary artery without angina pectoris: Secondary | ICD-10-CM | POA: Diagnosis not present

## 2020-09-25 DIAGNOSIS — R531 Weakness: Secondary | ICD-10-CM | POA: Diagnosis not present

## 2020-09-25 DIAGNOSIS — I509 Heart failure, unspecified: Secondary | ICD-10-CM | POA: Diagnosis not present

## 2020-09-25 DIAGNOSIS — F419 Anxiety disorder, unspecified: Secondary | ICD-10-CM | POA: Diagnosis not present

## 2020-09-25 DIAGNOSIS — I4891 Unspecified atrial fibrillation: Secondary | ICD-10-CM | POA: Diagnosis not present

## 2020-09-25 DIAGNOSIS — E119 Type 2 diabetes mellitus without complications: Secondary | ICD-10-CM | POA: Diagnosis not present

## 2020-09-26 DIAGNOSIS — F419 Anxiety disorder, unspecified: Secondary | ICD-10-CM | POA: Diagnosis not present

## 2020-09-26 DIAGNOSIS — E119 Type 2 diabetes mellitus without complications: Secondary | ICD-10-CM | POA: Diagnosis not present

## 2020-09-26 DIAGNOSIS — E785 Hyperlipidemia, unspecified: Secondary | ICD-10-CM | POA: Diagnosis not present

## 2020-09-26 DIAGNOSIS — N189 Chronic kidney disease, unspecified: Secondary | ICD-10-CM | POA: Diagnosis not present

## 2020-09-26 DIAGNOSIS — I10 Essential (primary) hypertension: Secondary | ICD-10-CM | POA: Diagnosis not present

## 2020-09-26 DIAGNOSIS — I509 Heart failure, unspecified: Secondary | ICD-10-CM | POA: Diagnosis not present

## 2020-09-27 DIAGNOSIS — F419 Anxiety disorder, unspecified: Secondary | ICD-10-CM | POA: Diagnosis not present

## 2020-09-27 DIAGNOSIS — E785 Hyperlipidemia, unspecified: Secondary | ICD-10-CM | POA: Diagnosis not present

## 2020-09-27 DIAGNOSIS — I509 Heart failure, unspecified: Secondary | ICD-10-CM | POA: Diagnosis not present

## 2020-09-27 DIAGNOSIS — I10 Essential (primary) hypertension: Secondary | ICD-10-CM | POA: Diagnosis not present

## 2020-09-27 DIAGNOSIS — N189 Chronic kidney disease, unspecified: Secondary | ICD-10-CM | POA: Diagnosis not present

## 2020-09-27 DIAGNOSIS — E119 Type 2 diabetes mellitus without complications: Secondary | ICD-10-CM | POA: Diagnosis not present

## 2020-09-30 DIAGNOSIS — I509 Heart failure, unspecified: Secondary | ICD-10-CM | POA: Diagnosis not present

## 2020-09-30 DIAGNOSIS — I10 Essential (primary) hypertension: Secondary | ICD-10-CM | POA: Diagnosis not present

## 2020-09-30 DIAGNOSIS — N189 Chronic kidney disease, unspecified: Secondary | ICD-10-CM | POA: Diagnosis not present

## 2020-09-30 DIAGNOSIS — E119 Type 2 diabetes mellitus without complications: Secondary | ICD-10-CM | POA: Diagnosis not present

## 2020-09-30 DIAGNOSIS — E785 Hyperlipidemia, unspecified: Secondary | ICD-10-CM | POA: Diagnosis not present

## 2020-09-30 DIAGNOSIS — F419 Anxiety disorder, unspecified: Secondary | ICD-10-CM | POA: Diagnosis not present

## 2020-09-30 DIAGNOSIS — R350 Frequency of micturition: Secondary | ICD-10-CM | POA: Diagnosis not present

## 2020-10-01 DIAGNOSIS — Z043 Encounter for examination and observation following other accident: Secondary | ICD-10-CM | POA: Diagnosis not present

## 2020-10-01 DIAGNOSIS — I110 Hypertensive heart disease with heart failure: Secondary | ICD-10-CM | POA: Diagnosis not present

## 2020-10-01 DIAGNOSIS — I700 Atherosclerosis of aorta: Secondary | ICD-10-CM | POA: Diagnosis not present

## 2020-10-01 DIAGNOSIS — Z7401 Bed confinement status: Secondary | ICD-10-CM | POA: Diagnosis not present

## 2020-10-01 DIAGNOSIS — S12000A Unspecified displaced fracture of first cervical vertebra, initial encounter for closed fracture: Secondary | ICD-10-CM | POA: Diagnosis not present

## 2020-10-01 DIAGNOSIS — Z96641 Presence of right artificial hip joint: Secondary | ICD-10-CM | POA: Diagnosis not present

## 2020-10-01 DIAGNOSIS — Z471 Aftercare following joint replacement surgery: Secondary | ICD-10-CM | POA: Diagnosis not present

## 2020-10-01 DIAGNOSIS — M25551 Pain in right hip: Secondary | ICD-10-CM | POA: Diagnosis not present

## 2020-10-01 DIAGNOSIS — S0003XA Contusion of scalp, initial encounter: Secondary | ICD-10-CM | POA: Diagnosis not present

## 2020-10-01 DIAGNOSIS — W07XXXA Fall from chair, initial encounter: Secondary | ICD-10-CM | POA: Diagnosis not present

## 2020-10-01 DIAGNOSIS — F0390 Unspecified dementia without behavioral disturbance: Secondary | ICD-10-CM | POA: Diagnosis not present

## 2020-10-01 DIAGNOSIS — R519 Headache, unspecified: Secondary | ICD-10-CM | POA: Diagnosis not present

## 2020-10-01 DIAGNOSIS — I959 Hypotension, unspecified: Secondary | ICD-10-CM | POA: Diagnosis not present

## 2020-10-01 DIAGNOSIS — M25552 Pain in left hip: Secondary | ICD-10-CM | POA: Diagnosis not present

## 2020-10-01 DIAGNOSIS — M25572 Pain in left ankle and joints of left foot: Secondary | ICD-10-CM | POA: Diagnosis not present

## 2020-10-01 DIAGNOSIS — R531 Weakness: Secondary | ICD-10-CM | POA: Diagnosis not present

## 2020-10-01 DIAGNOSIS — E785 Hyperlipidemia, unspecified: Secondary | ICD-10-CM | POA: Diagnosis not present

## 2020-10-01 DIAGNOSIS — R52 Pain, unspecified: Secondary | ICD-10-CM | POA: Diagnosis not present

## 2020-10-01 DIAGNOSIS — I509 Heart failure, unspecified: Secondary | ICD-10-CM | POA: Diagnosis not present

## 2020-10-01 DIAGNOSIS — I4891 Unspecified atrial fibrillation: Secondary | ICD-10-CM | POA: Diagnosis not present

## 2020-10-01 DIAGNOSIS — M542 Cervicalgia: Secondary | ICD-10-CM | POA: Diagnosis not present

## 2020-10-01 DIAGNOSIS — E1151 Type 2 diabetes mellitus with diabetic peripheral angiopathy without gangrene: Secondary | ICD-10-CM | POA: Diagnosis not present

## 2020-10-03 DIAGNOSIS — E119 Type 2 diabetes mellitus without complications: Secondary | ICD-10-CM | POA: Diagnosis not present

## 2020-10-03 DIAGNOSIS — I509 Heart failure, unspecified: Secondary | ICD-10-CM | POA: Diagnosis not present

## 2020-10-03 DIAGNOSIS — E785 Hyperlipidemia, unspecified: Secondary | ICD-10-CM | POA: Diagnosis not present

## 2020-10-03 DIAGNOSIS — E1165 Type 2 diabetes mellitus with hyperglycemia: Secondary | ICD-10-CM | POA: Diagnosis not present

## 2020-10-03 DIAGNOSIS — N189 Chronic kidney disease, unspecified: Secondary | ICD-10-CM | POA: Diagnosis not present

## 2020-10-03 DIAGNOSIS — Z299 Encounter for prophylactic measures, unspecified: Secondary | ICD-10-CM | POA: Diagnosis not present

## 2020-10-03 DIAGNOSIS — I10 Essential (primary) hypertension: Secondary | ICD-10-CM | POA: Diagnosis not present

## 2020-10-03 DIAGNOSIS — Z713 Dietary counseling and surveillance: Secondary | ICD-10-CM | POA: Diagnosis not present

## 2020-10-03 DIAGNOSIS — N390 Urinary tract infection, site not specified: Secondary | ICD-10-CM | POA: Diagnosis not present

## 2020-10-03 DIAGNOSIS — F419 Anxiety disorder, unspecified: Secondary | ICD-10-CM | POA: Diagnosis not present

## 2020-10-07 ENCOUNTER — Inpatient Hospital Stay: Admission: AD | Admit: 2020-10-07 | Payer: Medicare Other | Admitting: Internal Medicine

## 2020-10-07 DIAGNOSIS — R4182 Altered mental status, unspecified: Secondary | ICD-10-CM | POA: Diagnosis not present

## 2020-10-07 DIAGNOSIS — I10 Essential (primary) hypertension: Secondary | ICD-10-CM | POA: Diagnosis not present

## 2020-10-07 DIAGNOSIS — R000 Tachycardia, unspecified: Secondary | ICD-10-CM | POA: Diagnosis not present

## 2020-10-07 DIAGNOSIS — L89150 Pressure ulcer of sacral region, unstageable: Secondary | ICD-10-CM | POA: Diagnosis not present

## 2020-10-07 DIAGNOSIS — F419 Anxiety disorder, unspecified: Secondary | ICD-10-CM | POA: Diagnosis not present

## 2020-10-07 DIAGNOSIS — R1032 Left lower quadrant pain: Secondary | ICD-10-CM | POA: Diagnosis not present

## 2020-10-07 DIAGNOSIS — K922 Gastrointestinal hemorrhage, unspecified: Secondary | ICD-10-CM | POA: Diagnosis not present

## 2020-10-07 DIAGNOSIS — Z7901 Long term (current) use of anticoagulants: Secondary | ICD-10-CM | POA: Diagnosis not present

## 2020-10-07 DIAGNOSIS — E1165 Type 2 diabetes mellitus with hyperglycemia: Secondary | ICD-10-CM | POA: Diagnosis not present

## 2020-10-07 DIAGNOSIS — Z743 Need for continuous supervision: Secondary | ICD-10-CM | POA: Diagnosis not present

## 2020-10-07 DIAGNOSIS — E785 Hyperlipidemia, unspecified: Secondary | ICD-10-CM | POA: Diagnosis not present

## 2020-10-07 DIAGNOSIS — I509 Heart failure, unspecified: Secondary | ICD-10-CM | POA: Diagnosis not present

## 2020-10-07 DIAGNOSIS — Z20822 Contact with and (suspected) exposure to covid-19: Secondary | ICD-10-CM | POA: Diagnosis not present

## 2020-10-07 DIAGNOSIS — N189 Chronic kidney disease, unspecified: Secondary | ICD-10-CM | POA: Diagnosis not present

## 2020-10-07 DIAGNOSIS — R58 Hemorrhage, not elsewhere classified: Secondary | ICD-10-CM | POA: Diagnosis not present

## 2020-10-07 DIAGNOSIS — E119 Type 2 diabetes mellitus without complications: Secondary | ICD-10-CM | POA: Diagnosis not present

## 2020-10-08 DIAGNOSIS — I509 Heart failure, unspecified: Secondary | ICD-10-CM | POA: Diagnosis not present

## 2020-10-08 DIAGNOSIS — M1612 Unilateral primary osteoarthritis, left hip: Secondary | ICD-10-CM | POA: Diagnosis not present

## 2020-10-08 DIAGNOSIS — I959 Hypotension, unspecified: Secondary | ICD-10-CM | POA: Diagnosis not present

## 2020-10-08 DIAGNOSIS — S99912A Unspecified injury of left ankle, initial encounter: Secondary | ICD-10-CM | POA: Diagnosis not present

## 2020-10-08 DIAGNOSIS — D649 Anemia, unspecified: Secondary | ICD-10-CM | POA: Diagnosis not present

## 2020-10-08 DIAGNOSIS — F419 Anxiety disorder, unspecified: Secondary | ICD-10-CM | POA: Diagnosis not present

## 2020-10-08 DIAGNOSIS — N189 Chronic kidney disease, unspecified: Secondary | ICD-10-CM | POA: Diagnosis not present

## 2020-10-08 DIAGNOSIS — S79922A Unspecified injury of left thigh, initial encounter: Secondary | ICD-10-CM | POA: Diagnosis not present

## 2020-10-08 DIAGNOSIS — S8992XA Unspecified injury of left lower leg, initial encounter: Secondary | ICD-10-CM | POA: Diagnosis not present

## 2020-10-08 DIAGNOSIS — M7989 Other specified soft tissue disorders: Secondary | ICD-10-CM | POA: Diagnosis not present

## 2020-10-08 DIAGNOSIS — E785 Hyperlipidemia, unspecified: Secondary | ICD-10-CM | POA: Diagnosis not present

## 2020-10-08 DIAGNOSIS — I10 Essential (primary) hypertension: Secondary | ICD-10-CM | POA: Diagnosis not present

## 2020-10-08 DIAGNOSIS — Z79899 Other long term (current) drug therapy: Secondary | ICD-10-CM | POA: Diagnosis not present

## 2020-10-08 DIAGNOSIS — E119 Type 2 diabetes mellitus without complications: Secondary | ICD-10-CM | POA: Diagnosis not present

## 2020-10-09 DIAGNOSIS — E119 Type 2 diabetes mellitus without complications: Secondary | ICD-10-CM | POA: Diagnosis not present

## 2020-10-09 DIAGNOSIS — D649 Anemia, unspecified: Secondary | ICD-10-CM | POA: Diagnosis not present

## 2020-10-09 DIAGNOSIS — E785 Hyperlipidemia, unspecified: Secondary | ICD-10-CM | POA: Diagnosis not present

## 2020-10-09 DIAGNOSIS — I10 Essential (primary) hypertension: Secondary | ICD-10-CM | POA: Diagnosis not present

## 2020-10-09 DIAGNOSIS — F419 Anxiety disorder, unspecified: Secondary | ICD-10-CM | POA: Diagnosis not present

## 2020-10-09 DIAGNOSIS — I959 Hypotension, unspecified: Secondary | ICD-10-CM | POA: Diagnosis not present

## 2020-10-09 DIAGNOSIS — N189 Chronic kidney disease, unspecified: Secondary | ICD-10-CM | POA: Diagnosis not present

## 2020-10-09 DIAGNOSIS — I509 Heart failure, unspecified: Secondary | ICD-10-CM | POA: Diagnosis not present

## 2020-10-10 DIAGNOSIS — E119 Type 2 diabetes mellitus without complications: Secondary | ICD-10-CM | POA: Diagnosis not present

## 2020-10-10 DIAGNOSIS — E785 Hyperlipidemia, unspecified: Secondary | ICD-10-CM | POA: Diagnosis not present

## 2020-10-10 DIAGNOSIS — I10 Essential (primary) hypertension: Secondary | ICD-10-CM | POA: Diagnosis not present

## 2020-10-10 DIAGNOSIS — I509 Heart failure, unspecified: Secondary | ICD-10-CM | POA: Diagnosis not present

## 2020-10-10 DIAGNOSIS — N189 Chronic kidney disease, unspecified: Secondary | ICD-10-CM | POA: Diagnosis not present

## 2020-10-10 DIAGNOSIS — F419 Anxiety disorder, unspecified: Secondary | ICD-10-CM | POA: Diagnosis not present

## 2020-10-10 DIAGNOSIS — D649 Anemia, unspecified: Secondary | ICD-10-CM | POA: Diagnosis not present

## 2020-10-11 DIAGNOSIS — I10 Essential (primary) hypertension: Secondary | ICD-10-CM | POA: Diagnosis not present

## 2020-10-11 DIAGNOSIS — G8929 Other chronic pain: Secondary | ICD-10-CM | POA: Diagnosis not present

## 2020-10-11 DIAGNOSIS — F0390 Unspecified dementia without behavioral disturbance: Secondary | ICD-10-CM | POA: Diagnosis not present

## 2020-10-11 DIAGNOSIS — H353132 Nonexudative age-related macular degeneration, bilateral, intermediate dry stage: Secondary | ICD-10-CM | POA: Diagnosis not present

## 2020-10-11 DIAGNOSIS — N289 Disorder of kidney and ureter, unspecified: Secondary | ICD-10-CM | POA: Diagnosis not present

## 2020-10-11 DIAGNOSIS — I5032 Chronic diastolic (congestive) heart failure: Secondary | ICD-10-CM | POA: Diagnosis not present

## 2020-10-11 DIAGNOSIS — E119 Type 2 diabetes mellitus without complications: Secondary | ICD-10-CM | POA: Diagnosis not present

## 2020-10-11 DIAGNOSIS — I4891 Unspecified atrial fibrillation: Secondary | ICD-10-CM | POA: Diagnosis not present

## 2020-10-11 DIAGNOSIS — F419 Anxiety disorder, unspecified: Secondary | ICD-10-CM | POA: Diagnosis not present

## 2020-10-11 DIAGNOSIS — Z79899 Other long term (current) drug therapy: Secondary | ICD-10-CM | POA: Diagnosis not present

## 2020-10-11 DIAGNOSIS — Z7401 Bed confinement status: Secondary | ICD-10-CM | POA: Diagnosis not present

## 2020-10-11 DIAGNOSIS — R5381 Other malaise: Secondary | ICD-10-CM | POA: Diagnosis not present

## 2020-10-11 DIAGNOSIS — I110 Hypertensive heart disease with heart failure: Secondary | ICD-10-CM | POA: Diagnosis not present

## 2020-10-11 DIAGNOSIS — I509 Heart failure, unspecified: Secondary | ICD-10-CM | POA: Diagnosis not present

## 2020-10-11 DIAGNOSIS — K921 Melena: Secondary | ICD-10-CM | POA: Diagnosis not present

## 2020-10-11 DIAGNOSIS — I4821 Permanent atrial fibrillation: Secondary | ICD-10-CM | POA: Diagnosis not present

## 2020-10-11 DIAGNOSIS — G301 Alzheimer's disease with late onset: Secondary | ICD-10-CM | POA: Diagnosis not present

## 2020-10-11 DIAGNOSIS — R41841 Cognitive communication deficit: Secondary | ICD-10-CM | POA: Diagnosis not present

## 2020-10-11 DIAGNOSIS — R278 Other lack of coordination: Secondary | ICD-10-CM | POA: Diagnosis not present

## 2020-10-11 DIAGNOSIS — M6281 Muscle weakness (generalized): Secondary | ICD-10-CM | POA: Diagnosis not present

## 2020-10-11 DIAGNOSIS — E46 Unspecified protein-calorie malnutrition: Secondary | ICD-10-CM | POA: Diagnosis not present

## 2020-10-11 DIAGNOSIS — R531 Weakness: Secondary | ICD-10-CM | POA: Diagnosis not present

## 2020-10-11 DIAGNOSIS — N189 Chronic kidney disease, unspecified: Secondary | ICD-10-CM | POA: Diagnosis not present

## 2020-10-11 DIAGNOSIS — I480 Paroxysmal atrial fibrillation: Secondary | ICD-10-CM | POA: Diagnosis not present

## 2020-10-11 DIAGNOSIS — Z961 Presence of intraocular lens: Secondary | ICD-10-CM | POA: Diagnosis not present

## 2020-10-11 DIAGNOSIS — K922 Gastrointestinal hemorrhage, unspecified: Secondary | ICD-10-CM | POA: Diagnosis not present

## 2020-10-11 DIAGNOSIS — I959 Hypotension, unspecified: Secondary | ICD-10-CM | POA: Diagnosis not present

## 2020-10-11 DIAGNOSIS — E785 Hyperlipidemia, unspecified: Secondary | ICD-10-CM | POA: Diagnosis not present

## 2020-10-11 DIAGNOSIS — D649 Anemia, unspecified: Secondary | ICD-10-CM | POA: Diagnosis not present

## 2020-10-11 DIAGNOSIS — F29 Unspecified psychosis not due to a substance or known physiological condition: Secondary | ICD-10-CM | POA: Diagnosis not present

## 2020-10-11 DIAGNOSIS — E876 Hypokalemia: Secondary | ICD-10-CM | POA: Diagnosis not present

## 2020-10-11 DIAGNOSIS — R1310 Dysphagia, unspecified: Secondary | ICD-10-CM | POA: Diagnosis not present

## 2020-10-14 DIAGNOSIS — K922 Gastrointestinal hemorrhage, unspecified: Secondary | ICD-10-CM | POA: Diagnosis not present

## 2020-10-14 DIAGNOSIS — I10 Essential (primary) hypertension: Secondary | ICD-10-CM | POA: Diagnosis not present

## 2020-10-14 DIAGNOSIS — G8929 Other chronic pain: Secondary | ICD-10-CM | POA: Diagnosis not present

## 2020-10-14 DIAGNOSIS — F0390 Unspecified dementia without behavioral disturbance: Secondary | ICD-10-CM | POA: Diagnosis not present

## 2020-10-14 DIAGNOSIS — I4891 Unspecified atrial fibrillation: Secondary | ICD-10-CM | POA: Diagnosis not present

## 2020-10-14 DIAGNOSIS — E119 Type 2 diabetes mellitus without complications: Secondary | ICD-10-CM | POA: Diagnosis not present

## 2020-10-14 DIAGNOSIS — I5032 Chronic diastolic (congestive) heart failure: Secondary | ICD-10-CM | POA: Diagnosis not present

## 2020-10-23 ENCOUNTER — Telehealth: Payer: Self-pay | Admitting: Family Medicine

## 2020-10-23 NOTE — Telephone Encounter (Signed)
New Message    Patient daughter called wants call back, her mother was in hospital for a GI Bleed and she wants to know who stopped her mother Eliquis and when is she suppose to resume taking it ?  Her mother is in the [facility] and they are not giving her the Eliquis

## 2020-10-23 NOTE — Telephone Encounter (Signed)
Advised that its reasonable to hold eliquis for GI bleed until patient is stable to restart. Advised that she can discuss this with Dr. Harl Bowie at patient's visit on 11/06/20. Verbalized understanding.

## 2020-10-24 ENCOUNTER — Other Ambulatory Visit: Payer: Self-pay | Admitting: *Deleted

## 2020-10-24 NOTE — Patient Outreach (Signed)
Member screened for potential [facility] Care Management needs.   [name] resides in [facility]. Update received from SNF SW indicating member's transition plan is for long term care.   No identifiable [facility] Care Management needs at this time.     Amanda Rolling, MSN, RN,BSN [place] Acute Care Coordinator [phone] [facility]) [phone]  (Toll free office)

## 2020-10-31 DIAGNOSIS — F29 Unspecified psychosis not due to a substance or known physiological condition: Secondary | ICD-10-CM | POA: Diagnosis not present

## 2020-10-31 DIAGNOSIS — G8929 Other chronic pain: Secondary | ICD-10-CM | POA: Diagnosis not present

## 2020-10-31 DIAGNOSIS — G301 Alzheimer's disease with late onset: Secondary | ICD-10-CM | POA: Diagnosis not present

## 2020-11-06 ENCOUNTER — Other Ambulatory Visit: Payer: Self-pay

## 2020-11-06 ENCOUNTER — Ambulatory Visit (INDEPENDENT_AMBULATORY_CARE_PROVIDER_SITE_OTHER): Payer: Medicare Other | Admitting: Cardiology

## 2020-11-06 ENCOUNTER — Encounter: Payer: Self-pay | Admitting: Cardiology

## 2020-11-06 VITALS — BP 139/89 | HR 42 | Ht 59.0 in

## 2020-11-06 DIAGNOSIS — I5032 Chronic diastolic (congestive) heart failure: Secondary | ICD-10-CM

## 2020-11-06 DIAGNOSIS — I10 Essential (primary) hypertension: Secondary | ICD-10-CM

## 2020-11-06 DIAGNOSIS — I4891 Unspecified atrial fibrillation: Secondary | ICD-10-CM | POA: Diagnosis not present

## 2020-11-06 MED ORDER — METOPROLOL TARTRATE 100 MG PO TABS: 100.0000 mg | ORAL_TABLET | Freq: Two times a day (BID) | ORAL | 3 refills | Status: AC

## 2020-11-06 NOTE — Progress Notes (Signed)
Clinical Summary Ms. Pickerill is a 87 y.o.female seen today for follow up for the following medical problems.        1. Afib - admit 12/2017 with afib with RVR - started on dilt 120mg  daily, lopressor increased to 100mg  bid.     -palpitations often occur in early AM. Better with xanax.  - no bleeding on eliquis     - cardizem stopped 09/2020 admission with GI bleed, low bp's - eliquis was stopped during that admission. Of note ER visit earlier that month with fall. Fall and ER visit 06/2020 - family reports no repeat falls since being in SNF at [facility].    2. Chronic diastolic HF    - no recent issues   3. Vertigo - managed per pcp, has prn meclizine    4. HTN - compliant with meds - diltiazem stopped during 09/2020 admission with GI bleed, low bp's.     5. GI bleed - admitted 09/2020 [place] was reversed. Initial FOBT +, repeat was negative. Hgb was stable, no additional workup done.      SH: former wife of Glynnis Gavel, also a patient of mine who passed away in 2015-08-26     Past Medical History:  Diagnosis Date   Arthritis    DM type 2 (diabetes mellitus, type 2) ([place])    Dyslipidemia    Hiatal hernia 08-25-12   HTN (hypertension)    Osteoporosis    Reflux esophagitis 25-Aug-2012   erosive   Schatzki's ring 08/25/12   Tubular adenoma      No Known Allergies   Current Outpatient Medications  Medication Sig Dispense Refill   acetaminophen (TYLENOL) 500 MG tablet Take 500 mg by mouth every 8 (eight) hours as needed.     ALPRAZolam (XANAX) 0.5 MG tablet Take 0.5 mg by mouth 3 (three) times daily as needed for anxiety.     apixaban (ELIQUIS) 2.5 MG TABS tablet Take 1 tablet (2.5 mg total) by mouth 2 (two) times daily. 60 tablet 0   ascorbic acid (VITAMIN C) 500 MG tablet Take 500 mg by mouth daily.     calcium carbonate (TUMS - DOSED IN MG ELEMENTAL CALCIUM) 500 MG chewable tablet Chew 1 tablet by mouth every 8 (eight) hours as needed for indigestion  or heartburn.     cholecalciferol (VITAMIN D3) 25 MCG (1000 UNIT) tablet Take 1,000 Units by mouth daily.     Diclofenac Sodium 3 % CREA Apply 1 application topically 2 (two) times daily.     diltiazem (CARDIZEM CD) 120 MG 24 hr capsule Take 120 mg by mouth daily.     donepezil (ARICEPT) 10 MG tablet Take 1 tablet (10 mg total) by mouth at bedtime. 30 tablet 0   furosemide (LASIX) 20 MG tablet Take 1 tablet (20 mg total) by mouth daily as needed (swelling). 90 tablet 3   HYDROcodone-acetaminophen (NORCO/VICODIN) 5-325 MG tablet Take 1 tablet by mouth every 8 (eight) hours as needed for moderate pain.     loperamide (IMODIUM) 2 MG capsule Take 2 mg by mouth daily as needed for diarrhea or loose stools.     meclizine (ANTIVERT) 25 MG tablet Take 1 tablet (25 mg total) by mouth 2 (two) times daily. (Patient taking differently: Take 25 mg by mouth every 6 (six) hours as needed.) 60 tablet 0   memantine (NAMENDA) 5 MG tablet Take 1 tablet (5 mg total) by mouth 2 (two) times daily. 60 tablet  0   metoprolol tartrate (LOPRESSOR) 50 MG tablet TAKE 75 MG IN THE MORNING AND 100 MG IN THE EVENING (Patient taking differently: Take 50 mg by mouth 2 (two) times daily. TAKE 75 MG IN THE MORNING AND 100 MG IN THE EVENING) 315 tablet 1   Multiple Vitamin (MULTIVITAMIN WITH MINERALS) TABS tablet Take 1 tablet by mouth daily.     Multiple Vitamins-Minerals (PRESERVISION AREDS 2 PO) Take 1 tablet by mouth 2 (two) times daily.     potassium chloride (KLOR-CON) 10 MEQ tablet Take 10 mEq by mouth daily.     risperiDONE (RISPERDAL) 0.25 MG tablet Take 1 tablet (0.25 mg total) by mouth at bedtime. 30 tablet 0   zinc gluconate 50 MG tablet Take 50 mg by mouth daily.     No current facility-administered medications for this visit.     Past Surgical History:  Procedure Laterality Date   ABDOMINAL HYSTERECTOMY     APPENDECTOMY     BREAST BIOPSY     COLONOSCOPY N/A 09/08/2012   RMR: tubular adenoma, poor prep, needs  surveillance April 2015    COLONOSCOPY  1990/1992/1996   [facility], multiple hyperplastic polyps   ESOPHAGOGASTRODUODENOSCOPY N/A 10/09/2015   RMR: pyloric stenosis dilated with scope.   ESOPHAGOGASTRODUODENOSCOPY (EGD) WITH ESOPHAGEAL DILATION N/A 09/08/2012   RMR: Schatzki's ring s/p 54- F dilation, erosive esophagitis, hiatal hernia, gastric/bulb erosions with mild pyloric stenosis. Negative path   EYE SURGERY     both   HIP SURGERY     right x 2   RECTAL SURGERY     prolapsed rectum/hemorrhoids     No Known Allergies    Family History  Problem Relation Age of Onset   Hypertension Mother    Hypertension Son    Colon cancer Neg Hx      Social History Ms. Koslosky reports that she has never smoked. She has never used smokeless tobacco. Ms. Stachnik reports no history of alcohol use.   Review of Systems CONSTITUTIONAL: No weight loss, fever, chills, weakness or fatigue.  HEENT: Eyes: No visual loss, blurred vision, double vision or yellow sclerae.No hearing loss, sneezing, congestion, runny nose or sore throat.  SKIN: No rash or itching.  CARDIOVASCULAR: per hpi RESPIRATORY: No shortness of breath, cough or sputum.  GASTROINTESTINAL: No anorexia, nausea, vomiting or diarrhea. No abdominal pain or blood.  GENITOURINARY: No burning on urination, no polyuria NEUROLOGICAL: No headache, dizziness, syncope, paralysis, ataxia, numbness or tingling in the extremities. No change in bowel or bladder control.  MUSCULOSKELETAL: No muscle, back pain, joint pain or stiffness.  LYMPHATICS: No enlarged nodes. No history of splenectomy.  PSYCHIATRIC: No history of depression or anxiety.  ENDOCRINOLOGIC: No reports of sweating, cold or heat intolerance. No polyuria or polydipsia.  Marland Kitchen   Physical Examination Today's Vitals   11/06/20 1438  BP: 139/89  Pulse: (!) 42  Height: 4\' 11"  (1.499 m)   Body mass index is 26.86 kg/m.  Gen: resting comfortably, no acute  distress HEENT: no scleral icterus, pupils equal round and reactive, no palptable cervical adenopathy,  CV: irreg, no mr/g, no jvd Resp: Clear to auscultation bilaterally GI: abdomen is soft, non-tender, non-distended, normal bowel sounds, no hepatosplenomegaly MSK: extremities are warm, no edema.  Skin: warm, no rash Neuro:  no focal deficits Psych: appropriate affect   Diagnostic Studies 07/2015 echo Study Conclusions   - Left ventricle: The cavity size was normal. Wall thickness was   increased in a pattern of mild  LVH. Systolic function was normal.   The estimated ejection fraction was in the range of 60% to 65%.   Wall motion was normal; there were no regional wall motion   abnormalities. Doppler parameters are consistent with abnormal   left ventricular relaxation (grade 1 diastolic dysfunction). - Aortic valve: Moderately calcified annulus. Mildly thickened,   mildly calcified leaflets. - Mitral valve: Calcified annulus. Mildly thickened, mildly   calcified leaflets .     12/2017 echo ------------------------------------------------------------------- Study Conclusions   - Left ventricle: The cavity size was normal. Wall thickness was   normal. Systolic function was normal. The estimated ejection   fraction was in the range of 50% to 55%. Wall motion was normal;   there were no regional wall motion abnormalities. - Aortic valve: Mildly calcified annulus. Trileaflet; mildly   thickened leaflets. Valve area (VTI): 0.87 cm^2. Valve area   (Vmax): 0.97 cm^2. Valve area (Vmean): 0.88 cm^2. - Mitral valve: Mildly calcified annulus. Mildly thickened leaflets   . - Left atrium: The atrium was mildly dilated. - Right atrium: The atrium was mildly dilated. - Atrial septum: No defect or patent foramen ovale was identified.     Assessment and Plan  1. Afib - off eliquis due anemia Hgb 12-->10 over few month  period, during 09/2020 admission FOBT initially positive then repeat  negative. Did not have further testing, eliquis was stopped - multiple recent falls as well - would remain off eliquis. If Hgb stgable over time and no recurrent falls could reconsider. Family reports at [facility] has not had another fall in the last month - diltiazem stopped during 09/2020 admission with GI bleed and low bp's.  - repeat cbc  Dynamap HRs in the 40s, ekg afib 120.Dynamap for her is inaccurate given afib, will need ekg at f/u to reassess rates. We icnreased her lopressor to 100mg  bid.   2. Chronic diastolic  -controlled, continue curren tmeds   3. HTN - at goal, continue current meds   F/u 2 months. If stable CBC and no recurrent falls could consider retrying eliquis. Would need repeat cbc about 2 weeks after starting.      Arnoldo Lenis, M.D.

## 2020-11-06 NOTE — Patient Instructions (Addendum)
Medication Instructions:  Your physician has recommended you make the following change in your medication: Increase metoprolol tartrate to 100 mg by mouth twice daily Continue other medications the same  Labwork: CBC-can be done at the [facility]-fax results to [phone]  Testing/Procedures: none  Follow-Up: Your physician recommends that you schedule a follow-up appointment in: 2 months  Any Other Special Instructions Will Be Listed Below (If Applicable).  If you need a refill on your cardiac medications before your next appointment, please call your pharmacy.

## 2020-11-08 DIAGNOSIS — H353132 Nonexudative age-related macular degeneration, bilateral, intermediate dry stage: Secondary | ICD-10-CM | POA: Diagnosis not present

## 2020-11-08 DIAGNOSIS — Z961 Presence of intraocular lens: Secondary | ICD-10-CM | POA: Diagnosis not present

## 2020-11-12 DIAGNOSIS — N189 Chronic kidney disease, unspecified: Secondary | ICD-10-CM | POA: Diagnosis not present

## 2020-11-12 DIAGNOSIS — E1159 Type 2 diabetes mellitus with other circulatory complications: Secondary | ICD-10-CM | POA: Diagnosis not present

## 2020-12-06 DIAGNOSIS — E119 Type 2 diabetes mellitus without complications: Secondary | ICD-10-CM | POA: Diagnosis not present

## 2020-12-06 DIAGNOSIS — F29 Unspecified psychosis not due to a substance or known physiological condition: Secondary | ICD-10-CM | POA: Diagnosis not present

## 2020-12-06 DIAGNOSIS — K922 Gastrointestinal hemorrhage, unspecified: Secondary | ICD-10-CM | POA: Diagnosis not present

## 2020-12-06 DIAGNOSIS — G8929 Other chronic pain: Secondary | ICD-10-CM | POA: Diagnosis not present

## 2020-12-06 DIAGNOSIS — I10 Essential (primary) hypertension: Secondary | ICD-10-CM | POA: Diagnosis not present

## 2020-12-06 DIAGNOSIS — I4891 Unspecified atrial fibrillation: Secondary | ICD-10-CM | POA: Diagnosis not present

## 2020-12-06 DIAGNOSIS — F0390 Unspecified dementia without behavioral disturbance: Secondary | ICD-10-CM | POA: Diagnosis not present

## 2020-12-06 DIAGNOSIS — G301 Alzheimer's disease with late onset: Secondary | ICD-10-CM | POA: Diagnosis not present

## 2020-12-06 DIAGNOSIS — I5032 Chronic diastolic (congestive) heart failure: Secondary | ICD-10-CM | POA: Diagnosis not present

## 2020-12-12 DIAGNOSIS — F0391 Unspecified dementia with behavioral disturbance: Secondary | ICD-10-CM | POA: Diagnosis not present

## 2020-12-13 DIAGNOSIS — E785 Hyperlipidemia, unspecified: Secondary | ICD-10-CM | POA: Diagnosis not present

## 2020-12-13 DIAGNOSIS — Z20822 Contact with and (suspected) exposure to covid-19: Secondary | ICD-10-CM | POA: Diagnosis not present

## 2020-12-13 DIAGNOSIS — K219 Gastro-esophageal reflux disease without esophagitis: Secondary | ICD-10-CM | POA: Diagnosis not present

## 2020-12-13 DIAGNOSIS — R230 Cyanosis: Secondary | ICD-10-CM | POA: Diagnosis not present

## 2020-12-13 DIAGNOSIS — R404 Transient alteration of awareness: Secondary | ICD-10-CM | POA: Diagnosis not present

## 2020-12-13 DIAGNOSIS — F419 Anxiety disorder, unspecified: Secondary | ICD-10-CM | POA: Diagnosis not present

## 2020-12-13 DIAGNOSIS — E119 Type 2 diabetes mellitus without complications: Secondary | ICD-10-CM | POA: Diagnosis not present

## 2020-12-13 DIAGNOSIS — I110 Hypertensive heart disease with heart failure: Secondary | ICD-10-CM | POA: Diagnosis not present

## 2020-12-13 DIAGNOSIS — N390 Urinary tract infection, site not specified: Secondary | ICD-10-CM | POA: Diagnosis not present

## 2020-12-13 DIAGNOSIS — M79673 Pain in unspecified foot: Secondary | ICD-10-CM | POA: Diagnosis not present

## 2020-12-13 DIAGNOSIS — Z79899 Other long term (current) drug therapy: Secondary | ICD-10-CM | POA: Diagnosis not present

## 2020-12-13 DIAGNOSIS — M810 Age-related osteoporosis without current pathological fracture: Secondary | ICD-10-CM | POA: Diagnosis not present

## 2020-12-13 DIAGNOSIS — I82411 Acute embolism and thrombosis of right femoral vein: Secondary | ICD-10-CM | POA: Diagnosis not present

## 2020-12-13 DIAGNOSIS — K8020 Calculus of gallbladder without cholecystitis without obstruction: Secondary | ICD-10-CM | POA: Diagnosis not present

## 2020-12-13 DIAGNOSIS — D649 Anemia, unspecified: Secondary | ICD-10-CM | POA: Diagnosis not present

## 2020-12-13 DIAGNOSIS — I509 Heart failure, unspecified: Secondary | ICD-10-CM | POA: Diagnosis not present

## 2020-12-13 DIAGNOSIS — I4821 Permanent atrial fibrillation: Secondary | ICD-10-CM | POA: Diagnosis not present

## 2020-12-13 DIAGNOSIS — I743 Embolism and thrombosis of arteries of the lower extremities: Secondary | ICD-10-CM | POA: Diagnosis not present

## 2020-12-13 DIAGNOSIS — Z66 Do not resuscitate: Secondary | ICD-10-CM | POA: Diagnosis not present

## 2020-12-13 DIAGNOSIS — M79604 Pain in right leg: Secondary | ICD-10-CM | POA: Diagnosis not present

## 2020-12-13 DIAGNOSIS — I4891 Unspecified atrial fibrillation: Secondary | ICD-10-CM | POA: Diagnosis not present

## 2020-12-14 DIAGNOSIS — R29898 Other symptoms and signs involving the musculoskeletal system: Secondary | ICD-10-CM | POA: Diagnosis not present

## 2020-12-14 DIAGNOSIS — I959 Hypotension, unspecified: Secondary | ICD-10-CM | POA: Diagnosis not present

## 2020-12-14 DIAGNOSIS — E1151 Type 2 diabetes mellitus with diabetic peripheral angiopathy without gangrene: Secondary | ICD-10-CM | POA: Diagnosis present

## 2020-12-14 DIAGNOSIS — R52 Pain, unspecified: Secondary | ICD-10-CM | POA: Diagnosis present

## 2020-12-14 DIAGNOSIS — R682 Dry mouth, unspecified: Secondary | ICD-10-CM | POA: Diagnosis not present

## 2020-12-14 DIAGNOSIS — I70201 Unspecified atherosclerosis of native arteries of extremities, right leg: Secondary | ICD-10-CM | POA: Diagnosis present

## 2020-12-14 DIAGNOSIS — I110 Hypertensive heart disease with heart failure: Secondary | ICD-10-CM | POA: Diagnosis not present

## 2020-12-14 DIAGNOSIS — Z7189 Other specified counseling: Secondary | ICD-10-CM | POA: Diagnosis not present

## 2020-12-14 DIAGNOSIS — F419 Anxiety disorder, unspecified: Secondary | ICD-10-CM | POA: Diagnosis present

## 2020-12-14 DIAGNOSIS — Z743 Need for continuous supervision: Secondary | ICD-10-CM | POA: Diagnosis not present

## 2020-12-14 DIAGNOSIS — M62261 Nontraumatic ischemic infarction of muscle, right lower leg: Secondary | ICD-10-CM | POA: Diagnosis not present

## 2020-12-14 DIAGNOSIS — E119 Type 2 diabetes mellitus without complications: Secondary | ICD-10-CM | POA: Diagnosis not present

## 2020-12-14 DIAGNOSIS — Z20822 Contact with and (suspected) exposure to covid-19: Secondary | ICD-10-CM | POA: Diagnosis not present

## 2020-12-14 DIAGNOSIS — E785 Hyperlipidemia, unspecified: Secondary | ICD-10-CM | POA: Diagnosis present

## 2020-12-14 DIAGNOSIS — R451 Restlessness and agitation: Secondary | ICD-10-CM | POA: Diagnosis not present

## 2020-12-14 DIAGNOSIS — Z515 Encounter for palliative care: Secondary | ICD-10-CM | POA: Diagnosis not present

## 2020-12-14 DIAGNOSIS — D62 Acute posthemorrhagic anemia: Secondary | ICD-10-CM | POA: Diagnosis not present

## 2020-12-14 DIAGNOSIS — I998 Other disorder of circulatory system: Secondary | ICD-10-CM | POA: Diagnosis not present

## 2020-12-14 DIAGNOSIS — I4811 Longstanding persistent atrial fibrillation: Secondary | ICD-10-CM | POA: Diagnosis not present

## 2020-12-14 DIAGNOSIS — I743 Embolism and thrombosis of arteries of the lower extremities: Secondary | ICD-10-CM | POA: Diagnosis not present

## 2020-12-14 DIAGNOSIS — I4891 Unspecified atrial fibrillation: Secondary | ICD-10-CM | POA: Diagnosis not present

## 2020-12-14 DIAGNOSIS — I10 Essential (primary) hypertension: Secondary | ICD-10-CM | POA: Diagnosis not present

## 2020-12-14 DIAGNOSIS — M810 Age-related osteoporosis without current pathological fracture: Secondary | ICD-10-CM | POA: Diagnosis present

## 2020-12-14 DIAGNOSIS — Z9181 History of falling: Secondary | ICD-10-CM | POA: Diagnosis not present

## 2020-12-14 DIAGNOSIS — H04129 Dry eye syndrome of unspecified lacrimal gland: Secondary | ICD-10-CM | POA: Diagnosis not present

## 2020-12-14 DIAGNOSIS — Z6823 Body mass index (BMI) 23.0-23.9, adult: Secondary | ICD-10-CM | POA: Diagnosis not present

## 2020-12-14 DIAGNOSIS — F0390 Unspecified dementia without behavioral disturbance: Secondary | ICD-10-CM | POA: Diagnosis present

## 2020-12-14 DIAGNOSIS — Z66 Do not resuscitate: Secondary | ICD-10-CM | POA: Diagnosis not present

## 2020-12-14 DIAGNOSIS — Z7401 Bed confinement status: Secondary | ICD-10-CM | POA: Diagnosis not present

## 2020-12-14 DIAGNOSIS — M542 Cervicalgia: Secondary | ICD-10-CM | POA: Diagnosis present

## 2020-12-14 DIAGNOSIS — I4821 Permanent atrial fibrillation: Secondary | ICD-10-CM | POA: Diagnosis not present

## 2020-12-14 DIAGNOSIS — I509 Heart failure, unspecified: Secondary | ICD-10-CM | POA: Diagnosis not present

## 2020-12-14 DIAGNOSIS — R627 Adult failure to thrive: Secondary | ICD-10-CM | POA: Diagnosis not present

## 2020-12-14 DIAGNOSIS — I5032 Chronic diastolic (congestive) heart failure: Secondary | ICD-10-CM | POA: Diagnosis not present

## 2020-12-14 DIAGNOSIS — K219 Gastro-esophageal reflux disease without esophagitis: Secondary | ICD-10-CM | POA: Diagnosis not present

## 2020-12-14 DIAGNOSIS — M79673 Pain in unspecified foot: Secondary | ICD-10-CM | POA: Diagnosis not present

## 2020-12-14 DIAGNOSIS — K5900 Constipation, unspecified: Secondary | ICD-10-CM | POA: Diagnosis not present

## 2020-12-14 DIAGNOSIS — R339 Retention of urine, unspecified: Secondary | ICD-10-CM | POA: Diagnosis not present

## 2020-12-15 DIAGNOSIS — I509 Heart failure, unspecified: Secondary | ICD-10-CM | POA: Diagnosis not present

## 2020-12-15 DIAGNOSIS — K219 Gastro-esophageal reflux disease without esophagitis: Secondary | ICD-10-CM | POA: Diagnosis not present

## 2020-12-15 DIAGNOSIS — E119 Type 2 diabetes mellitus without complications: Secondary | ICD-10-CM | POA: Diagnosis not present

## 2020-12-15 DIAGNOSIS — I998 Other disorder of circulatory system: Secondary | ICD-10-CM | POA: Diagnosis not present

## 2020-12-15 DIAGNOSIS — Z66 Do not resuscitate: Secondary | ICD-10-CM | POA: Diagnosis not present

## 2020-12-15 DIAGNOSIS — I4811 Longstanding persistent atrial fibrillation: Secondary | ICD-10-CM | POA: Diagnosis not present

## 2020-12-15 DIAGNOSIS — I110 Hypertensive heart disease with heart failure: Secondary | ICD-10-CM | POA: Diagnosis not present

## 2020-12-15 DIAGNOSIS — E785 Hyperlipidemia, unspecified: Secondary | ICD-10-CM | POA: Diagnosis not present

## 2020-12-15 DIAGNOSIS — I5032 Chronic diastolic (congestive) heart failure: Secondary | ICD-10-CM | POA: Diagnosis not present

## 2020-12-15 DIAGNOSIS — Z515 Encounter for palliative care: Secondary | ICD-10-CM | POA: Diagnosis not present

## 2020-12-15 DIAGNOSIS — Z20822 Contact with and (suspected) exposure to covid-19: Secondary | ICD-10-CM | POA: Diagnosis not present

## 2020-12-15 DIAGNOSIS — R339 Retention of urine, unspecified: Secondary | ICD-10-CM | POA: Diagnosis not present

## 2020-12-15 DIAGNOSIS — I743 Embolism and thrombosis of arteries of the lower extremities: Secondary | ICD-10-CM | POA: Diagnosis not present

## 2020-12-15 DIAGNOSIS — F419 Anxiety disorder, unspecified: Secondary | ICD-10-CM | POA: Diagnosis not present

## 2020-12-15 DIAGNOSIS — R52 Pain, unspecified: Secondary | ICD-10-CM | POA: Diagnosis not present

## 2020-12-15 DIAGNOSIS — D62 Acute posthemorrhagic anemia: Secondary | ICD-10-CM | POA: Diagnosis not present

## 2020-12-16 DIAGNOSIS — I998 Other disorder of circulatory system: Secondary | ICD-10-CM | POA: Diagnosis not present

## 2020-12-16 DIAGNOSIS — I4811 Longstanding persistent atrial fibrillation: Secondary | ICD-10-CM | POA: Diagnosis not present

## 2020-12-17 DIAGNOSIS — I998 Other disorder of circulatory system: Secondary | ICD-10-CM | POA: Diagnosis not present

## 2020-12-17 DIAGNOSIS — I4811 Longstanding persistent atrial fibrillation: Secondary | ICD-10-CM | POA: Diagnosis not present

## 2020-12-18 DIAGNOSIS — I998 Other disorder of circulatory system: Secondary | ICD-10-CM | POA: Diagnosis not present

## 2020-12-18 DIAGNOSIS — I4811 Longstanding persistent atrial fibrillation: Secondary | ICD-10-CM | POA: Diagnosis not present

## 2020-12-19 DIAGNOSIS — I4811 Longstanding persistent atrial fibrillation: Secondary | ICD-10-CM | POA: Diagnosis not present

## 2020-12-19 DIAGNOSIS — I998 Other disorder of circulatory system: Secondary | ICD-10-CM | POA: Diagnosis not present

## 2020-12-20 DIAGNOSIS — I4811 Longstanding persistent atrial fibrillation: Secondary | ICD-10-CM | POA: Diagnosis not present

## 2020-12-20 DIAGNOSIS — I998 Other disorder of circulatory system: Secondary | ICD-10-CM | POA: Diagnosis not present

## 2020-12-22 DIAGNOSIS — Z7189 Other specified counseling: Secondary | ICD-10-CM | POA: Diagnosis not present

## 2020-12-22 DIAGNOSIS — F419 Anxiety disorder, unspecified: Secondary | ICD-10-CM | POA: Diagnosis not present

## 2020-12-22 DIAGNOSIS — I4811 Longstanding persistent atrial fibrillation: Secondary | ICD-10-CM | POA: Diagnosis not present

## 2020-12-22 DIAGNOSIS — Z515 Encounter for palliative care: Secondary | ICD-10-CM | POA: Diagnosis not present

## 2020-12-22 DIAGNOSIS — R52 Pain, unspecified: Secondary | ICD-10-CM | POA: Diagnosis not present

## 2020-12-22 DIAGNOSIS — I998 Other disorder of circulatory system: Secondary | ICD-10-CM | POA: Diagnosis not present

## 2020-12-23 DIAGNOSIS — I4811 Longstanding persistent atrial fibrillation: Secondary | ICD-10-CM | POA: Diagnosis not present

## 2020-12-23 DIAGNOSIS — Z515 Encounter for palliative care: Secondary | ICD-10-CM | POA: Diagnosis not present

## 2020-12-23 DIAGNOSIS — Z7189 Other specified counseling: Secondary | ICD-10-CM | POA: Diagnosis not present

## 2020-12-23 DIAGNOSIS — R52 Pain, unspecified: Secondary | ICD-10-CM | POA: Diagnosis not present

## 2020-12-23 DIAGNOSIS — F419 Anxiety disorder, unspecified: Secondary | ICD-10-CM | POA: Diagnosis not present

## 2020-12-23 DIAGNOSIS — I998 Other disorder of circulatory system: Secondary | ICD-10-CM | POA: Diagnosis not present

## 2020-12-24 DIAGNOSIS — Z515 Encounter for palliative care: Secondary | ICD-10-CM | POA: Diagnosis not present

## 2020-12-24 DIAGNOSIS — I998 Other disorder of circulatory system: Secondary | ICD-10-CM | POA: Diagnosis not present

## 2020-12-24 DIAGNOSIS — I4811 Longstanding persistent atrial fibrillation: Secondary | ICD-10-CM | POA: Diagnosis not present

## 2020-12-24 DIAGNOSIS — R52 Pain, unspecified: Secondary | ICD-10-CM | POA: Diagnosis not present

## 2020-12-24 DIAGNOSIS — F419 Anxiety disorder, unspecified: Secondary | ICD-10-CM | POA: Diagnosis not present

## 2020-12-25 DIAGNOSIS — I4811 Longstanding persistent atrial fibrillation: Secondary | ICD-10-CM | POA: Diagnosis not present

## 2020-12-25 DIAGNOSIS — K219 Gastro-esophageal reflux disease without esophagitis: Secondary | ICD-10-CM | POA: Diagnosis not present

## 2020-12-25 DIAGNOSIS — R682 Dry mouth, unspecified: Secondary | ICD-10-CM | POA: Diagnosis not present

## 2020-12-25 DIAGNOSIS — I509 Heart failure, unspecified: Secondary | ICD-10-CM | POA: Diagnosis not present

## 2020-12-25 DIAGNOSIS — Z743 Need for continuous supervision: Secondary | ICD-10-CM | POA: Diagnosis not present

## 2020-12-25 DIAGNOSIS — M810 Age-related osteoporosis without current pathological fracture: Secondary | ICD-10-CM | POA: Diagnosis not present

## 2020-12-25 DIAGNOSIS — M62261 Nontraumatic ischemic infarction of muscle, right lower leg: Secondary | ICD-10-CM | POA: Diagnosis not present

## 2020-12-25 DIAGNOSIS — E119 Type 2 diabetes mellitus without complications: Secondary | ICD-10-CM | POA: Diagnosis not present

## 2020-12-25 DIAGNOSIS — H04129 Dry eye syndrome of unspecified lacrimal gland: Secondary | ICD-10-CM | POA: Diagnosis not present

## 2020-12-25 DIAGNOSIS — F419 Anxiety disorder, unspecified: Secondary | ICD-10-CM | POA: Diagnosis not present

## 2020-12-25 DIAGNOSIS — R52 Pain, unspecified: Secondary | ICD-10-CM | POA: Diagnosis not present

## 2020-12-25 DIAGNOSIS — I10 Essential (primary) hypertension: Secondary | ICD-10-CM | POA: Diagnosis not present

## 2020-12-25 DIAGNOSIS — R29898 Other symptoms and signs involving the musculoskeletal system: Secondary | ICD-10-CM | POA: Diagnosis not present

## 2020-12-25 DIAGNOSIS — E785 Hyperlipidemia, unspecified: Secondary | ICD-10-CM | POA: Diagnosis not present

## 2020-12-25 DIAGNOSIS — Z515 Encounter for palliative care: Secondary | ICD-10-CM | POA: Diagnosis not present

## 2020-12-25 DIAGNOSIS — K5900 Constipation, unspecified: Secondary | ICD-10-CM | POA: Diagnosis not present

## 2020-12-25 DIAGNOSIS — I998 Other disorder of circulatory system: Secondary | ICD-10-CM | POA: Diagnosis not present

## 2020-12-25 DIAGNOSIS — I4891 Unspecified atrial fibrillation: Secondary | ICD-10-CM | POA: Diagnosis not present

## 2020-12-26 DIAGNOSIS — I4891 Unspecified atrial fibrillation: Secondary | ICD-10-CM | POA: Diagnosis not present

## 2020-12-26 DIAGNOSIS — M62261 Nontraumatic ischemic infarction of muscle, right lower leg: Secondary | ICD-10-CM | POA: Diagnosis not present

## 2020-12-26 DIAGNOSIS — I509 Heart failure, unspecified: Secondary | ICD-10-CM | POA: Diagnosis not present

## 2020-12-26 DIAGNOSIS — F419 Anxiety disorder, unspecified: Secondary | ICD-10-CM | POA: Diagnosis not present

## 2020-12-26 DIAGNOSIS — I10 Essential (primary) hypertension: Secondary | ICD-10-CM | POA: Diagnosis not present

## 2020-12-26 DIAGNOSIS — E119 Type 2 diabetes mellitus without complications: Secondary | ICD-10-CM | POA: Diagnosis not present

## 2021-01-13 ENCOUNTER — Ambulatory Visit: Payer: Medicare Other | Admitting: Family Medicine

## 2021-01-16 DEATH — deceased
